# Patient Record
Sex: Male | Born: 1975 | Race: White | Hispanic: No | Marital: Single | Smoking: Current every day smoker
Health system: Southern US, Community
[De-identification: ages and names within clinical notes are randomized; demographics above are authoritative.]

## PROBLEM LIST (undated history)

## (undated) DIAGNOSIS — K8020 Calculus of gallbladder without cholecystitis without obstruction: Secondary | ICD-10-CM

## (undated) DIAGNOSIS — F319 Bipolar disorder, unspecified: Secondary | ICD-10-CM

## (undated) DIAGNOSIS — F250 Schizoaffective disorder, bipolar type: Secondary | ICD-10-CM

## (undated) DIAGNOSIS — K859 Acute pancreatitis without necrosis or infection, unspecified: Secondary | ICD-10-CM

## (undated) DIAGNOSIS — J449 Chronic obstructive pulmonary disease, unspecified: Secondary | ICD-10-CM

## (undated) DIAGNOSIS — F259 Schizoaffective disorder, unspecified: Secondary | ICD-10-CM

## (undated) HISTORY — PX: APPENDECTOMY: SHX54

## (undated) HISTORY — PX: MULTIPLE TOOTH EXTRACTIONS: SHX2053

## (undated) HISTORY — DX: Acute pancreatitis without necrosis or infection, unspecified: K85.90

---

## 2003-11-03 ENCOUNTER — Inpatient Hospital Stay (HOSPITAL_COMMUNITY): Admission: EM | Admit: 2003-11-03 | Discharge: 2003-11-11 | Payer: Self-pay | Admitting: Psychiatry

## 2004-02-02 ENCOUNTER — Ambulatory Visit: Payer: Self-pay | Admitting: Psychiatry

## 2004-02-02 ENCOUNTER — Inpatient Hospital Stay (HOSPITAL_COMMUNITY): Admission: AD | Admit: 2004-02-02 | Discharge: 2004-02-12 | Payer: Self-pay | Admitting: Psychiatry

## 2005-03-26 ENCOUNTER — Encounter: Admission: RE | Admit: 2005-03-26 | Discharge: 2005-04-22 | Payer: Self-pay | Admitting: Family Medicine

## 2005-04-23 ENCOUNTER — Encounter: Admission: RE | Admit: 2005-04-23 | Discharge: 2005-04-25 | Payer: Self-pay | Admitting: Family Medicine

## 2006-01-05 ENCOUNTER — Emergency Department (HOSPITAL_COMMUNITY): Admission: EM | Admit: 2006-01-05 | Discharge: 2006-01-05 | Payer: Self-pay | Admitting: Emergency Medicine

## 2006-06-28 ENCOUNTER — Emergency Department (HOSPITAL_COMMUNITY): Admission: EM | Admit: 2006-06-28 | Discharge: 2006-06-28 | Payer: Self-pay | Admitting: Emergency Medicine

## 2006-07-10 ENCOUNTER — Emergency Department (HOSPITAL_COMMUNITY): Admission: EM | Admit: 2006-07-10 | Discharge: 2006-07-11 | Payer: Self-pay | Admitting: Emergency Medicine

## 2006-07-11 ENCOUNTER — Ambulatory Visit: Payer: Self-pay | Admitting: Internal Medicine

## 2006-07-11 HISTORY — PX: ESOPHAGOGASTRODUODENOSCOPY: SHX1529

## 2006-08-08 ENCOUNTER — Emergency Department (HOSPITAL_COMMUNITY): Admission: EM | Admit: 2006-08-08 | Discharge: 2006-08-08 | Payer: Self-pay | Admitting: Emergency Medicine

## 2007-06-25 ENCOUNTER — Emergency Department (HOSPITAL_COMMUNITY): Admission: EM | Admit: 2007-06-25 | Discharge: 2007-06-25 | Payer: Self-pay | Admitting: Emergency Medicine

## 2007-06-27 ENCOUNTER — Emergency Department (HOSPITAL_COMMUNITY): Admission: EM | Admit: 2007-06-27 | Discharge: 2007-06-27 | Payer: Self-pay | Admitting: Emergency Medicine

## 2007-07-23 ENCOUNTER — Emergency Department (HOSPITAL_COMMUNITY): Admission: EM | Admit: 2007-07-23 | Discharge: 2007-07-23 | Payer: Self-pay | Admitting: Emergency Medicine

## 2007-09-20 ENCOUNTER — Emergency Department (HOSPITAL_COMMUNITY): Admission: EM | Admit: 2007-09-20 | Discharge: 2007-09-20 | Payer: Self-pay | Admitting: Emergency Medicine

## 2008-06-20 ENCOUNTER — Emergency Department (HOSPITAL_COMMUNITY): Admission: EM | Admit: 2008-06-20 | Discharge: 2008-06-21 | Payer: Self-pay | Admitting: Emergency Medicine

## 2008-07-05 ENCOUNTER — Ambulatory Visit (HOSPITAL_COMMUNITY): Admission: RE | Admit: 2008-07-05 | Discharge: 2008-07-05 | Payer: Self-pay | Admitting: Preventative Medicine

## 2009-02-21 ENCOUNTER — Emergency Department (HOSPITAL_COMMUNITY): Admission: EM | Admit: 2009-02-21 | Discharge: 2009-02-21 | Payer: Self-pay | Admitting: Emergency Medicine

## 2009-03-01 ENCOUNTER — Emergency Department (HOSPITAL_COMMUNITY): Admission: EM | Admit: 2009-03-01 | Discharge: 2009-03-01 | Payer: Self-pay | Admitting: Emergency Medicine

## 2009-07-04 ENCOUNTER — Emergency Department (HOSPITAL_COMMUNITY): Admission: EM | Admit: 2009-07-04 | Discharge: 2009-07-04 | Payer: Self-pay | Admitting: Emergency Medicine

## 2009-07-18 ENCOUNTER — Emergency Department (HOSPITAL_COMMUNITY): Admission: EM | Admit: 2009-07-18 | Discharge: 2009-07-19 | Payer: Self-pay | Admitting: Emergency Medicine

## 2009-07-26 ENCOUNTER — Emergency Department (HOSPITAL_COMMUNITY): Admission: EM | Admit: 2009-07-26 | Discharge: 2009-07-26 | Payer: Self-pay | Admitting: Emergency Medicine

## 2009-12-24 ENCOUNTER — Emergency Department (HOSPITAL_COMMUNITY): Admission: EM | Admit: 2009-12-24 | Discharge: 2009-12-24 | Payer: Self-pay | Admitting: Emergency Medicine

## 2010-01-17 ENCOUNTER — Emergency Department (HOSPITAL_COMMUNITY)
Admission: EM | Admit: 2010-01-17 | Discharge: 2010-01-17 | Payer: Self-pay | Source: Home / Self Care | Admitting: Emergency Medicine

## 2010-02-25 ENCOUNTER — Emergency Department (HOSPITAL_COMMUNITY): Admission: EM | Admit: 2010-02-25 | Discharge: 2010-02-25 | Payer: Self-pay | Admitting: Emergency Medicine

## 2010-05-09 ENCOUNTER — Emergency Department (HOSPITAL_COMMUNITY)
Admission: EM | Admit: 2010-05-09 | Discharge: 2010-05-09 | Disposition: A | Discharge status: Home / Self Care | Payer: Medicare Other | Attending: Emergency Medicine | Admitting: Emergency Medicine

## 2010-05-09 DIAGNOSIS — R51 Headache: Secondary | ICD-10-CM | POA: Insufficient documentation

## 2010-06-21 LAB — LIPASE, BLOOD: Lipase: 68 U/L — ABNORMAL HIGH (ref 11–59)

## 2010-06-21 LAB — COMPREHENSIVE METABOLIC PANEL
ALT: 11 U/L (ref 0–53)
BUN: 6 mg/dL (ref 6–23)
CO2: 28 mEq/L (ref 19–32)
Calcium: 8.7 mg/dL (ref 8.4–10.5)
Calcium: 8.8 mg/dL (ref 8.4–10.5)
Chloride: 107 mEq/L (ref 96–112)
Creatinine, Ser: 1.1 mg/dL (ref 0.4–1.5)
GFR calc non Af Amer: >60 mL/min (ref >60)
Glucose, Bld: 100 mg/dL — ABNORMAL HIGH (ref 70–99)
Glucose, Bld: 97 mg/dL (ref 70–99)
Sodium: 137 mEq/L (ref 135–145)
Sodium: 139 mEq/L (ref 135–145)
Total Protein: 6.3 g/dL (ref 6.0–8.3)
Total Protein: 6.7 g/dL (ref 6.0–8.3)

## 2010-06-21 LAB — CBC
HCT: 41.5 % (ref 39.0–52.0)
Hemoglobin: 14.7 g/dL (ref 13.0–17.0)
MCH: 32.4 pg (ref 26.0–34.0)
MCH: 32.6 pg (ref 26.0–34.0)
MCHC: 35.2 g/dL (ref 30.0–36.0)
MCHC: 35.3 g/dL (ref 30.0–36.0)
MCV: 92.3 fL (ref 78.0–100.0)
Platelets: 131 10*3/uL — ABNORMAL LOW (ref 150–400)
RBC: 4.5 MIL/uL (ref 4.22–5.81)

## 2010-06-21 LAB — DIFFERENTIAL
Eosinophils Absolute: 0.1 10*3/uL (ref 0.0–0.7)
Eosinophils Absolute: 0.1 10*3/uL (ref 0.0–0.7)
Eosinophils Relative: 2 % (ref 0–5)
Lymphocytes Relative: 21 % (ref 12–46)
Lymphocytes Relative: 27 % (ref 12–46)
Lymphs Abs: 1.4 10*3/uL (ref 0.7–4.0)
Lymphs Abs: 2 10*3/uL (ref 0.7–4.0)
Monocytes Relative: 10 % (ref 3–12)
Neutro Abs: 4.5 10*3/uL (ref 1.7–7.7)
Neutrophils Relative %: 62 % (ref 43–77)
Neutrophils Relative %: 66 % (ref 43–77)

## 2010-06-21 LAB — URINALYSIS, ROUTINE W REFLEX MICROSCOPIC
Bilirubin Urine: NEGATIVE
Hgb urine dipstick: NEGATIVE
Ketones, ur: NEGATIVE mg/dL
Protein, ur: NEGATIVE mg/dL
Urobilinogen, UA: 0.2 mg/dL (ref 0.0–1.0)
pH: 6.5 (ref 5.0–8.0)

## 2010-06-27 LAB — DIFFERENTIAL
Basophils Absolute: 0.1 10*3/uL (ref 0.0–0.1)
Basophils Relative: 0 % (ref 0–1)
Eosinophils Absolute: 0.1 10*3/uL (ref 0.0–0.7)
Eosinophils Relative: 1 % (ref 0–5)
Lymphocytes Relative: 12 % (ref 12–46)
Lymphs Abs: 1.8 10*3/uL (ref 0.7–4.0)
Monocytes Absolute: 0.9 10*3/uL (ref 0.1–1.0)
Monocytes Relative: 6 % (ref 3–12)
Neutro Abs: 12.2 10*3/uL — ABNORMAL HIGH (ref 1.7–7.7)
Neutrophils Relative %: 81 % — ABNORMAL HIGH (ref 43–77)

## 2010-06-27 LAB — BASIC METABOLIC PANEL
BUN: 7 mg/dL (ref 6–23)
CO2: 28 mEq/L (ref 19–32)
Calcium: 8.6 mg/dL (ref 8.4–10.5)
Chloride: 105 mEq/L (ref 96–112)
Creatinine, Ser: 1.06 mg/dL (ref 0.4–1.5)
GFR calc Af Amer: >60 mL/min (ref >60)
GFR calc non Af Amer: >60 mL/min (ref >60)
Glucose, Bld: 93 mg/dL (ref 70–99)
Potassium: 3.4 mEq/L — ABNORMAL LOW (ref 3.5–5.1)
Sodium: 139 mEq/L (ref 135–145)

## 2010-06-27 LAB — CBC
Hemoglobin: 15.4 g/dL (ref 13.0–17.0)
MCHC: 36.3 g/dL — ABNORMAL HIGH (ref 30.0–36.0)
MCV: 88.5 fL (ref 78.0–100.0)
RBC: 4.77 MIL/uL (ref 4.22–5.81)
WBC: 15.1 10*3/uL — ABNORMAL HIGH (ref 4.0–10.5)

## 2010-07-01 LAB — URINALYSIS, ROUTINE W REFLEX MICROSCOPIC
Bilirubin Urine: NEGATIVE
Glucose, UA: NEGATIVE mg/dL
Hgb urine dipstick: NEGATIVE
Protein, ur: NEGATIVE mg/dL
Specific Gravity, Urine: 1.025 (ref 1.005–1.030)

## 2010-07-11 LAB — URINALYSIS, ROUTINE W REFLEX MICROSCOPIC
Glucose, UA: NEGATIVE mg/dL
Hgb urine dipstick: NEGATIVE
Leukocytes, UA: NEGATIVE
pH: 6.5 (ref 5.0–8.0)

## 2010-07-11 LAB — URINE MICROSCOPIC-ADD ON

## 2010-07-19 LAB — DIFFERENTIAL
Basophils Absolute: 0.1 10*3/uL (ref 0.0–0.1)
Lymphocytes Relative: 15 % (ref 12–46)
Lymphs Abs: 1.7 10*3/uL (ref 0.7–4.0)
Monocytes Absolute: 0.7 10*3/uL (ref 0.1–1.0)
Monocytes Relative: 6 % (ref 3–12)
Neutro Abs: 9.4 10*3/uL — ABNORMAL HIGH (ref 1.7–7.7)

## 2010-07-19 LAB — CBC
Hemoglobin: 14.7 g/dL (ref 13.0–17.0)
RBC: 4.47 MIL/uL (ref 4.22–5.81)
RDW: 12 % (ref 11.5–15.5)
WBC: 12 10*3/uL — ABNORMAL HIGH (ref 4.0–10.5)

## 2010-07-19 LAB — BASIC METABOLIC PANEL
Calcium: 8.5 mg/dL (ref 8.4–10.5)
GFR calc Af Amer: >60 mL/min (ref >60)
GFR calc non Af Amer: >60 mL/min (ref >60)
Sodium: 137 mEq/L (ref 135–145)

## 2010-07-19 LAB — URINALYSIS, ROUTINE W REFLEX MICROSCOPIC
Bilirubin Urine: NEGATIVE
Nitrite: NEGATIVE
Specific Gravity, Urine: 1.01 (ref 1.005–1.030)
pH: 5.5 (ref 5.0–8.0)
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
## 2010-08-24 NOTE — Op Note (Signed)
Isaiah Peterson, Isaiah Peterson NO.:  [id number]   MEDICAL RECORD NO.:  [id number]          PATIENT TYPE:  EMS   LOCATION:  ED                            FACILITY:  [facility]   PHYSICIAN:  R. Roetta Sessions, M.D. DATE OF BIRTH:  August 03, 1975   DATE OF PROCEDURE:  07/11/2006  DATE OF DISCHARGE:                               OPERATIVE REPORT   PROCEDURE:  Emergency esophagogastroduodenoscopy with removal of foreign  body (chicken bone).   INDICATIONS FOR PROCEDURE:  Patient is a 35 year old Caucasian male who  went to [employer] this evening and started eating his chicken meal.  He  was eating a breast and early into the meal, he swallowed something that  felt like a bone.  He has had a sensation in his lower cervical area  ever since.  He presents to the ED.  He saw Dr. Margretta Ditty.  Plain films  and CT of the neck demonstrated a 3 cm apparent bone sitting diagonally  in the proximal esophagus.  Dr. Margretta Ditty called me.  I came to see the  patient.  He is now to undergo an emergency EGD.  This approach has been  discussed with the patient at length.  Potential risks, benefits and  alternatives have been reviewed and questions answered.  He is  agreeable.  Please see documentation on the medical record.   PROCEDURE NOTE:  O2 saturation, blood pressure, pulses, and respirations  were monitored throughout the entire procedure.  Conscious sedation with  Versed 4 mg IV and Demerol 75 mg IV in divided doses.   INSTRUMENT:  Olympus video chip system.   FINDINGS:  Examination of the tubular esophagus revealed a chicken bone  lying diagonally and somewhat impaled in the mucosa at both ends.  Please see photos.  The chicken bone was freed up by grabbing with a  Roth net.  It fell into the stomach.  There was a fair amount of food  debris in the stomach, and the food debris had to be suctioned out to  find the chicken bone.  Using a Lucina Mellow net, it was grasped, and it was  pulled out of the  patient intact.  A complete examination of the upper  GI tract was not done, given the presence of food in the stomach and the  indications for the procedure.  There were some excoriations on opposite  walls of the proximal esophagus, but I did not see anything that looked  like a through-and-through hole.  There is minimal bleeding with this  maneuver.  Patient tolerated the procedure and was reactive.   ENDOSCOPY IMPRESSION:  Esophageal foreign body (chicken bone), status  post removal, as described above.  Complete esophagogastroduodenoscopy  now carried out.   RECOMMENDATIONS:  Carafate 1 gm slurry q.i.d. x3 days.   I suspect [name] will do just fine.      [name], M.D.  Electronically Signed     RMR/MEDQ  D:  07/11/2006  T:  07/11/2006  Job:  [id number]   cc:   Alfredia Client, MD  Fax: [phone]
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
## 2010-09-24 ENCOUNTER — Emergency Department (HOSPITAL_COMMUNITY)
Admission: EM | Admit: 2010-09-24 | Discharge: 2010-09-24 | Disposition: A | Discharge status: Other Acute Inpatient Hospital | Payer: Medicare Other | Source: Home / Self Care | Attending: Emergency Medicine | Admitting: Emergency Medicine

## 2010-09-24 DIAGNOSIS — E039 Hypothyroidism, unspecified: Secondary | ICD-10-CM | POA: Insufficient documentation

## 2010-09-24 DIAGNOSIS — G40802 Other epilepsy, not intractable, without status epilepticus: Secondary | ICD-10-CM | POA: Insufficient documentation

## 2010-09-24 DIAGNOSIS — F3130 Bipolar disorder, current episode depressed, mild or moderate severity, unspecified: Secondary | ICD-10-CM | POA: Insufficient documentation

## 2010-09-24 DIAGNOSIS — IMO0001 Reserved for inherently not codable concepts without codable children: Secondary | ICD-10-CM | POA: Insufficient documentation

## 2010-09-24 DIAGNOSIS — R45851 Suicidal ideations: Secondary | ICD-10-CM | POA: Insufficient documentation

## 2010-09-24 DIAGNOSIS — R443 Hallucinations, unspecified: Secondary | ICD-10-CM | POA: Insufficient documentation

## 2010-09-24 DIAGNOSIS — R45850 Homicidal ideations: Secondary | ICD-10-CM | POA: Insufficient documentation

## 2010-09-24 DIAGNOSIS — Z8659 Personal history of other mental and behavioral disorders: Secondary | ICD-10-CM | POA: Insufficient documentation

## 2010-09-24 LAB — CBC
Hemoglobin: 14.5 g/dL (ref 13.0–17.0)
MCH: 31.5 pg (ref 26.0–34.0)
RBC: 4.61 MIL/uL (ref 4.22–5.81)

## 2010-09-24 LAB — COMPREHENSIVE METABOLIC PANEL
Albumin: 4.2 g/dL (ref 3.5–5.2)
BUN: 8 mg/dL (ref 6–23)
Calcium: 9.7 mg/dL (ref 8.4–10.5)
Creatinine, Ser: 1.07 mg/dL (ref 0.50–1.35)
Potassium: 3.7 mEq/L (ref 3.5–5.1)
Total Protein: 7.2 g/dL (ref 6.0–8.3)

## 2010-09-24 LAB — RAPID URINE DRUG SCREEN, HOSP PERFORMED
Amphetamines: NOT DETECTED
Cocaine: NOT DETECTED
Opiates: NOT DETECTED
Tetrahydrocannabinol: POSITIVE — AB

## 2010-09-24 LAB — ETHANOL: Alcohol, Ethyl (B): <11 mg/dL (ref 0–11)

## 2010-09-25 ENCOUNTER — Inpatient Hospital Stay (HOSPITAL_COMMUNITY)
Admission: RE | Admit: 2010-09-25 | Discharge: 2010-10-01 | DRG: 885 | Disposition: A | Discharge status: Home / Self Care | Payer: Medicare Other | Source: Ambulatory Visit | Attending: Psychiatry | Admitting: Psychiatry

## 2010-09-25 DIAGNOSIS — Z9119 Patient's noncompliance with other medical treatment and regimen: Secondary | ICD-10-CM

## 2010-09-25 DIAGNOSIS — Z91199 Patient's noncompliance with other medical treatment and regimen due to unspecified reason: Secondary | ICD-10-CM

## 2010-09-25 DIAGNOSIS — F200 Paranoid schizophrenia: Principal | ICD-10-CM

## 2010-09-25 DIAGNOSIS — F209 Schizophrenia, unspecified: Secondary | ICD-10-CM

## 2010-10-02 NOTE — H&P (Signed)
  NAMEIRELAND, CHAGNON NO.:  [id number]  MEDICAL RECORD NO.:  [id number]  LOCATION:  0407                          FACILITY:  BH  PHYSICIAN:  Eulogio Ditch, MD      DATE OF BIRTH:  DATE OF ADMISSION:  09/25/2010 DATE OF DISCHARGE:                      PSYCHIATRIC ADMISSION ASSESSMENT   HISTORY OF PRESENT ILLNESS:  This is a 35 year old male who that is involuntarily petitioned on 09/24/2010.  The patient is here on petition. The paper states the patient wanted to harm himself.  He has been thinking about making a bomb.  He has been hearing voices.  He was initially seen at [facility] and was sent for further evaluation.  The patient denies any specific  stressors.  He does report that he has been off his meds for some period time, endorsing voices non command type.  PAST PSYCHIATRIC HISTORY:  The patient has been here prior.  He is a client of  [facility] and has a history of  schizophrenia.  The patient has an admission to the [facility] in 2005 when he was psychotic, hearing voices telling him to kill people.  SOCIAL HISTORY:  The patient is single, he lives with a girlfriend who lives in [place],  he is on disability.  FAMILY HISTORY:  None.  ALCOHOL AND DRUG HISTORY:  The patient smokes marijuana, denies any other substance use.  PRIMARY CARE PHYSICIAN:  None.  MEDICAL PROBLEMS:  Denies any acute or chronic health issues.  MEDICATIONS:  None.  ALLERGIES:  No known drug allergies.  PHYSICAL EXAMINATION:  Physical examination is reviewed from the emergency department. This is a normally developed male who appears in no distress.  He offers no complaints.  His urine drug screen is positive for THC. CBC is within normal limits.  MENTAL STATUS EXAM:  The patient is in bed and resting but easily awakened and willing to answer questions.  He has fair eye contact.  He is somewhat disheveled.  Speech is clear, polite.  Mood is  depressed.  He denies any homicidal thoughts.  He is hearing voices he states on and off but non command type.  No thoughts of homicidal ideation.  No delusional thinking and he does not appear to be actively responding.  ASSESSMENT:  AXIS I:  Schizophrenia, undifferentiated, paranoid type. AXIS II:  Deferred. AXIS III:  No known medical conditions. AXIS IV:  Psychosocial problems related to chronic mental illness. AXIS V:  Current is 25.   Our plan is to initiate Risperdal.  Reinforce med compliance, continue to assess other comorbidities, contact his support group for safety concerns and returning to living situation.  Will also address his substance use.  His tentative length of stay at this time is 3-5 days.     Landry Corporal, N.P.   ______________________________ Eulogio Ditch, MD    JO/MEDQ  D:  09/25/2010  T:  09/25/2010  Job:  [id number]  Electronically Signed by Limmie Patricia.P. on 09/26/2010 09:33:15 AM Electronically Signed by Eulogio Ditch  on 10/02/2010 12:08:55 PM

## 2010-10-02 NOTE — Discharge Summary (Signed)
  NAMEJACKSEN, ISIP NO.:  [id number]  MEDICAL RECORD NO.:  [id number]  LOCATION:  0404                          FACILITY:  BH  PHYSICIAN:  Eulogio Ditch, MD DATE OF BIRTH:  Apr 16, 1975  DATE OF ADMISSION:  09/25/2010 DATE OF DISCHARGE:  10/01/2010                              DISCHARGE SUMMARY   HISTORY OF PRESENT ILLNESS:  35 year old male who was admitted on IVC as he was hearing voices and was not taking care of himself.  At the time of admission, the voices were noncommand type, and he denied any suicidal ideations.  HOSPITAL COURSE:  The patient has a history of schizophrenia, but he was noncompliant with his medication at the time of admission.  The patient was started on Risperdal which was slowly increased to 4 mg, but the patient started having side effects like EPS from it, and he was put on Cogentin, and Risperdal was reduced to 2 mg.  On Monday when I saw the patient, the patient was very logical and goal directed.  Denied hearing any voices.  Denied any suicidal or homicidal ideations.  He was alert, awake, oriented x3.  His insight and judgment improved, and he agreed to take his medications regularly.  During the hospital stay, no agitation was reported by staff.  He participated in all the groups.  We also contacted the patient's fiancee, and she did not have any safety concerns for the patient at the time of discharge, and she also reported that she has taken care of and secured all the weapons at home.  DIAGNOSIS AT THE TIME OF DISCHARGE:  Axis I:  Schizophrenia, paranoid type. Axis II:  Deferred. Axis III:  No active medical issue. Axis IV:  Chronic mental health issues. Axis V:  55.  DISCHARGE MEDICATIONS: 1. Risperdal 2 mg p.o. daily. 2. Cogentin 1 mg at bedtime as needed.  DISCHARGE FOLLOWUP:  The patient will follow up at [facility], phone number [phone], appointment is June 27 at a.m.     Eulogio Ditch,  MD     SA/MEDQ  D:  10/01/2010  T:  10/01/2010  Job:  [id number]  Electronically Signed by Eulogio Ditch  on 10/02/2010 12:08:59 PM

## 2010-10-20 IMAGING — CR DG KNEE COMPLETE 4+V*L*
4 series · 4 of 4 positions shown · non-contrast
Comparison: Left femur radiograph performed 06/20/2008

CLINICAL DATA: Left knee pain and swelling for 1 day.

LEFT KNEE - COMPLETE 4+ VIEW

[view not recorded (1 of 4)]
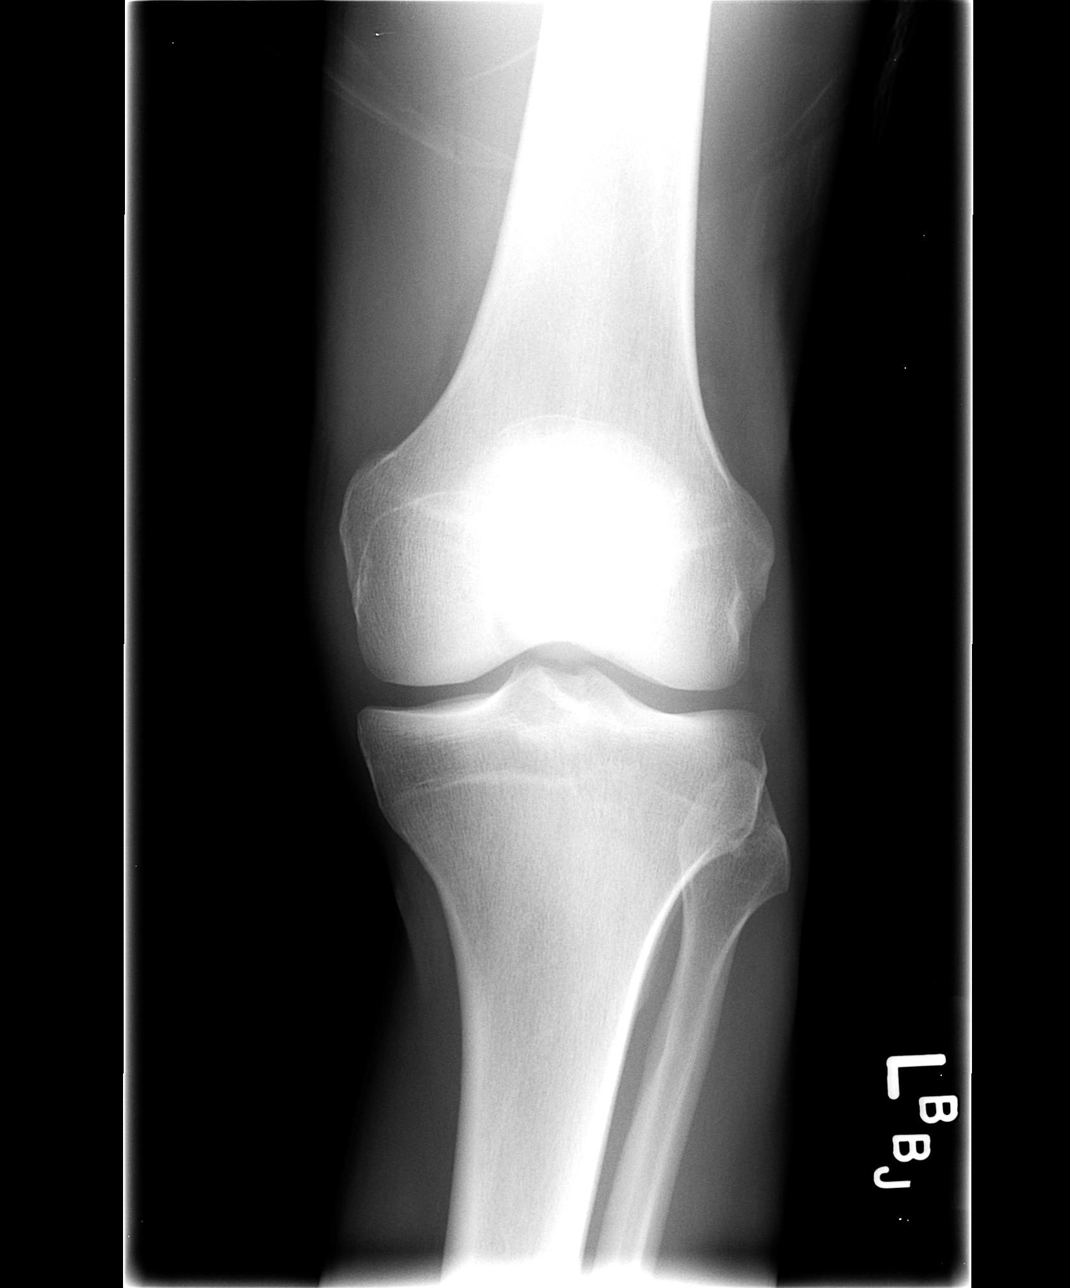

[view not recorded (2 of 4)]
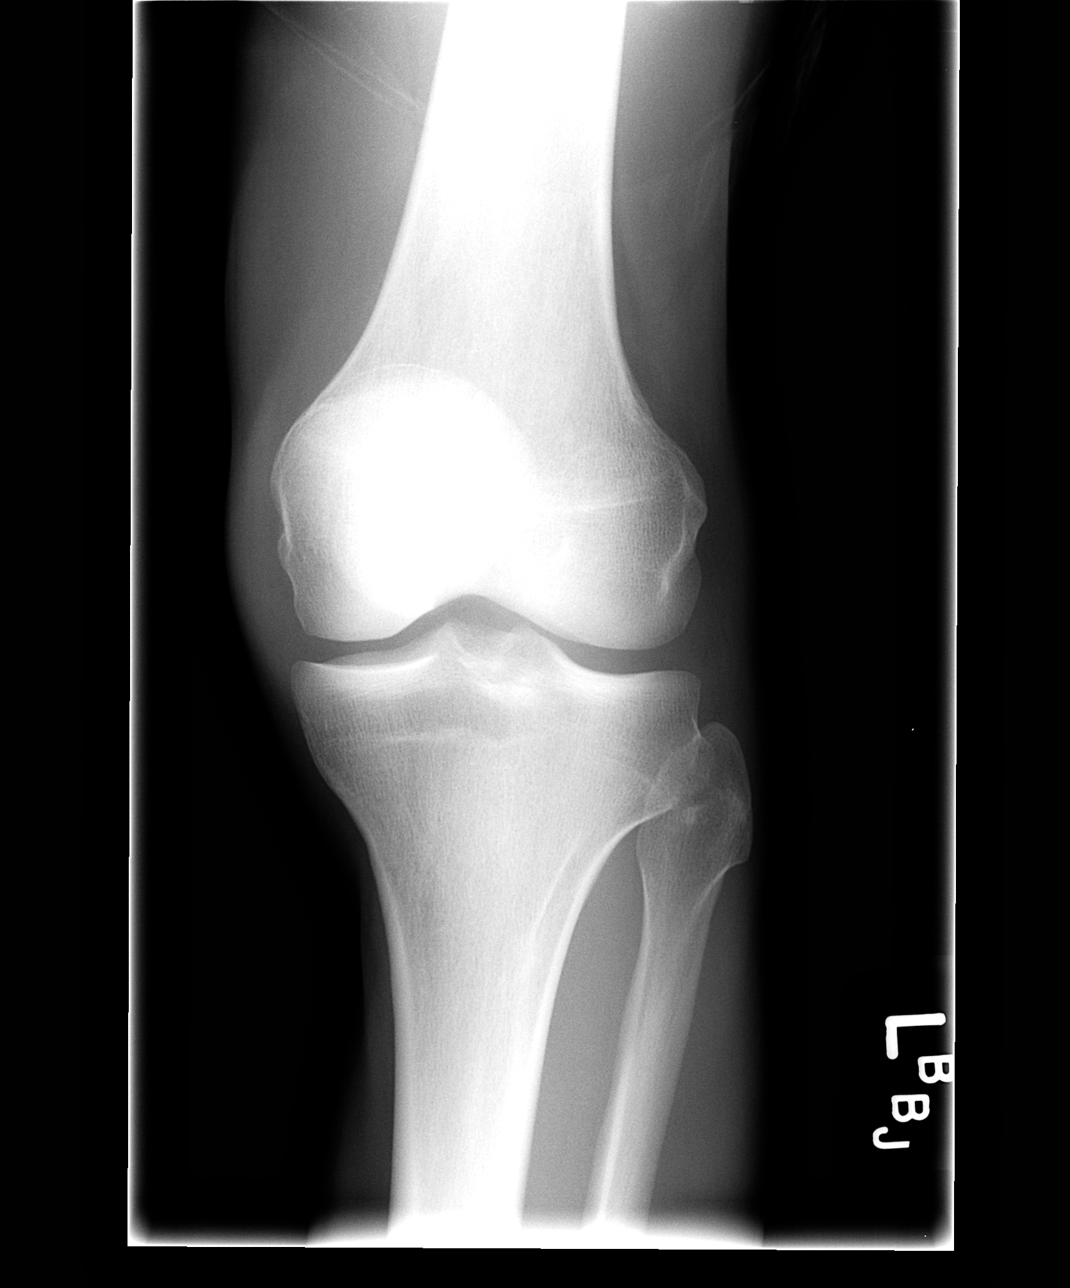

[view not recorded (3 of 4)]
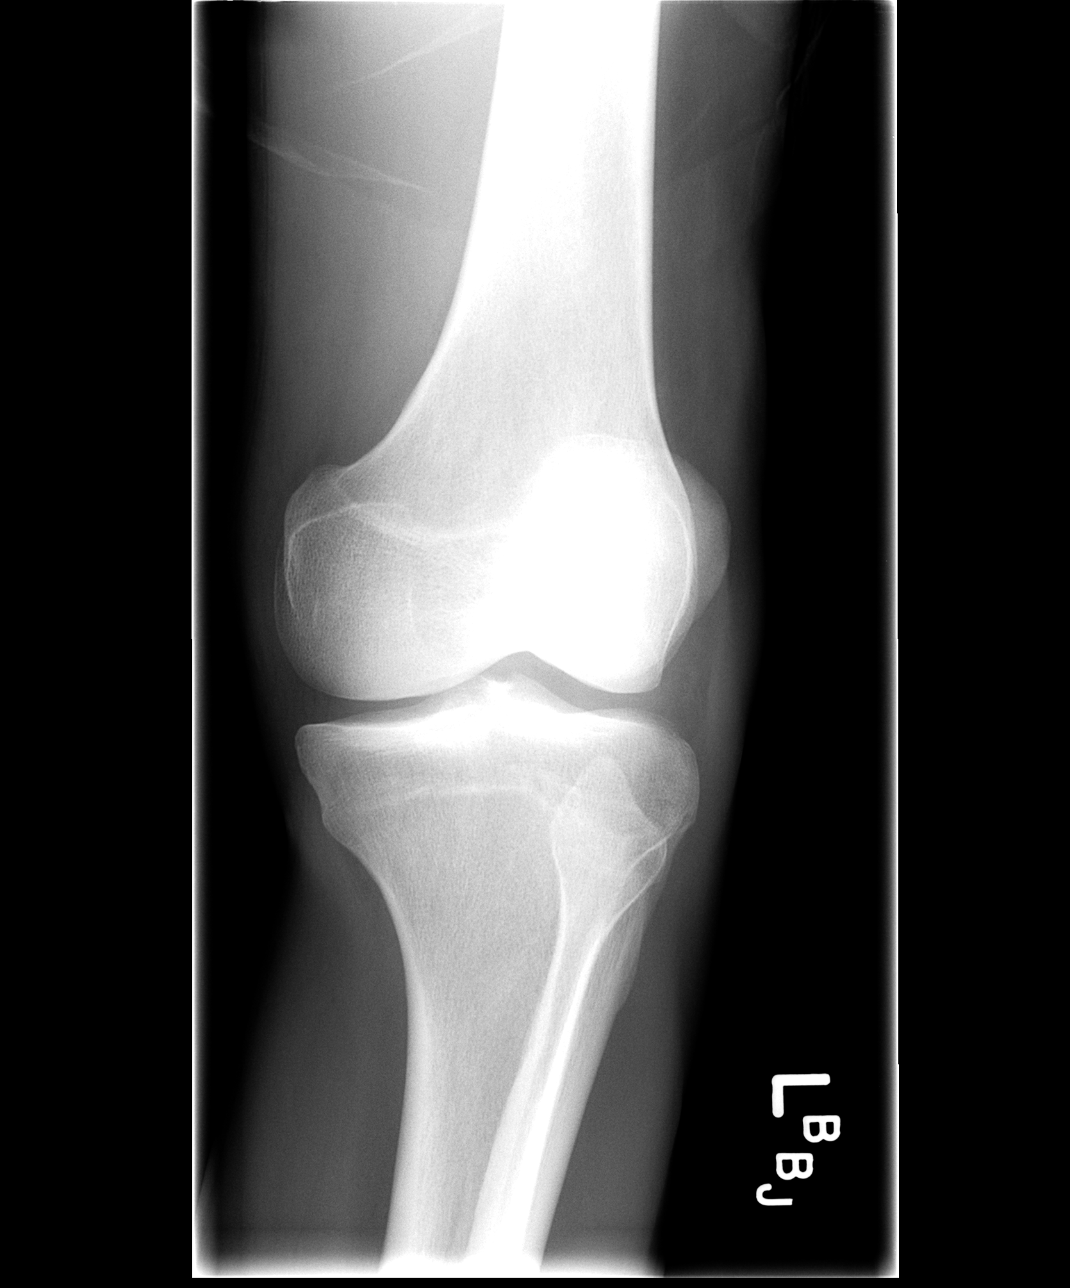

[view not recorded (4 of 4)]
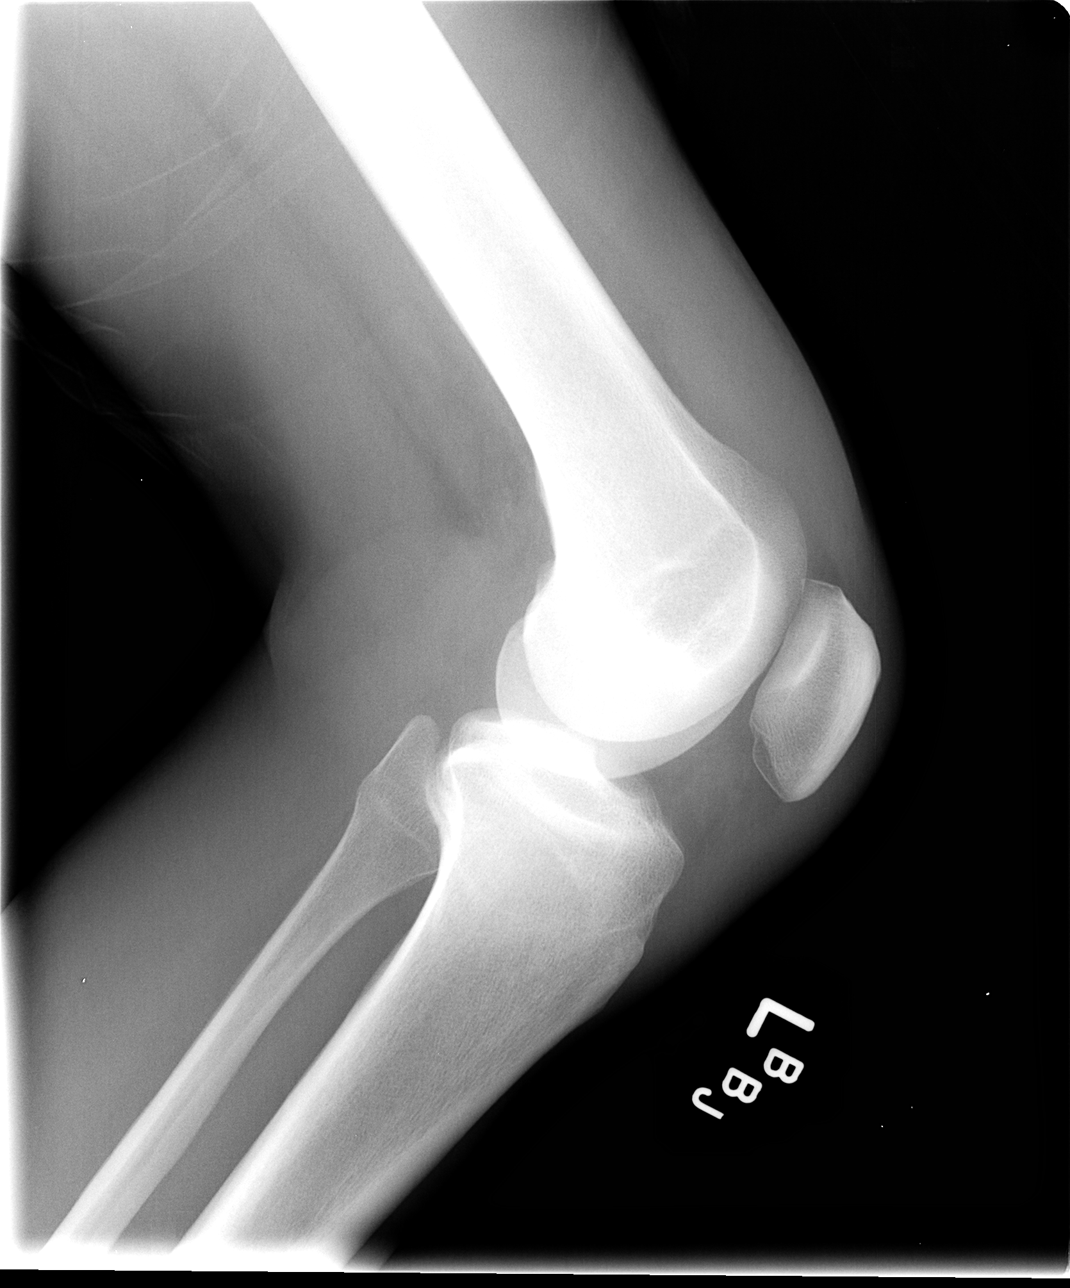

[4 of 4 positions shown; findings below may reference images not displayed]

FINDINGS: There is no evidence of fracture or dislocation.  The
joint spaces are preserved.  No significant degenerative change is
seen; the patellofemoral joint is grossly unremarkable in
appearance.

No significant joint effusion is seen.  The visualized soft tissues
are normal in appearance.
IMPRESSION: No evidence of fracture or dislocation.

## 2011-01-03 LAB — DIFFERENTIAL
Eosinophils Absolute: 0.1
Eosinophils Relative: 1
Lymphs Abs: 1.4
Monocytes Relative: 7

## 2011-01-03 LAB — CBC
HCT: 39.8
MCV: 89.3
RBC: 4.46
WBC: 10.3

## 2011-01-03 LAB — BASIC METABOLIC PANEL
Chloride: 109
GFR calc Af Amer: >60
Potassium: 3.9
Sodium: 141

## 2011-06-07 ENCOUNTER — Emergency Department (HOSPITAL_COMMUNITY)
Admission: EM | Admit: 2011-06-07 | Discharge: 2011-06-08 | Disposition: A | Discharge status: Nursing Home (Includes ICF) | Payer: 59 | Source: Home / Self Care | Attending: Emergency Medicine | Admitting: Emergency Medicine

## 2011-06-07 ENCOUNTER — Encounter (HOSPITAL_COMMUNITY): Payer: Self-pay | Admitting: Emergency Medicine

## 2011-06-07 DIAGNOSIS — R443 Hallucinations, unspecified: Secondary | ICD-10-CM | POA: Insufficient documentation

## 2011-06-07 DIAGNOSIS — F3289 Other specified depressive episodes: Secondary | ICD-10-CM | POA: Insufficient documentation

## 2011-06-07 DIAGNOSIS — R45851 Suicidal ideations: Secondary | ICD-10-CM | POA: Insufficient documentation

## 2011-06-07 DIAGNOSIS — F329 Major depressive disorder, single episode, unspecified: Secondary | ICD-10-CM | POA: Insufficient documentation

## 2011-06-07 DIAGNOSIS — Z8659 Personal history of other mental and behavioral disorders: Secondary | ICD-10-CM | POA: Insufficient documentation

## 2011-06-07 HISTORY — DX: Schizoaffective disorder, unspecified: F25.9

## 2011-06-07 HISTORY — DX: Schizoaffective disorder, bipolar type: F25.0

## 2011-06-07 HISTORY — DX: Bipolar disorder, unspecified: F31.9

## 2011-06-07 LAB — CBC
HCT: 42.9 % (ref 39.0–52.0)
MCH: 31.9 pg (ref 26.0–34.0)
MCV: 90.5 fL (ref 78.0–100.0)
Platelets: 137 10*3/uL — ABNORMAL LOW (ref 150–400)
RBC: 4.74 MIL/uL (ref 4.22–5.81)

## 2011-06-07 LAB — BASIC METABOLIC PANEL
Chloride: 103 mEq/L (ref 96–112)
GFR calc Af Amer: >90 mL/min (ref >90)
GFR calc non Af Amer: 86 mL/min — ABNORMAL LOW (ref >90)
Potassium: 4 mEq/L (ref 3.5–5.1)
Sodium: 139 mEq/L (ref 135–145)

## 2011-06-07 LAB — RAPID URINE DRUG SCREEN, HOSP PERFORMED
Barbiturates: NOT DETECTED
Benzodiazepines: NOT DETECTED
Tetrahydrocannabinol: POSITIVE — AB

## 2011-06-07 MED ORDER — IBUPROFEN 400 MG PO TABS: 600.0000 mg | ORAL_TABLET | Freq: Three times a day (TID) | ORAL | Status: DC | PRN

## 2011-06-07 MED ORDER — ALUM & MAG HYDROXIDE-SIMETH 200-200-20 MG/5ML PO SUSP: 30.0000 mL | ORAL | Status: DC | PRN

## 2011-06-07 MED ORDER — ACETAMINOPHEN 325 MG PO TABS: 650.0000 mg | ORAL_TABLET | ORAL | Status: DC | PRN

## 2011-06-07 MED ORDER — LORAZEPAM 1 MG PO TABS: 1.0000 mg | ORAL_TABLET | Freq: Three times a day (TID) | ORAL | Status: DC | PRN

## 2011-06-07 NOTE — BH Assessment (Addendum)
Assessment Note   Isaiah Peterson is an 36 y.o. male   PT  PRESENTED TO THE ER DUE TO DEPRESSION, AUDITORY HALLUCINATIONS, SUICIDAL IDEATIONS WITH PLAN AND HOMICIDAL IDEATIONS WITHOUT PLAN OR INTENT. PT REPORTS HE DID NOT GET HIS DISABILITY CHECK TODAY AS SCHEDULED ANS HE CALLED DISABILITY OFFICE TO CHECK ON HIS STATUS AND WAS TOLD HE NO LONGER QUALIFIES FOR DISABILITY. HE HAD NOT BEEN GIVEN PRIOR NOTIFICATION AND STANDS TO LOOSE HIS APARTMENT. HE GOT INTO AN ARGUMENT WITH HIS GIRLFRIEND'S  UNCLE ABOUT MONEY TO PAY BILLS, AS HIS UNCLE LIVES WITH HIM.  HIS  HAS NO MONEY NOW AND IS RENT OF 650.00 IS DUE.  HE IS STILL UPSET ABOUT HIS GIRLFRIEND'S FAMILY REPORTING THEM TO DSS LAST November AND DSS REMOVED HIS SON FROM THE HOME. HIS GIRLFRIEND IS NOW 6 MONTHS PREGNANT. HE REPORTS ONE BAD THING AFTER ANOTHER CONTINUES TO HAPPEN AND HE JUST MAY AS WELL FINISH OFF HIS LIFE. HE REPORTS HE ALWAYS HEARS VOICES IN HEAD BUT HAS LEARNED TO DEAL WITH IT AND IS ABLE TO DISTINGUISH BETWEEN WHAT IS REAL AND WHAT IS NOT. PT REPORTS HE HAS NOT EATEN IN 2 DAYS AND DOES NOT SLEEP AT NIGHT AT ALL BUT DOES SLEEP ABOUT 3 HOURS DURING THE DAY. HE REPORTS HIS SMOKING OF 2-3 JOINTS OF MARIJUANA HELPS WITH HIS RACING THOUGHTS AS HE STOPPED TAKING HIS MEDICATIONS DUE TO PHYSICAL PROBLEMS THEY CAUSED. PT IS CALM IN THE ER. HE IS A&O X 3 AND NO VISUAL HALLUCINATIONS.  Axis I: Major Depression, Recurrent severe, Schizophrenia Paranoid Type     Axis II: Deferred Axis III:  Past Medical History  Diagnosis Date  . Bipolar 1 disorder   . Schizophrenia, schizo-affective    Axis IV: economic problems, other psychosocial or environmental problems and problems with primary support group Axis V: 21-30 behavior considerably influenced by delusions or hallucinations OR serious impairment in judgment, communication OR inability to function in almost all areas    Past Medical History:  Past Medical History  Diagnosis Date  . Bipolar 1  disorder   . Schizophrenia, schizo-affective     Past Surgical History  Procedure Date  . Appendectomy     Family History: No family history on file.  Social History:  reports that he has been smoking.  He does not have any smokeless tobacco history on file. He reports that he uses illicit drugs (Marijuana). He reports that he does not drink alcohol.  Additional Social History:    Allergies: No Known Allergies  Home Medications:  Medications Prior to Admission  Medication Dose Route Frequency Provider Last Rate Last Dose  . acetaminophen (TYLENOL) tablet 650 mg  650 mg Oral Q4H PRN Celene Kras, MD      . alum & mag hydroxide-simeth (MAALOX/MYLANTA) 200-200-20 MG/5ML suspension 30 mL  30 mL Oral PRN Celene Kras, MD      . ibuprofen (ADVIL,MOTRIN) tablet 600 mg  600 mg Oral Q8H PRN Celene Kras, MD      . LORazepam (ATIVAN) tablet 1 mg  1 mg Oral Q8H PRN Celene Kras, MD       No current outpatient prescriptions on file as of 06/07/2011.    OB/GYN Status:  No LMP for male patient.  General Assessment Data Location of Assessment: [facility] ACT Assessment: Yes Living Arrangements: Spouse/significant other Can pt return to current living arrangement?: Yes Admission Status: Voluntary Is patient capable of signing voluntary admission?: Yes Transfer from: Acute  Hospital Referral Source: MD (DR Cletis [facility])  Education Status Contact person: AMY LEANN CARDEN-[phone] (GIRLFRIEND)  Risk to self Suicidal Ideation: Yes-Currently Present Suicidal Intent: Yes-Currently Present Is patient at risk for suicide?: Yes Suicidal Plan?: Yes-Currently Present Specify Current Suicidal Plan: BLOW HIS HEAD OFF WITH A FLARE GUN  Access to Means: Yes Specify Access to Suicidal Means: POLICE TOOK HIS FLARE GUN What has been your use of drugs/alcohol within the last 12 months?: MARIJUANA Previous Attempts/Gestures: Yes How many times?: 10  Other Self Harm Risks: NO Triggers for Past  Attempts: Other personal contacts;Hallucinations;Family contact Intentional Self Injurious Behavior: None Family Suicide History: No Recent stressful life event(s): Conflict (Comment);Financial Problems;Other (Comment) (LOSS OF DISABILITY) Persecutory voices/beliefs?: No Depression: Yes Depression Symptoms: Despondent;Loss of interest in usual pleasures;Feeling worthless/self pity;Feeling angry/irritable Substance abuse history and/or treatment for substance abuse?: Yes Suicide prevention information given to non-admitted patients: Not applicable  Risk to Others Homicidal Ideation: Yes-Currently Present Thoughts of Harm to Others: Yes-Currently Present Comment - Thoughts of Harm to Others: GIRLFRIEND'S FAMILY MEMBERS Current Homicidal Intent: No Current Homicidal Plan: No Access to Homicidal Means: No Identified Victim: GIRLFRIEND'S MOM AND OTHER FAMILY MEMBERS History of harm to others?: No Assessment of Violence: None Noted Violent Behavior Description: NA Does patient have access to weapons?: No Criminal Charges Pending?: No Does patient have a court date: No  Psychosis Hallucinations: Auditory Delusions: None noted  Mental Status Report Appear/Hygiene: Body odor Eye Contact: Good Motor Activity: Freedom of movement Speech: Logical/coherent Level of Consciousness: Alert Mood: Depressed;Helpless;Sad;Worthless, low self-esteem Affect: Appropriate to circumstance;Depressed;Sad;Irritable Anxiety Level: Minimal Thought Processes: Coherent;Relevant Judgement: Impaired Orientation: Person;Place;Time;Situation Obsessive Compulsive Thoughts/Behaviors: None  Cognitive Functioning Concentration: Normal Memory: Recent Intact;Remote Intact IQ: Average Insight: Poor Impulse Control: Poor Appetite: Poor Sleep: Decreased Total Hours of Sleep: 3  Vegetative Symptoms: None  Prior Inpatient Therapy Prior Inpatient Therapy: Yes Prior Therapy Dates: SINCE AGE 46 TO  CURRENT Prior Therapy Facilty/Provider(s): [facility], [facility] Reason for Treatment: SCHIZOPHRENIA, DEPRESSION   Prior Outpatient Therapy Prior Outpatient Therapy: Yes Prior Therapy Dates: AGE 46 TO 5 YEARS AGO Prior Therapy Facilty/Provider(s): [facility] Reason for Treatment: SCHIZOPHRENIA            Values / Beliefs Cultural Requests During Hospitalization: None Spiritual Requests During Hospitalization: None        Additional Information 1:1 In Past 12 Months?: No CIRT Risk: No Elopement Risk: No Does patient have medical clearance?: Yes     Disposition: REFERRED TO [facility].  PT ACCEPTED BY MAGGIE NWOKO,NP TO DR READLING ROOM 402-1. DR Bebe Shaggy AGREES WITH DISPOSITION. CALLED [facility], [phone],  FOR AUTHORIZATION. SPOKE WITH SANDRA-AUTHORIZATION # [id number].  Disposition Disposition of Patient: Inpatient treatment program Type of inpatient treatment program: Adult  On Site Evaluation by:  DR Linwood Dibbles Reviewed with Physician:     Hattie Perch Winford 06/07/2011 2:12 PM

## 2011-06-07 NOTE — ED Notes (Signed)
Pt here for si/hi. Pt brought in by [facility] for emergency commitment. Pt states hi is si/hi and was planning to "blow my head off with a 12 gauge". Pt states he is hi, but when asked if he has a plan states "no comment". Pt states "i just want to hurt certain people, not all people are bad people".

## 2011-06-07 NOTE — ED Notes (Signed)
Pt tolerated lunch meal well

## 2011-06-07 NOTE — ED Notes (Signed)
Pt eating meal tray, sitter at bedside.

## 2011-06-07 NOTE — ED Provider Notes (Addendum)
History   This chart was scribed for Celene Kras, MD by Sofie Rower. The patient was seen in room [facility] and the patient's care was started at 10:41AM.    CSN: [id number]  Arrival date & time 06/07/11  1010   First MD Initiated Contact with Patient 06/07/11 1023      Chief Complaint  Patient presents with  . Medical Clearance    (Consider location/radiation/quality/duration/timing/severity/associated sxs/prior treatment) HPI  Isaiah Peterson is a 36 y.o. male who presents to the Emergency Department complaining of severe, constant depression. Pt states "he feels like ending it". Pt had a recent break up with his girlfriend. Pt states he does not drink but smokes mariajuana. Pt states "he hears voices". The voices have become more active as of recently. Patient was considering shooting himself.  Pt denies any abd pain, swelling in the legs.  Past Medical History  Diagnosis Date  . Bipolar 1 disorder   . Schizophrenia, schizo-affective     Past Surgical History  Procedure Date  . Appendectomy     No family history on file.  History  Substance Use Topics  . Smoking status: Current Everyday Smoker  . Smokeless tobacco: Not on file  . Alcohol Use: No      Review of Systems  All other systems reviewed and are negative.    10 Systems reviewed and are negative for acute change except as noted in the HPI.   Allergies  Review of patient's allergies indicates no known allergies.  Home Medications  No current outpatient prescriptions on file.  BP 159/94  Pulse 85  Temp 97.9 F (36.6 C)  Resp 20  Ht 5\' 11"  (1.803 m)  Wt 130 lb (58.968 kg)  BMI 18.13 kg/m2  SpO2 100%  Physical Exam  Nursing note and vitals reviewed. Constitutional: He appears well-developed and well-nourished. No distress.       Malodorous, disheveled.  HENT:  Head: Normocephalic and atraumatic.  Right Ear: External ear normal.  Left Ear: External ear normal.       Poor dentition.     Eyes: Conjunctivae are normal. Right eye exhibits no discharge. Left eye exhibits no discharge. No scleral icterus.  Neck: Neck supple. No tracheal deviation present.  Cardiovascular: Normal rate, regular rhythm and intact distal pulses.   Pulmonary/Chest: Effort normal and breath sounds normal. No stridor. No respiratory distress. He has no wheezes. He has no rales.  Abdominal: Soft. Bowel sounds are normal. He exhibits no distension. There is no tenderness. There is no rebound and no guarding.  Musculoskeletal: He exhibits no edema and no tenderness.  Neurological: He is alert. He has normal strength. No sensory deficit. Cranial nerve deficit:  no gross defecits noted. He exhibits normal muscle tone. He displays no seizure activity. Coordination normal.  Skin: Skin is warm and dry. No rash noted.  Psychiatric: His mood appears not anxious. His affect is blunt. His affect is not angry. His speech is not rapid and/or pressured, not delayed and not slurred. He is withdrawn. He is not agitated. He exhibits a depressed mood. He expresses suicidal ideation. He expresses suicidal plans.    ED Course  Procedures (including critical care time)  Results for orders placed during the hospital encounter of 06/07/11  CBC      Component Value Range   WBC 5.8  4.0 - 10.5 (K/uL)   RBC 4.74  4.22 - 5.81 (MIL/uL)   Hemoglobin 15.1  13.0 - 17.0 (g/dL)   HCT  42.9  39.0 - 52.0 (%)   MCV 90.5  78.0 - 100.0 (fL)   MCH 31.9  26.0 - 34.0 (pg)   MCHC 35.2  30.0 - 36.0 (g/dL)   RDW 16.1  09.6 - 04.5 (%)   Platelets 137 (*) 150 - 400 (K/uL)  ETHANOL      Component Value Range   Alcohol, Ethyl (B) <11  0 - 11 (mg/dL)  URINE RAPID DRUG SCREEN (HOSP PERFORMED)      Component Value Range   Opiates NONE DETECTED  NONE DETECTED    Cocaine NONE DETECTED  NONE DETECTED    Benzodiazepines NONE DETECTED  NONE DETECTED    Amphetamines NONE DETECTED  NONE DETECTED    Tetrahydrocannabinol POSITIVE (*) NONE DETECTED     Barbiturates NONE DETECTED  NONE DETECTED   BASIC METABOLIC PANEL      Component Value Range   Sodium 139  135 - 145 (mEq/L)   Potassium 4.0  3.5 - 5.1 (mEq/L)   Chloride 103  96 - 112 (mEq/L)   CO2 28  19 - 32 (mEq/L)   Glucose, Bld 95  70 - 99 (mg/dL)   BUN 9  6 - 23 (mg/dL)   Creatinine, Ser 4.09  0.50 - 1.35 (mg/dL)   Calcium 9.4  8.4 - 81.1 (mg/dL)   GFR calc non Af Amer 86 (*) >90 (mL/min)   GFR calc Af Amer >90  >90 (mL/min)       10:43AM- EDP at bedside discusses treatment plan. MDM  Patient appears medically stable. Will have the act team assess the patient for psychiatric placement.    I personally performed the services described in this documentation, which was scribed in my presence.  The recorded information has been reviewed and considered.     Celene Kras, MD 06/07/11 1210  Celene Kras, MD 06/07/11 [phone]

## 2011-06-07 NOTE — ED Notes (Signed)
Pt brought to er by [facility], for eval of si/hi ideations,   Plan is to shoot himself in head.  Refused to answer who/how he will kill someone else. Calm at present. rpd officer signed off.  Hosp. Sitter at bedside

## 2011-06-07 NOTE — ED Notes (Signed)
Resting in bed, sitter at bedside. Voices no complaints

## 2011-06-07 NOTE — ED Notes (Signed)
Tolerated lunch meal well.  Sitter at bedside.

## 2011-06-08 ENCOUNTER — Encounter (HOSPITAL_COMMUNITY): Payer: Self-pay

## 2011-06-08 ENCOUNTER — Inpatient Hospital Stay (HOSPITAL_COMMUNITY)
Admission: AD | Admit: 2011-06-08 | Discharge: 2011-06-17 | DRG: 885 | Disposition: A | Discharge status: Home / Self Care | Payer: 59 | Source: Ambulatory Visit | Attending: Psychiatry | Admitting: Psychiatry

## 2011-06-08 DIAGNOSIS — IMO0002 Reserved for concepts with insufficient information to code with codable children: Secondary | ICD-10-CM

## 2011-06-08 DIAGNOSIS — R45850 Homicidal ideations: Secondary | ICD-10-CM

## 2011-06-08 DIAGNOSIS — F1220 Cannabis dependence, uncomplicated: Secondary | ICD-10-CM | POA: Diagnosis present

## 2011-06-08 DIAGNOSIS — F1210 Cannabis abuse, uncomplicated: Secondary | ICD-10-CM

## 2011-06-08 DIAGNOSIS — F200 Paranoid schizophrenia: Secondary | ICD-10-CM | POA: Diagnosis present

## 2011-06-08 DIAGNOSIS — F329 Major depressive disorder, single episode, unspecified: Secondary | ICD-10-CM

## 2011-06-08 DIAGNOSIS — F17200 Nicotine dependence, unspecified, uncomplicated: Secondary | ICD-10-CM

## 2011-06-08 DIAGNOSIS — R45851 Suicidal ideations: Secondary | ICD-10-CM

## 2011-06-08 LAB — URINALYSIS, ROUTINE W REFLEX MICROSCOPIC
Leukocytes, UA: NEGATIVE
Nitrite: NEGATIVE
Specific Gravity, Urine: 1.026 (ref 1.005–1.030)
Urobilinogen, UA: 2 mg/dL — ABNORMAL HIGH (ref 0.0–1.0)
pH: 7 (ref 5.0–8.0)

## 2011-06-08 MED ORDER — ACETAMINOPHEN 325 MG PO TABS: 650.0000 mg | ORAL_TABLET | Freq: Four times a day (QID) | ORAL | Status: DC | PRN

## 2011-06-08 MED ORDER — QUETIAPINE FUMARATE 300 MG PO TABS
300.0000 mg | ORAL_TABLET | Freq: Every day | ORAL | Status: DC
  Administered 2011-06-08 – 2011-06-09 (×2): 300 mg via ORAL
  Filled 2011-06-08 (×3): qty 1

## 2011-06-08 MED ORDER — DIVALPROEX SODIUM ER 500 MG PO TB24
500.0000 mg | ORAL_TABLET | Freq: Two times a day (BID) | ORAL | Status: DC
  Administered 2011-06-08 – 2011-06-10 (×5): 500 mg via ORAL
  Filled 2011-06-08 (×7): qty 1

## 2011-06-08 MED ORDER — ALUM & MAG HYDROXIDE-SIMETH 200-200-20 MG/5ML PO SUSP: 30.0000 mL | ORAL | Status: DC | PRN

## 2011-06-08 MED ORDER — MAGNESIUM HYDROXIDE 400 MG/5ML PO SUSP: 30.0000 mL | Freq: Every day | ORAL | Status: DC | PRN

## 2011-06-08 NOTE — Progress Notes (Signed)
[facility] Adult Inpatient Family/Significant Other Suicide Prevention Education  Suicide Prevention Education:  Education Completed; Amy Cardea-[phone](Pt.'s girlfriend) has been identified by the patient as the family member/significant other with whom the patient will be residing, and identified as the person(s) who will aid the patient in the event of a mental health crisis (suicidal ideations/suicide attempt).  With written consent from the patient, the family member/significant other has been provided the following suicide prevention education, prior to the and/or following the discharge of the patient.  The suicide prevention education provided includes the following:  Suicide risk factors  Suicide prevention and interventions  National Suicide Hotline telephone number  [facility] assessment telephone number  [facility] Emergency Assistance 911  [facility] and/or Residential Mobile Crisis Unit telephone number  Request made of family/significant other to:  Remove weapons (e.g., guns, rifles, knives), all items previously/currently identified as safety concern.  Pt.'s girlfriend states that there are no guns or weapons in the home.  Remove drugs/medications (over-the-counter, prescriptions, illicit drugs), all items previously/currently identified as a safety concern. Pt. reports  There are no medications or drugs in the home and will secure the home before the pt. comes home.   Pt.'s girlfriend states that the pt. Had not tried to harm himself or anyone else since they have been in a relationship for 6 years. Pt.'s girlfriend states the pt. will return home to live with the pt.  The family member/significant other verbalizes understanding of the suicide prevention education information provided.  The family member/significant other agrees to remove the items of safety concern listed above.  Neila Gear 06/08/2011, 2:59 PM

## 2011-06-08 NOTE — Progress Notes (Signed)
Pt isolates to his room  And is guarded and paranoid   He talked about medications and how he had a reaction to risperdal that made his jaw get tight and eyes roll up in his head and also to seroquel which he said gave him night sweats  He is pleasant on approach and cooperative   Verbal support given  Medications administered and effectiveness monitored  Q 15 min checks  Pt safe at present
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
## 2011-06-08 NOTE — Progress Notes (Signed)
Patient ID: Isaiah Peterson, male   DOB: 1976-01-21, 36 y.o.   MRN: [id number]  [facility] Group Notes:  (Counselor/Nursing/MHT/Case Management/Adjunct)  06/08/2011 11 AM  Type of Therapy:  Group Therapy, Dance/Movement Therapy   Participation Level:  Minimal  Participation Quality:  Attentive  Affect:  Depressed  Cognitive:  Oriented  Insight:  Limited  Engagement in Group:  Limited  Engagement in Therapy:  Limited  Modes of Intervention:  Clarification, Problem-solving, Role-play, Socialization and Support  Summary of Progress/Problems:  Therapist discussed the meaning of self sabotaging behaviors. Group discussed what self sabotage means to them and ways to prevent sabotaging behaviors in recovery.  Pt. stated that self sabotage means "feeling suicidal or homicidal ".  Pt. Stated that a preventative way of sabotaging recovery is" spending time with my three dogs".       Rhunette Croft

## 2011-06-08 NOTE — H&P (Signed)
  Pt was seen by me today and I agree with the key elements documented in H&P.  
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
## 2011-06-08 NOTE — Progress Notes (Signed)
Patient ID: Isaiah Peterson, male   DOB: 1975-06-21, 36 y.o.   MRN: [id number] Pt. attended and participated in aftercare planning group. Pt. accepted information on suicide prevention, warning signs to look for with suicide and crisis line numbers to use. The pt. agreed to call crisis line numbers if having warning signs or having thoughts of suicide. Pt. listed their current anxiety level as extremely high.  Pt. Indicated that he is having SI and HI thoughts sometimes.  He also indicated that he does know how to contract for safety.
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
## 2011-06-08 NOTE — ED Notes (Signed)
Patient  Remains asleep

## 2011-06-08 NOTE — [facility] Suicide Risk Assessment (Signed)
Suicide Risk Assessment  Admission Assessment     Demographic factors:   17 male Current Mental Status:     Objective: Appearance: Disheveled  Eye Contact:: Good  Speech: Clear and Coherent  Volume: Increased  Mood: Depressed, Irritable and Worthless  Affect: Flat  Thought Process: Coherent  Orientation: Full  Thought Content: Hallucinations: Auditory  Visual  Suicidal Thoughts: Yes. without intent/plan  Homicidal Thoughts: Yes. without intent/plan  Memory: Immediate; Good  Recent; Fair  Remote; Fair  Judgement: Impaired  Insight: Fair  Psychomotor Activity: Normal  Concentration: Fair  Recall: Fair  Akathisia: No   Loss Factors:   lost disability check, no social support Historical Factors:   multiple SI attempts, SI thought on admission Risk Reduction Factors:   unknown  CLINICAL FACTORS:   Schizophrenia:   Paranoid or undifferentiated type Currently Psychotic, cannabis abuse  COGNITIVE FEATURES THAT CONTRIBUTE TO RISK:  Loss of executive function    SUICIDE RISK:   Moderate: Frequent suicidal ideation with limited intensity, and duration, some specificity in terms of plans, no associated intent, good self-control, limited dysphoria/symptomatology, some risk factors present, and identifiable protective factors, including available and accessible social support.   PLAN OF CARE:   Admit for safety and stabilization.  Review and reinstate any pertinent home medications.  Obtain urinalysis.  Initiate Seroquel 300mg  mg q hs, and Depakote 500 mg bid.    Wonda Cerise 06/08/2011, 7:40 PM

## 2011-06-09 NOTE — Progress Notes (Signed)
Patient ID: MERCURY ROCK, male   DOB: 1976-01-07, 36 y.o.   MRN: [id number]  Subjective:  Seen today. Good sleep. Attending groups. Unable to tell what is going on in groups today in terms of topics. Says he is depressed. Homeless right now. Reports SI and AVH some times. Able to see spirits but it does not upset him.   MSE:  Objective: Appearance: Disheveled Eye Contact:: Good Speech: Clear and Coherent Volume: Increased Mood: Depressed, Irritable and Worthless Affect: Flat Thought Process: Coherent Orientation: Full Thought Content: Hallucinations: Auditory Visual Suicidal Thoughts: Yes. without intent/plan Homicidal Thoughts: Yes. without intent/plan Memory: poor Judgement: Impaired Insight:poor Psychomotor Activity: Normal Concentration: poor Recall: poor Akathisia: No   Dx:  AXIS I: Chronic Paranoid Schizophrenia, Cannabis Abuse AXIS II: Deferred   PLAN OF CARE:   urinalysis reviewed today Continue current meds Pending depakote level

## 2011-06-09 NOTE — Progress Notes (Signed)
Patient ID: Isaiah Peterson, male   DOB: 1975-09-01, 36 y.o.   MRN: [id number]  [facility] Group Notes:  (Counselor/Nursing/MHT/Case Management/Adjunct)  06/09/2011 11 AM  Type of Therapy:  Group Therapy, Dance/Movement Therapy   Participation Level:  Minimal  Participation Quality:  Drowsy  Affect:  Depressed  Cognitive:  Oriented  Insight:  Limited  Engagement in Group:  Limited  Engagement in Therapy:  Limited  Modes of Intervention:  Clarification, Problem-solving, Role-play, Socialization and Support  Summary of Progress/Problems:  Therapist discussed the definition of supports.  Therapist asked group to describe their idea of support.  Patient stated that support means, " taking care of my dogs".      Rhunette Croft

## 2011-06-09 NOTE — Progress Notes (Signed)
Patient ID: Isaiah Peterson, male   DOB: July 05, 1975, 36 y.o.   MRN: [id number] Pt. attended and participated in aftercare planning group. Pt. accepted information on suicide prevention, warning signs to look for with suicide and crisis line numbers to use. The pt. agreed to call crisis line numbers if having warning signs or having thoughts of suicide. Pt. listed their current anxiety level as high and depression as high.

## 2011-06-09 NOTE — Progress Notes (Signed)
Patient ID: Isaiah Peterson, male   DOB: [birth year], 36 y.o.   MRN: [id number] Has been walking and pacing in the hall this evening, disheveled, internally focused, sometimes whispering to himself.  Did come to med window with some encouragement and took his hs meds, poor eye contact, very soft spoken but compliant and polite.  Will continue to monitor.

## 2011-06-09 NOTE — Progress Notes (Signed)
Pt has been cooperative and pleasant  He has minimal interaction with others  He attends and participates in groups  Verbal support given  Medications administered and effectiveness monitored  Q 15 min checks  Pt safe at present

## 2011-06-10 DIAGNOSIS — F200 Paranoid schizophrenia: Secondary | ICD-10-CM

## 2011-06-10 DIAGNOSIS — F1220 Cannabis dependence, uncomplicated: Secondary | ICD-10-CM | POA: Diagnosis present

## 2011-06-10 MED ORDER — DIVALPROEX SODIUM ER 500 MG PO TB24
500.0000 mg | ORAL_TABLET | ORAL | Status: DC
  Administered 2011-06-10 – 2011-06-17 (×14): 500 mg via ORAL
  Filled 2011-06-10 (×3): qty 1
  Filled 2011-06-10: qty 28
  Filled 2011-06-10 (×2): qty 1
  Filled 2011-06-10: qty 28
  Filled 2011-06-10 (×10): qty 1

## 2011-06-10 MED ORDER — QUETIAPINE FUMARATE 400 MG PO TABS
400.0000 mg | ORAL_TABLET | Freq: Every day | ORAL | Status: DC
  Administered 2011-06-10 – 2011-06-12 (×3): 400 mg via ORAL
  Filled 2011-06-10 (×5): qty 1

## 2011-06-10 MED ORDER — SERTRALINE HCL 50 MG PO TABS
50.0000 mg | ORAL_TABLET | Freq: Every day | ORAL | Status: DC
  Administered 2011-06-10 – 2011-06-14 (×5): 50 mg via ORAL
  Filled 2011-06-10 (×7): qty 1

## 2011-06-10 NOTE — Progress Notes (Signed)
Patient ID: Isaiah Peterson, male   DOB: 17-Apr-1975, 36 y.o.   MRN: [id number] Pt reports fair sleep and improving appetite.  His energy level is low. He reports his ability to pay attention is poor.   He rates his depression and his hopelessness a 10. Pt says he has had thoughts of hurting himself off and on for the last 24 hours. He does say he plans to take his meds to take better care of himself.

## 2011-06-10 NOTE — Discharge Planning (Signed)
Requested 3 additional days for patient.  Met with him individually.  States he will return home at d/c with long-term girlfriend.  Plans to follow up at [facility] even though he has had bad experience there.  I'm concerned about his Seroquel.  He has no income and I'm not sure he will be able to afford it.  May want to consider trying him on Haldol or another anti-psychotic on $4.00 [facility] formulary.

## 2011-06-10 NOTE — Progress Notes (Signed)
06/10/2011  Time: 0930   Group Topic/Focus: The focus of this group is on discussing various styles of communication and communicating assertively using 'I' (feeling) statements.   Participation Level:  Active  Participation Quality:  Redirectable  Affect:  Irritable  Cognitive:  Oriented  Additional Comments: Patient snapped at RT several times, but later apologized. Patient says he finds himself getting irritable without provocation/cause and it makes him feel even angrier.  Isaiah Peterson  06/10/2011 1:16 PM

## 2011-06-10 NOTE — Progress Notes (Signed)
[facility] Group Notes:  (Counselor/Nursing/MHT/Case Management/Adjunct)  06/10/2011 2:20 PM  Type of Therapy:  Group Therapy  Participation Level:  Minimal  Participation Quality:  Attentive  Affect:  Anxious and Depressed  Cognitive:  Oriented  Insight:  Limited  Engagement in Group:  Limited  Engagement in Therapy:  Limited  Modes of Intervention:  Support  Summary of Progress/Problems: Patient was quiet but attentive. Appeared anxious as evidenced by body posture.   Berenize Gatlin, Aram Beecham 06/10/2011, 2:20 PM
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
## 2011-06-10 NOTE — Progress Notes (Signed)
Patient ID: Isaiah Peterson, male   DOB: 1975-08-22, 36 y.o.   MRN: [id number] Has been out on hall, walking frequently and seems to be internally focused, smiles frequently to himself.  Asked about hearing voices, stated," they come and go".  Has been polite and soft -spoken, taking meds without issue. Denies SI/HI at this time.  Quiet, poor eye contact, doesn't initiate conversation.  Interactions are minimal. Will continue to monitor.

## 2011-06-11 LAB — HEPATIC FUNCTION PANEL
AST: 16 U/L (ref 0–37)
Albumin: 4.5 g/dL (ref 3.5–5.2)
Total Bilirubin: 0.2 mg/dL — ABNORMAL LOW (ref 0.3–1.2)
Total Protein: 7.6 g/dL (ref 6.0–8.3)
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
## 2011-06-11 NOTE — Progress Notes (Signed)
[facility] Group Notes:  (Counselor/Nursing/MHT/Case Management/Adjunct)  06/11/2011 3:56 PM  Type of Therapy:  Group Therapy  Participation Level:  Minimal  Participation Quality:  Attentive  Affect:  Depressed  Cognitive:  Oriented  Insight:  Limited  Engagement in Group:  Limited  Engagement in Therapy:  Limited  Modes of Intervention:  Education and Problem-solving  Summary of Progress/Problems: Patient was quiet but attentive.   HartisAram Beecham 06/11/2011, 3:56 PM

## 2011-06-11 NOTE — Tx Team (Signed)
Initial Interdisciplinary Treatment Plan  PATIENT STRENGTHS: (choose at least two) Communication skills Physical Health Supportive family/friends  PATIENT STRESSORS: Educational concerns Marital or family conflict Substance abuse   PROBLEM LIST: Problem List/Patient Goals Date to be addressed Date deferred Reason deferred Estimated date of resolution  Homicidal ideations 06-11-11     THC abuse 06-11-11                                                DISCHARGE CRITERIA:  Adequate post-discharge living arrangements Improved stabilization in mood, thinking, and/or behavior Need for constant or close observation no longer present  PRELIMINARY DISCHARGE PLAN: Attend aftercare/continuing care group Participate in family therapy Placement in alternative living arrangements  PATIENT/FAMIILY INVOLVEMENT: This treatment plan has been presented to and reviewed with the patient, Isaiah Peterson, and/or family member.  The patient and family have been given the opportunity to ask questions and make suggestions.  Mickeal Needy 06/11/2011, 5:05 AM

## 2011-06-11 NOTE — Progress Notes (Signed)
Patient ID: AUTREY HUMAN, male   DOB: 01/05/1976, 35 y.o.   MRN: [id number] (D) Pt. Lying in bed, resting.  Awake, alert.  NAD.  Dressed appropriately.  Hair is disheveled.  Went to dinner and stayed up afterwards.  Noted to be pacing the halls.  (A) Discussed nursing care plan.  (R) Pt. Denies SI/HI.  Verbalized auditory hallucinations, but quieter than they were before. He states the voices are those of deceased loved ones but the chatter is indistinct.  Denies visual hallucinations.  Contracts for safety.
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
## 2011-06-12 LAB — HEMOGLOBIN A1C
Hgb A1c MFr Bld: 5.4 % (ref <5.7)
Mean Plasma Glucose: 108 mg/dL (ref <117)

## 2011-06-12 LAB — LIPID PANEL
Cholesterol: 171 mg/dL (ref 0–200)
HDL: 29 mg/dL — ABNORMAL LOW (ref >39)
Total CHOL/HDL Ratio: 5.9 RATIO

## 2011-06-12 LAB — TSH: TSH: 1.968 u[IU]/mL (ref 0.350–4.500)

## 2011-06-12 LAB — T4, FREE: Free T4: 1.08 ng/dL (ref 0.80–1.80)

## 2011-06-12 LAB — VALPROIC ACID LEVEL: Valproic Acid Lvl: 89.4 ug/mL (ref 50.0–100.0)
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
## 2011-06-12 NOTE — Progress Notes (Signed)
06/12/2011  Time: 0930   Group Topic/Focus: The focus of this group is on discussing various aspects of wellness, balancing those aspects and exploring ways to increase the ability to experience wellness.   Participation Level:  Active  Participation Quality:  Attentive  Affect:  Appropriate  Cognitive:  Alert   Additional Comments: Patient reports improved mood, states he feels less agitated today.   Isaiah Peterson  06/12/2011 1:07 PM

## 2011-06-12 NOTE — Progress Notes (Signed)
Patient reports passive suicidal thoughts, he reports HI toward fiance's family.  He has hallucinations of dead relatives.  Offered support, 15 minute check monitoring. Patient contracts with staff for safety.
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
## 2011-06-12 NOTE — Progress Notes (Signed)
Pt resting in bed with eyes closed.  No distress observed.  Safety maintained with q15 minute checks. 

## 2011-06-12 NOTE — Progress Notes (Signed)
Patient ID: Isaiah Peterson, male   DOB: May 25, 1975, 36 y.o.   MRN: [id number] Pt is awake in bed this PM. Pt declines to attend recreational therapy in the gymnasium. Pt states that he has anxiety around large groups of people, but that his medication is helping a little. He thinks people are talking about him and plotting against him. Writer asked pt if he believes that they are truly talking about him and he admits that it might only be in his mind. Pt endorses passive SI but is able to contract for safety. Pt states that he hears voices which tell him to hurt himself but he tries to ignore them in order to put them out of his mind. Pt also states that he sees dead relatives at times when he is sitting in the day room, but when he turns to look at them they disappear. He also sees them when in bed but cant tell if he is dreaming or not when he sees them in his room. Pt is pleasant with staff. Writer will continue to monitor.

## 2011-06-12 NOTE — Progress Notes (Signed)
[facility] Group Notes:  (Counselor/Nursing/MHT/Case Management/Adjunct)  06/12/2011 1:45 PM  Type of Therapy:  Group Therapy  Participation Level:  Active  Participation Quality:  Attentive, Sharing and Supportive  Affect:  Blunted  Cognitive:  Oriented  Insight:  Limited  Engagement in Group:  Good  Engagement in Therapy:  Good  Modes of Intervention:  Education, Problem-solving, Support and Exploration  Summary of Progress/Problems: Patient talked about going from family member to family member all his life. He talked about strained relationships and the effects of his childhood. He stated that he has anger problems and doesn't trust others. He did reestablish a relationship with father as an adult but then had to cut ties because it wasn't healthy. Receptive to changing patterns.    Wendelin Bradt, Aram Beecham 06/12/2011, 1:45 PM

## 2011-06-13 MED ORDER — QUETIAPINE FUMARATE 200 MG PO TABS
500.0000 mg | ORAL_TABLET | Freq: Every day | ORAL | Status: DC
  Administered 2011-06-13 – 2011-06-16 (×4): 500 mg via ORAL
  Filled 2011-06-13 (×4): qty 2.5
  Filled 2011-06-13: qty 35
  Filled 2011-06-13: qty 2.5

## 2011-06-13 NOTE — Progress Notes (Signed)
Pt reported to writer that he has had passive si today and no hi. He also reports hallucinations. On self inventory pt responds as having no si in the past 24 hrs. He rates depression as a 5 with low energy. Pt contracts with Clinical research associate for safety.

## 2011-06-13 NOTE — Progress Notes (Signed)
Pt resting in bed with eyes closed.  No distress observed.  Safety maintained with q15 minute checks. 

## 2011-06-13 NOTE — Progress Notes (Signed)
[facility] MD Progress Note  06/13/2011 2:30 PM  Diagnosis:  Axis I: Schizophrenia - Paranoid Type.  Cannabis Abuse.   The patient was seen today and reports the following:   ADL's: Intact.  Sleep: The patient reports to having ongoing difficulty maintaining sleep.  Appetite: The patient reports a decreased appetite today.   Mild>(1-10) >Severe  Hopelessness (1-10): 1-2  Depression (1-10): 0  Anxiety (1-10): 9   Suicidal Ideation: The patient denies any suicidal ideations today.  Plan: No.  Intent: No.  Means: No.   Homicidal Ideation: The patient adamantly denies any homicidal ideations.  Plan: No  Intent: No.  Means: No   General Appearance /Behavior: Remains casual and cooperative today.  Eye Contact: Good.  Speech: Appropriate in rate and volume with no pressuring noted.  Motor Behavior: Appropriate.  Level of Consciousness: Alert and Oriented x 3.  Mental Status: Alert and Oriented x 3.  Mood: Essentially euthymic today.  Affect: Mildly constricted.  Anxiety Level: Severe anxiety reported today.  Thought Process: Auditory hallucinations reported but improving.  Thought Content: The patient reports auditory hallucinations continuing today but with improvement in the severity.  The patient reports some paranoid thoughts but with no visual hallucinations. Perception:. Auditory hallucinations reported.  Judgment: Fair.  Insight: Fair.  Cognition: Oriented to time, place and person.  Sleep:  Number of Hours: 6    Vital Signs:Blood pressure 121/70, pulse 86, temperature 96.6 F (35.9 C), temperature source Oral, resp. rate 18, height 5\' 11"  (1.803 m), weight 59.421 kg (131 lb).  Current Medications: Current Facility-Administered Medications  Medication Dose Route Frequency Provider Last Rate Last Dose  . acetaminophen (TYLENOL) tablet 650 mg  650 mg Oral Q6H PRN Syed T. Arfeen, MD      . alum & mag hydroxide-simeth (MAALOX/MYLANTA) 200-200-20 MG/5ML suspension 30 mL  30 mL  Oral Q4H PRN Syed T. Arfeen, MD      . divalproex (DEPAKOTE ER) 24 hr tablet 500 mg  500 mg Oral BH-qamhs Eria Lozoya, MD   500 mg at 06/13/11 0824  . magnesium hydroxide (MILK OF MAGNESIA) suspension 30 mL  30 mL Oral Daily PRN Syed T. Arfeen, MD      . QUEtiapine (SEROQUEL) tablet 500 mg  500 mg Oral QHS Katarzyna Wolven, MD      . sertraline (ZOLOFT) tablet 50 mg  50 mg Oral Daily Franchot Gallo, MD   50 mg at 06/13/11 0824  . DISCONTD: QUEtiapine (SEROQUEL) tablet 400 mg  400 mg Oral QHS Franchot Gallo, MD   400 mg at 06/12/11 2156   Lab Results:  Results for orders placed during the hospital encounter of 06/08/11 (from the past 48 hour(s))  VALPROIC ACID LEVEL     Status: Normal   Collection Time   06/11/11  7:47 PM      Component Value Range Comment   Valproic Acid Lvl 89.4  50.0 - 100.0 (ug/mL)   HEMOGLOBIN A1C     Status: Normal   Collection Time   06/11/11  7:47 PM      Component Value Range Comment   Hemoglobin A1C 5.4  <5.7 (%)    Mean Plasma Glucose 108  <117 (mg/dL)   LIPID PANEL     Status: Abnormal   Collection Time   06/11/11  7:47 PM      Component Value Range Comment   Cholesterol 171  0 - 200 (mg/dL)    Triglycerides 161 (*) <150 (mg/dL)    HDL 29 (*) >  39 (mg/dL)    Total CHOL/HDL Ratio 5.9      VLDL 79 (*) 0 - 40 (mg/dL)    LDL Cholesterol 63  0 - 99 (mg/dL)   TSH     Status: Normal   Collection Time   06/11/11  7:47 PM      Component Value Range Comment   TSH 1.968  0.350 - 4.500 (uIU/mL)   T4, FREE     Status: Normal   Collection Time   06/11/11  7:47 PM      Component Value Range Comment   Free T4 1.08  0.80 - 1.80 (ng/dL)   T3, FREE     Status: Normal   Collection Time   06/11/11  7:47 PM      Component Value Range Comment   T3, Free 2.5  2.3 - 4.2 (pg/mL)   HEPATIC FUNCTION PANEL     Status: Abnormal   Collection Time   06/11/11  7:47 PM      Component Value Range Comment   Total Protein 7.6  6.0 - 8.3 (g/dL)    Albumin 4.5  3.5 - 5.2 (g/dL)    AST 16  0  - 37 (U/L)    ALT 9  0 - 53 (U/L)    Alkaline Phosphatase 56  39 - 117 (U/L)    Total Bilirubin 0.2 (*) 0.3 - 1.2 (mg/dL)    Bilirubin, Direct <1.6  0.0 - 0.3 (mg/dL)    Indirect Bilirubin NOT CALCULATED  0.3 - 0.9 (mg/dL)    Time was spent today discussing with the patient his current symptoms. The patient reports that the severity of his auditory hallucinations have diminished but with mild auditory hallucinations continuing.  He reports some paranoid delusions when in crowds of people.  He reports to seeing deceased relatives only when he first awakens in the middle of the night.  He states that when he sees the relative, they disappear after a couple of seconds.  Treatment Plan Summary:  1. Daily contact with patient to assess and evaluate symptoms and progress in treatment  2. Medication management  3. The patient will deny suicidal ideations or homicidal ideations for 48 hours prior to discharge and have a depression and anxiety rating of 3 or less. The patient will also deny any auditory or visual hallucinations or delusional thinking.  4. The patient will deny any symptoms of substance withdrawal at time of discharge.   Plan:  1. Will continue current medications. 2. Will increase the medication Seroquel to 500 mgs po qhs to further address his sleep difficulty and psychosis.  3. Will continue to monitor.  Isabella Ida 06/13/2011, 2:30 PM

## 2011-06-13 NOTE — Progress Notes (Signed)
Pt. Denies SI/HI. Reports hearing voices but the voices are slowly going away.  Pt. Focused on his girl friend and her pregnancy.  No other issues or concerns voiced.  Encouragement and support given.

## 2011-06-13 NOTE — Progress Notes (Signed)
[facility] Group Notes:  (Counselor/Nursing/MHT/Case Management/Adjunct)  06/13/2011 1:37 PM  Type of Therapy:  Group Therapy  Participation Level:  Active  Participation Quality:  Attentive and Sharing  Affect:  Blunted  Cognitive:  Oriented  Insight:  Limited  Engagement in Group:  Good  Engagement in Therapy:  Good  Modes of Intervention:  Education, Problem-solving, Support and Exploration  Summary of Progress/Problems: Patient continued to talk about trust issues because of his past. He also stated that father was an alcoholic and couldn't be a resource. Even his girlfriend who he is closest to is a Charity fundraiser and he says that she doesn't always understand him. Will return to mental health but was receptive to maybe having some individual counseling. Understands that things at home with his girlfriend's uncle probably won't change but he will just have to do some acttivites to help him get away including taking walks, nature-taking time to enjoy, and listening to music.   Garlon Tuggle, Aram Beecham 06/13/2011, 1:37 PM

## 2011-06-13 NOTE — Discharge Planning (Signed)
Met with patient in Aftercare Planning Group. Group had a lengthy discussion about asking for and receiving help, the difficulty of doing that sometimes, and the differences chemically in various bodies that leads to different medication solutions for similar issues. Patient participated fully in this discussion.  He stated he was feeling better today, even though the news was bad from [employer] which he received yesterday, because at least he knows where he stands.  Patient does appear to have some limited intellectual functioning and does not completely understand that he has to start the disability application over again unless the initial interviewer is willing to reopen the case which was just closed.  Also discussed with patient that if his first doctor's appointment is more than 30 days after his intake, he is to call CM to see if somehow that could be moved up.  No case management needs today.  Isaiah Mantle, LCSW 06/13/2011, 12:44 PM

## 2011-06-14 MED ORDER — SERTRALINE HCL 100 MG PO TABS
100.0000 mg | ORAL_TABLET | Freq: Every day | ORAL | Status: DC
  Administered 2011-06-15 – 2011-06-17 (×3): 100 mg via ORAL
  Filled 2011-06-14 (×2): qty 1
  Filled 2011-06-14: qty 14
  Filled 2011-06-14 (×3): qty 1

## 2011-06-14 NOTE — Progress Notes (Signed)
[facility] MD Progress Note  06/14/2011 4:02 PM  Diagnosis:  Axis I: Schizophrenia - Paranoid Type.  Cannabis Abuse.   The patient was seen today and reports the following:   ADL's: Intact.  Sleep: The patient reports to sleeping well last night.  Appetite: The patient reports a good appetite today.   Mild>(1-10) >Severe  Hopelessness (1-10): 5  Depression (1-10): 4  Anxiety (1-10): 4   Suicidal Ideation: The patient adamantly denies any suicidal ideations.  Plan: No.  Intent: No.  Means: No.   Homicidal Ideation: The patient adamantly denies any homicidal ideations.  Plan: No  Intent: No.  Means: No   General Appearance /Behavior: Remains casual and cooperative today.  Eye Contact: Good.  Speech: Appropriate in rate and volume with no pressuring noted.  Motor Behavior: Appropriate.  Level of Consciousness: Alert and Oriented x 3.  Mental Status: Alert and Oriented x 3.  Mood: Mild to Moderately Depressed.  Affect: Mild to Moderately constricted.  Anxiety Level: Moderate anxiety reported today.  Thought Process: wnl.  Thought Content: The patient denies any auditory or visual hallucinations in the last 12 hours since increasing the medication Seroquel last night.  He also denies any delusional thinking today.  Perception:. wnl.  Judgment: Fair to Good.  Insight: Fair to Good.  Cognition: Oriented to time, place and person.  Sleep:  Number of Hours: 6.5    Vital Signs:Blood pressure 126/85, pulse 96, temperature 96.3 F (35.7 C), temperature source Oral, resp. rate 18, height 5\' 11"  (1.803 m), weight 59.421 kg (131 lb).  Current Medications: Current Facility-Administered Medications  Medication Dose Route Frequency Provider Last Rate Last Dose  . acetaminophen (TYLENOL) tablet 650 mg  650 mg Oral Q6H PRN Cleotis Nipper, MD      . alum & mag hydroxide-simeth (MAALOX/MYLANTA) 200-200-20 MG/5ML suspension 30 mL  30 mL Oral Q4H PRN Cleotis Nipper, MD      . divalproex (DEPAKOTE  ER) 24 hr tablet 500 mg  500 mg Oral BH-qamhs Curlene Labrum Faithlynn Deeley, MD   500 mg at 06/14/11 0831  . magnesium hydroxide (MILK OF MAGNESIA) suspension 30 mL  30 mL Oral Daily PRN Cleotis Nipper, MD      . QUEtiapine (SEROQUEL) tablet 500 mg  500 mg Oral QHS Curlene Labrum Emmaleigh Longo, MD   500 mg at 06/13/11 2159  . sertraline (ZOLOFT) tablet 100 mg  100 mg Oral Daily Ronny Bacon, MD      . DISCONTD: sertraline (ZOLOFT) tablet 50 mg  50 mg Oral Daily Curlene Labrum Dean Wonder, MD   50 mg at 06/14/11 0831   Lab Results: No results found for this or any previous visit (from the past 48 hour(s)).  Time was spent today discussing with the patient his current symptoms. The patient reports that he has experienced no auditory or visual hallucinations since increasing the medication Seroquel.  He does report mild to moderate depression and anxiety which is slowly improving.  He continues to report concerns about losing his disability and hopes that it will be reinstated without having to restart the process.  Treatment Plan Summary:  1. Daily contact with patient to assess and evaluate symptoms and progress in treatment  2. Medication management  3. The patient will deny suicidal ideations or homicidal ideations for 48 hours prior to discharge and have a depression and anxiety rating of 3 or less. The patient will also deny any auditory or visual hallucinations or delusional thinking.  4. The  patient will deny any symptoms of substance withdrawal at time of discharge.   Plan:  1. Will continue current medications.  2. Will increase the medication Zoloft to 100 mgs po q am to further address his depressive symptoms. 3. Will continue to monitor.  Mckynlie Vanderslice 06/14/2011, 4:02 PM

## 2011-06-14 NOTE — Progress Notes (Signed)
Pt is in bed resting with his eyes closed  No distress noted   Q 15 min checks  Pt safe at present

## 2011-06-14 NOTE — Tx Team (Signed)
Interdisciplinary Treatment Plan Update (Adult)  Date:  06/14/2011  Time Reviewed:  10:15AM-11:00AM  Progress in Treatment: Attending groups:  Yes Participating in groups:    Listens Taking medication as prescribed:    Yes Tolerating medication:   Yes Family/Significant other contact made:  Yes Patient understands diagnosis:   Yes, limited insight Discussing patient identified problems/goals with staff:   Yes Medical problems stabilized or resolved:   Yes Denies suicidal/homicidal ideation:  Yes Issues/concerns per patient self-inventory:   None Other:    New problem(s) identified: No, Describe:    Reason for Continuation of Hospitalization: Anxiety Depression Hallucinations Medication stabilization  Interventions implemented related to continuation of hospitalization:  Medication monitoring and adjustment, safety checks Q15 min., suicide risk assessment, group therapy, psychoeducation, collateral contact, aftercare planning, ongoing physician assessments, medication education  Additional comments:  Not applicable  Estimated length of stay:  3 days  Discharge Plan:  Will live at home with girlfriend, cousin to pick up, will follow up with [facility].  New goal(s):  Not applicable  Review of initial/current patient goals per problem list:   1.  Goal(s):  Deny SI and HI for 48 hours prior to D/C.  Met:  Yes  Target date:  By Discharge   As evidenced by:  Has been right at 48 hours  2.  Goal(s):  Decide how to address substance abuse issues.  Met:  Yes  Target date:  By Discharge   As evidenced by:  Does not wish specific treatment for this.  3.  Goal(s):  Reduce psychotic symptoms to baseline.  Met:  No  Target date:  By Discharge   As evidenced by:  Last auditory hallucinations were last night, need longer  4.  Goal(s):  Reduce depression and anxiety from 10 at admission to no more than 3 at discharge.  Met:  No  Target date:  By Discharge   As evidenced  by:  All at "4-5" still, anti-depressant dosage being increased    Attendees: Patient:  Isaiah Peterson  06/14/2011 10:15AM-11:00AM  Family:     Physician:  Dr. Harvie Heck Readling 06/14/2011 10:15AM-11:00AM  Nursing:   Edwyna Shell, RN 06/14/2011 10:15AM -11:00AM   Case Manager:  Ambrose Mantle, LCSW 06/14/2011 10:15AM-11:00AM  Counselor:  Veto Kemps, MT-BC 06/14/2011 10:15AM-11:00AM  Other:   Lynann Bologna, NP 06/14/2011 10:15AM-11:00AM  Other:      Other:      Other:       Scribe for Treatment Team:   Sarina Ser, 06/14/2011, 10:15AM-11:15AM

## 2011-06-14 NOTE — Discharge Planning (Addendum)
Met with patient in Aftercare Planning Group.   Explained once again about purpose of first phone call to [facility] on 3/21, that it does not necessarily mean they will reopen his current disability, but they may actually decide to start a new application.  Patient's understanding is limited.  Did referral to [facility] for follow-up appointment, recommending medication management and Targeted Case Management.  Also did utilization review for additional days, and confirmed that patient's insurance is current so he will be able with #4 co-pays to get his medications.  Ambrose Mantle, LCSW 06/14/2011, 4:25 PM

## 2011-06-14 NOTE — Progress Notes (Signed)
Pt reports no hallucinations in past 24 hrs and no si or hi. Pt reports depression as a 4 and hopelessness of 5. Monitored q15 minutes and gave encouragement. Pt remains safe on unit.

## 2011-06-14 NOTE — Progress Notes (Signed)
[facility] Group Notes:  (Counselor/Nursing/MHT/Case Management/Adjunct)  06/14/2011 1:59 PM  Type of Therapy:  Group Therapy  Participation Level:  Minimal  Participation Quality:  Attentive and Sharing  Affect:  Depressed  Cognitive:  Oriented  Insight:  Limited  Engagement in Group:  Limited  Engagement in Therapy:  Limited  Modes of Intervention:  Education, Problem-solving and Support  Summary of Progress/Problems: Patient was generally quiet but when called on he answered questions. He stated that he plans to try to remove himself from situations when they are negative, specifically with his girlfriend's uncle.   HartisAram Beecham 06/14/2011, 1:59 PM

## 2011-06-14 NOTE — Progress Notes (Signed)
Pt has been up and visible in milieu today, has been active in various activities, pt has denied any suicidal thoughts, has stated that he feels ok today, did endorse some anxiety, pt has received medications without incident, will continue to monitor

## 2011-06-15 NOTE — Progress Notes (Signed)
Patient ID: Isaiah Peterson, male   DOB: 20-Jul-1975, 36 y.o.   MRN: [id number]  [facility] Group Notes:  (Counselor/Nursing/MHT/Case Management/Adjunct)  06/15/2011 11 AM  Type of Therapy:  Group Therapy, Dance/Movement Therapy   Participation Level:  Minimal  Participation Quality:  Drowsy  Affect:  Appropriate  Cognitive:  Confused  Insight:  Limited  Engagement in Group:  Limited  Engagement in Therapy:  Limited  Modes of Intervention:  Clarification, Problem-solving, Role-play, Socialization and Support  Summary of Progress/Problems:  Therapist discussed with group the meaning of feeling supported and what healthy support means to them. Pt. described healthy support as " Eating a healthy meal".    Rhunette Croft

## 2011-06-15 NOTE — Progress Notes (Signed)
Patient ID: Isaiah Peterson, male   DOB: 09-Apr-1975, 36 y.o.   MRN: [id number] Pt. attended and participated in aftercare planning group. Pt. accepted information on suicide prevention, warning signs to look for with suicide and crisis line numbers to use. The pt. agreed to call crisis line numbers if having warning signs or having thoughts of suicide. Pt. listed their current anxiety level as good.  Pt. accepted mental health awareness information.

## 2011-06-15 NOTE — Progress Notes (Signed)
Pt is pleasant and appropriate  He attends and participates in most groups  He reports feeling ready for discharge and is looking forward to possible discharge on Monday  Pt still looks sad and depressed  He reports the voices are managable and less noticeable   Verbal support given  Medication administered and effectiveness monitored   Q 15 min checks  Pt safe at present

## 2011-06-15 NOTE — Progress Notes (Signed)
Pt. Is in bed with his eyes closed appearing to rest quietly.

## 2011-06-15 NOTE — Progress Notes (Signed)
Patient ID: Isaiah Peterson, male   DOB: [birth year], 36 y.o.   MRN: [id number] Isaiah Peterson is fully alert, calm, polite, and cooperative today. He says that he has spoken to his wife and extended his apologies to her own goal, for threatening him with a flare gun, which he doesn't remember. He the sheriff has, and taken a flare gun away, and at he says he's not going to get it back. His wife has taken all of his knives and placed in a locked box.   He rates his depression is improved today only 2-3/10 on a 1-10 scale, and rates worry and anxiety as 6-7/10. He is primarily worried about going to court on March 21 regarding social service custody of the six-year-old son.  He reports he has not heard any voices in 3 days now. Sleep and appetite are fair. Reports he ate 2 helpings for lunch today. He is hoping to be discharged on Monday.  Plan: We'll discharge Monday if stable. He is very pleased with how he feels.

## 2011-06-16 NOTE — Progress Notes (Signed)
Patient ID: Isaiah Peterson, male   DOB: 1975-09-24, 36 y.o.   MRN: [id number]  Isaiah Peterson reports that he is ready for discharge. His wife says that she can get him a ride to come back home. His group participation has been good, he reports he's been sleeping very well. He has had no auditory hallucinations in more than 3 days. Rate his anxiety is 0/10. Rates his depression a 0/10.  He is fully alert today and in full contact with reality. Speech is non-pressured, affect is bright, thinking is goal oriented and appropriate, with no dangerous ideas. Insight good.  Plan:  Will review with team for discharge in the am.

## 2011-06-16 NOTE — Progress Notes (Signed)
Patient ID: Isaiah Peterson, male   DOB: 02-23-1976, 36 y.o.   MRN: [id number] Has been out on the hall, some pacing, but has been pleasant and cooperative.  Still seems internally focused, but tonight denies voices, denies SI/HI.  Taking meds without issue, attended group.  Will continue to monitor.

## 2011-06-16 NOTE — Progress Notes (Signed)
Pt is cooperative and pleasant on approach   He reports the voices are not bothering him anymore  He said he slept real well last night  He appears sad and he said his mood was still depressed   Pt attends and participates in groups and interacts appropriately with others  Verbal support given  Medications administered and effectiveness monitored  Q 15 min checks  Pt safe at present

## 2011-06-16 NOTE — Progress Notes (Signed)
Patient ID: Isaiah Peterson, male   DOB: 1975-06-04, 36 y.o.   MRN: [id number]  Pt. attended and participated in aftercare planning group. Pt shared that he was feeling awake. Pt. listed their current anxiety level as 1.5 and depression as a 0. Pt. accepted information on suicide prevention, warning signs to look for with suicide and crisis line numbers to use. The pt. agreed to call crisis line numbers if having warning signs or having thoughts of suicide. Pt denied S/I and H/I. Pt stated that he has an appointment for his social security on the March 21, and that he will follow-up with [facility].

## 2011-06-16 NOTE — Progress Notes (Signed)
Patient ID: Isaiah Peterson, male   DOB: November 06, 1975, 36 y.o.   MRN: [id number]   [facility] Group Notes:  (Counselor/Nursing/MHT/Case Management/Adjunct)  06/16/2011 11 AM  Type of Therapy:  Group Therapy, Dance/Movement Therapy   Participation Level:  Active  Participation Quality:  Appropriate  Affect:  Appropriate  Cognitive:  Oriented  Insight:  Limited  Engagement in Group:  Good  Engagement in Therapy:  Good  Modes of Intervention:  Clarification, Problem-solving, Role-play, Socialization and Support  Summary of Progress/Problems:  Group practiced healthy interpersonal and intrapersonal communication through movement. Pt shared that he felt relaxed. Pt displayed a greater sense of calm when closing his eyes, but then when making appropriate eye contact with peers, he kept his arms and hands close into torso or in his pockets, which indicates a lesser sense of confidence and trust with others. His movements primarily occurred with his feet, which suggests that the pt is ready to shift into the action phase of his recovery.   Thomasena Edis, [employer]

## 2011-06-17 MED ORDER — DIVALPROEX SODIUM ER 500 MG PO TB24: 500.0000 mg | ORAL_TABLET | ORAL | Status: DC

## 2011-06-17 MED ORDER — QUETIAPINE FUMARATE 100 MG PO TABS: 100.0000 mg | ORAL_TABLET | Freq: Every day | ORAL | Status: DC

## 2011-06-17 MED ORDER — QUETIAPINE FUMARATE 400 MG PO TABS: ORAL_TABLET | ORAL | Status: DC

## 2011-06-17 MED ORDER — SERTRALINE HCL 100 MG PO TABS: 100.0000 mg | ORAL_TABLET | Freq: Every day | ORAL | Status: DC

## 2011-06-17 NOTE — Tx Team (Signed)
Interdisciplinary Treatment Plan Update (Adult)  Date:  06/17/2011  Time Reviewed:  10:15AM-11:00AM  Progress in Treatment: Attending groups:  Yes Participating in groups:    Yes, very softspoken Taking medication as prescribed:    Yes, no refusals Tolerating medication:   Yes, no side effects have been noted by staff or reported by patient Family/Significant other contact made:  Yes, with girlfriend Patient understands diagnosis:   Yes, limited insight, limited judgment Discussing patient identified problems/goals with staff:  Yes, fully engaged  Medical problems stabilized or resolved:   Yes Denies suicidal/homicidal ideation:  Yes Issues/concerns per patient self-inventory:   None Other:    New problem(s) identified: No, Describe:    Reason for Continuation of Hospitalization: None  Interventions implemented related to continuation of hospitalization:  Medication monitoring and adjustment, safety checks Q15 min., suicide risk assessment, group therapy, psychoeducation, collateral contact, aftercare planning, ongoing physician assessments, medication education - until discharge  Additional comments:  Not applicable  Estimated length of stay:  Discharge today  Discharge Plan:  Return to his home with girlfriend, follow up with [facility] for med mgmt and Targeted Case Management  New goal(s):  Not applicable  Review of initial/current patient goals per problem list:   1.  Goal(s):  Previously met  2.  Goal(s):  Previously met  3.  Goal(s):  Reduce psychotic symptoms to baseline.  Met:  Yes  Target date:  By Discharge   As evidenced by:  Has not heard voices in 2-3 days  4.  Goal(s):  Reduce depression and anxiety from 10 at admission to no more than 3 at discharge.  Met:  Yes  Target date:  By Discharge   As evidenced by:  Depression "0" today and anxiety "2-3" today  Attendees: Patient:  Isaiah Peterson  06/17/2011 10:15AM-11:00AM  Family:     Physician:  Dr.  Harvie Heck Readling 06/17/2011 10:15AM-11:00AM  Nursing:   Tacy Learn, RN 06/17/2011 10:15AM -11:00AM   Case Manager:  Ambrose Mantle, LCSW 06/17/2011 10:15AM-11:00AM  Counselor:  Veto Kemps, MT-BC 06/17/2011 10:15AM-11:00AM  Other:   Lynann Bologna, NP 06/17/2011 10:15AM-11:00AM  Other:   Roswell Miners, RN 06/17/2011 10:15AM-11:00AM  Other:      Other:       Scribe for Treatment Team:   Sarina Ser, 06/17/2011, 10:15AM-11:15AM
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
## 2011-06-17 NOTE — Progress Notes (Signed)
[facility] Case Management Discharge Plan:  Will you be returning to the same living situation after discharge: Yes,  with girlfriend and her uncle At discharge, do you have transportation home?:Yes,  girlfriend is working to find appointment Do you have the ability to pay for your medications:Yes,  has Chiropractor Information:     Release of information consent forms completed and in the chart;  Patient's signature needed at discharge.  Patient to Follow up at:  Follow-up Information    Follow up with [employer] on 06/27/2011. (1:20PM PHONE APPOINTMENT - You can call and change this to an appointment at their office if desired.)    Contact information:   [facility] - PHONE APPOINTMENT [address] [place]  [address] Telephone:  [phone]      Follow up with [facility] on 06/19/2011. (8AM appointment - Reference [id number])    Contact information:   [address] [place]  Telephone:  [phone]      Call Case Manager Glendale Chard.. (As needed if cannot get doctor appointment at [facility]  within 30 days)    Contact information:   [phone]         Patient denies SI/HI:   Yes,      Safety Planning and Suicide Prevention discussed:  Yes,  During Aftercare Planning Group on multiple occasions throughout patient's stay, Case Manager provided psychoeducation on "Suicide Prevention Information."  This included descriptions of risk factors for suicide, warning signs that an individual is in crisis and thinking of suicide, and what to do if this occurs.  Pt indicated understanding of information provided, and will read brochure given upon discharge.     Barrier to discharge identified:No.  Summary and Recommendations:  [facility] for medication management and Targeted Case Management; follow up with reapplying for disability.   Sarina Ser 06/17/2011, 3:48 PM

## 2011-06-17 NOTE — Progress Notes (Signed)
06/17/2011  Time: 0930   Group Topic/Focus: The focus of this group is on enhancing patients' problem solving skills, which involves identifying the problem, brainstorming solutions and choosing and trying a solution.   Participation Level:  Active  Participation Quality:  Supportive  Affect:  Blunted  Cognitive:  Oriented   Additional Comments: Patient reports looking forward to discharge today, when asked if he can be safe, patient states "I don't know, I should be." Patient took an active role in the problem solving activity and was able to give good suggestions to his peers.  Aissa Lisowski  06/17/2011 2:00 PM

## 2011-06-17 NOTE — [facility] Suicide Risk Assessment (Signed)
Suicide Risk Assessment  Discharge Assessment     Demographic factors:    Current Mental Status:  AO x 3. Risk Reduction Factors:     CLINICAL FACTORS:   Alcohol/Substance Abuse/Dependencies Schizophrenia:   Paranoid or undifferentiated type More than one psychiatric diagnosis Previous Psychiatric Diagnoses and Treatments  COGNITIVE FEATURES THAT CONTRIBUTE TO RISK:  None Noted.  Diagnosis:  Axis I: Schizophrenia - Paranoid Type.  Cannabis Abuse.   The patient was seen today and reports the following:   ADL's: Intact.  Sleep: The patient reports to continuing to sleep well at night.  Appetite: The patient reports a good appetite today.   Mild>(1-10) >Severe  Hopelessness (1-10): 2-3 Depression (1-10): 0 Anxiety (1-10): 2-3   Suicidal Ideation: The patient adamantly denies any suicidal ideations.  Plan: No.  Intent: No.  Means: No.   Homicidal Ideation: The patient adamantly denies any homicidal ideations.  Plan: No  Intent: No.  Means: No   General Appearance /Behavior: Remains casual and cooperative today.  Eye Contact: Good.  Speech: Appropriate in rate and volume with no pressuring noted.  Motor Behavior: Appropriate.  Level of Consciousness: Alert and Oriented x 3.  Mental Status: Alert and Oriented x 3.  Mood: Essentially Euthymic Today.  Affect: Bright and Full.  Anxiety Level: Mild anxiety reported today.  Thought Process: wnl.  Thought Content: The patient denies any auditory or visual hallucinations in the last three days. He also denies any delusional thinking today.  Perception:. wnl.  Judgment: Fair to Good.  Insight: Fair to Good.  Cognition: Oriented to time, place and person.   Lab Results: No results found for this or any previous visit (from the past 48 hour(s)).   Time was spent today discussing with the patient his current symptoms. The patient denies any depressive symptoms today as well as any auditory or visual hallucinations or  delusional thinking.  The patient reports some mild hopelessness and anxiety related to his current financial situation but states that overall he is happy with this progress and would like discharge today.  Treatment Plan Summary:  1. Daily contact with patient to assess and evaluate symptoms and progress in treatment  2. Medication management  3. The patient will deny suicidal ideations or homicidal ideations for 48 hours prior to discharge and have a depression and anxiety rating of 3 or less. The patient will also deny any auditory or visual hallucinations or delusional thinking.  4. The patient will deny any symptoms of substance withdrawal at time of discharge.   Plan:  1. Will continue current medications.  2. Laboratory studies reviewed today. 3. Will continue to monitor. 4. Will discharge today to outpatient follow up.  SUICIDE RISK:   Minimal: No identifiable suicidal ideation.  Patients presenting with no risk factors but with morbid ruminations; may be classified as minimal risk based on the severity of the depressive symptoms  Isaiah Peterson 06/17/2011, 10:00 AM

## 2011-06-17 NOTE — Progress Notes (Signed)
Pt discharged home. Pt states understanding of d/c instructions, f/u appts, and prescriptions. PT denies SI/HI/AVH. All belongings returned to pt from [place].

## 2011-06-17 NOTE — Progress Notes (Addendum)
Patient ID: Isaiah Peterson, male   DOB: [birth year], 36 y.o.   MRN: [id number] Was walking on hall this evening, and spent some time in the dayroom, seems to be internally focused but denies voices. States has a lot on his mind, stressful issues, custody battle with DSS over 23 yo,  bills they haven't received because of a recent address change, he has to re-apply for disability d/t the move and his checks were stopped d/t not having a forwarding address, according to pt.  States is trying to think about what all he has to do when he leaves.  Has been cooperative and polite, voices no c/o's, denies SI/HI. Will continue to monitor.

## 2011-06-19 NOTE — Progress Notes (Signed)
Patient Discharge Instructions:  Psychiatric Admission Assessment Note Provided,  06/19/2011 Discharge Summary Note Provided,   06/19/2011 After Visit Summary (AVS) Provided,  06/19/2011 Face Sheet Provided, 06/19/2011 Faxed/Sent to the Next Level Care provider:  06/19/2011  Faxed to [facility] @ [phone]  Wandra Scot, 06/19/2011, 5:15 PM

## 2011-08-01 ENCOUNTER — Emergency Department (HOSPITAL_COMMUNITY)
Admission: EM | Admit: 2011-08-01 | Discharge: 2011-08-01 | Disposition: A | Discharge status: Home / Self Care | Payer: Medicare Other | Attending: Emergency Medicine | Admitting: Emergency Medicine

## 2011-08-01 ENCOUNTER — Emergency Department (HOSPITAL_COMMUNITY): Payer: Medicare Other

## 2011-08-01 ENCOUNTER — Encounter (HOSPITAL_COMMUNITY): Payer: Self-pay

## 2011-08-01 DIAGNOSIS — F319 Bipolar disorder, unspecified: Secondary | ICD-10-CM | POA: Insufficient documentation

## 2011-08-01 DIAGNOSIS — K8020 Calculus of gallbladder without cholecystitis without obstruction: Secondary | ICD-10-CM | POA: Insufficient documentation

## 2011-08-01 DIAGNOSIS — F17200 Nicotine dependence, unspecified, uncomplicated: Secondary | ICD-10-CM | POA: Insufficient documentation

## 2011-08-01 DIAGNOSIS — K859 Acute pancreatitis without necrosis or infection, unspecified: Secondary | ICD-10-CM

## 2011-08-01 DIAGNOSIS — R079 Chest pain, unspecified: Secondary | ICD-10-CM | POA: Insufficient documentation

## 2011-08-01 DIAGNOSIS — F259 Schizoaffective disorder, unspecified: Secondary | ICD-10-CM | POA: Insufficient documentation

## 2011-08-01 LAB — URINALYSIS, ROUTINE W REFLEX MICROSCOPIC
Bilirubin Urine: NEGATIVE
Glucose, UA: NEGATIVE mg/dL
Hgb urine dipstick: NEGATIVE
Ketones, ur: NEGATIVE mg/dL
Protein, ur: NEGATIVE mg/dL

## 2011-08-01 LAB — CBC
HCT: 40.4 % (ref 39.0–52.0)
MCH: 31.2 pg (ref 26.0–34.0)
MCV: 91.4 fL (ref 78.0–100.0)
RBC: 4.42 MIL/uL (ref 4.22–5.81)
WBC: 6.1 10*3/uL (ref 4.0–10.5)

## 2011-08-01 LAB — BASIC METABOLIC PANEL
BUN: 13 mg/dL (ref 6–23)
CO2: 26 mEq/L (ref 19–32)
Calcium: 9.3 mg/dL (ref 8.4–10.5)
Chloride: 104 mEq/L (ref 96–112)
Creatinine, Ser: 1.16 mg/dL (ref 0.50–1.35)
Glucose, Bld: 101 mg/dL — ABNORMAL HIGH (ref 70–99)

## 2011-08-01 LAB — HEPATIC FUNCTION PANEL
ALT: 11 U/L (ref 0–53)
AST: 16 U/L (ref 0–37)
Alkaline Phosphatase: 46 U/L (ref 39–117)
Bilirubin, Direct: <0.1 mg/dL (ref 0.0–0.3)
Total Bilirubin: 0.3 mg/dL (ref 0.3–1.2)

## 2011-08-01 MED ORDER — HYDROCODONE-ACETAMINOPHEN 5-325 MG PO TABS: 1.0000 | ORAL_TABLET | Freq: Four times a day (QID) | ORAL | Status: AC | PRN

## 2011-08-01 MED ORDER — ONDANSETRON HCL 4 MG/2ML IJ SOLN
4.0000 mg | Freq: Once | INTRAMUSCULAR | Status: AC
  Administered 2011-08-01: 4 mg via INTRAVENOUS
  Filled 2011-08-01: qty 2

## 2011-08-01 MED ORDER — PROMETHAZINE HCL 25 MG PO TABS: 25.0000 mg | ORAL_TABLET | Freq: Four times a day (QID) | ORAL | Status: DC | PRN

## 2011-08-01 MED ORDER — SODIUM CHLORIDE 0.9 % IV SOLN: INTRAVENOUS | Status: DC

## 2011-08-01 MED ORDER — SODIUM CHLORIDE 0.9 % IV BOLUS (SEPSIS)
250.0000 mL | Freq: Once | INTRAVENOUS | Status: AC
  Administered 2011-08-01: 250 mL via INTRAVENOUS

## 2011-08-01 MED ORDER — IOHEXOL 300 MG/ML  SOLN
100.0000 mL | Freq: Once | INTRAMUSCULAR | Status: AC | PRN
  Administered 2011-08-01: 100 mL via INTRAVENOUS

## 2011-08-01 MED ORDER — HYDROMORPHONE HCL PF 1 MG/ML IJ SOLN
1.0000 mg | Freq: Once | INTRAMUSCULAR | Status: AC
  Administered 2011-08-01: 1 mg via INTRAVENOUS
  Filled 2011-08-01: qty 1

## 2011-08-01 NOTE — ED Notes (Signed)
Pt says pain is worse with movement.

## 2011-08-01 NOTE — Discharge Instructions (Signed)
Workup in the emergency department shows an elevated lipase show some gallstones in the gallbladder without evidence of gallbladder infection. Today symptoms not consistent with biliary colic. Followup with GI medicine referral made call for appointment. Take pain medicine as directed take antinausea medicine as needed. Return for any new or worse symptoms. Use resource guide to help find a primary care doctor.   RESOURCE GUIDE  Dental Problems  Patients with Medicaid: [facility] [phone] [address]                                           [phone] [address] Phone:  [phone]                                                  Phone:  [phone]  If unable to pay or uninsured, contact:  [facility] or [facility]. to become qualified for the adult dental clinic.  Chronic Pain Problems Contact [facility]  [phone] Patients need to be referred by their primary care doctor.  Insufficient Money for Medicine Contact [employer]:  call "211" or [facility] 913-453-1094.  No Primary Care Doctor Call [facility]  904-227-4362 Other agencies that provide inexpensive medical care    [facility]  [phone]    [facility]  [phone]    [facility]  858-039-9370    [facility]  [phone]    [facility]  [phone]    [facility]  [phone]  Psychological Services [facility] Behavioral Health  [phone] [facility]  [phone] [facility]   [phone] (emergency services 512 076 2088)  Substance Abuse Resources [facility]  3128652172 [facility] [phone] [facility] 571-086-1506 Floydene Flock (613) 809-2591 Residential & Outpatient Substance Abuse Program  [phone]  Abuse/Neglect [facility] Child Abuse Hotline 508-381-8122 [facility] Child Abuse Hotline (337)362-5000 (After  Hours)  Emergency Shelter [facility] 318 285 1478  Maternity Homes Room at the [place] [facility] 619-503-1278 [facility] 681-737-4214  MRSA Hotline #:   603 216 1794    [facility] Resources  [facility]     [employer]                          [facility] Dept. [address] [place]                       [address]      [address] [place] [address]  [place]                                                Cristobal Goldmann Phone:  [phone]                                   Phone:  [phone]                 Phone:  [phone]  [facility] Mental Health Phone:  ([phone]  [facility] Child Abuse Hotline 209-706-2937 3612163445 (After Hours)

## 2011-08-01 NOTE — ED Notes (Signed)
Pt reports has been on depakote for schizophrenia but has been off of it for 2 weeks.

## 2011-08-01 NOTE — ED Provider Notes (Signed)
History     CSN: [id number]  Arrival date & time 08/01/11  1422   First MD Initiated Contact with Patient 08/01/11 1446      Chief Complaint  Patient presents with  . Back Pain  . Chest Pain    (Consider location/radiation/quality/duration/timing/severity/associated sxs/prior treatment) The history is provided by the patient.   patient presents to the emergency department with complaint of generalized bodyaches that started today specifically with complaint of pain under the left arm in the axillary area low back pain intermittent chest pain that's worse with movement of the arm. In T. also some shortness of breath some nausea but no vomiting denies abdominal pain fever or chills or history of similar problems. Past medical history significant for bipolar disorder and schizophrenia schizoaffective disorder. Surgical history significant for an appendectomy in the past. Patient describes the pain as a 5/10 at worse currently very minimal. Pain is described as sharp and dull.  Patient is an everyday smoker and does admit to marijuana use. Denies any alcohol use.  Past Medical History  Diagnosis Date  . Bipolar 1 disorder   . Schizophrenia, schizo-affective     Past Surgical History  Procedure Date  . Appendectomy     No family history on file.  History  Substance Use Topics  . Smoking status: Current Everyday Smoker -- 1.5 packs/day for 15 years    Types: Cigarettes  . Smokeless tobacco: Not on file  . Alcohol Use: No      Review of Systems  Constitutional: Negative for fever, chills and diaphoresis.  HENT: Negative for congestion, sore throat and neck pain.   Eyes: Negative for redness.  Respiratory: Positive for shortness of breath.   Cardiovascular: Positive for chest pain. Negative for leg swelling.  Gastrointestinal: Positive for nausea. Negative for vomiting, abdominal pain and diarrhea.  Genitourinary: Negative for dysuria.  Musculoskeletal: Positive for  myalgias. Negative for back pain.  Skin: Negative for rash.  Neurological: Negative for headaches.  Hematological: Does not bruise/bleed easily.  Psychiatric/Behavioral: Negative for confusion.    Allergies  Review of patient's allergies indicates no known allergies.  Home Medications   Current Outpatient Rx  Name Route Sig Dispense Refill  . DIVALPROEX SODIUM ER 500 MG PO TB24 Oral Take 1,000 mg by mouth 2 (two) times daily. For mood stability.    . QUETIAPINE FUMARATE 200 MG PO TABS Oral Take 600 mg by mouth at bedtime.    . SERTRALINE HCL 100 MG PO TABS Oral Take 1 tablet (100 mg total) by mouth daily. For depression 30 tablet 0  . HYDROCODONE-ACETAMINOPHEN 5-325 MG PO TABS Oral Take 1-2 tablets by mouth every 6 (six) hours as needed for pain. 10 tablet 0  . PROMETHAZINE HCL 25 MG PO TABS Oral Take 1 tablet (25 mg total) by mouth every 6 (six) hours as needed for nausea. 12 tablet 0  . QUETIAPINE FUMARATE 100 MG PO TABS Oral Take 1 tablet (100 mg total) by mouth at bedtime. Take with a 400mg  tablet daily at bedtime for a total of 500mg  daily at bedtime.  For voices. 30 tablet 0    BP 132/86  Pulse 70  Temp(Src) 97.8 F (36.6 C) (Oral)  Resp 16  Ht 5\' 11"  (1.803 m)  Wt 145 lb (65.772 kg)  BMI 20.22 kg/m2  SpO2 100%  Physical Exam  Nursing note and vitals reviewed. Constitutional: He is oriented to person, place, and time. He appears well-developed and well-nourished. No distress.  HENT:  Head: Normocephalic and atraumatic.  Mouth/Throat: Oropharynx is clear and moist.  Eyes: Conjunctivae and EOM are normal. Pupils are equal, round, and reactive to light.  Neck: Normal range of motion. Neck supple.  Cardiovascular: Normal rate, regular rhythm, normal heart sounds and intact distal pulses.   No murmur heard. Pulmonary/Chest: Effort normal and breath sounds normal. No respiratory distress. He has no wheezes. He exhibits no tenderness.  Abdominal: There is no tenderness.    Musculoskeletal: Normal range of motion. He exhibits no edema.  Lymphadenopathy:    He has no cervical adenopathy.  Neurological: He is alert and oriented to person, place, and time. He has normal reflexes. No cranial nerve deficit. Coordination normal.  Skin: Skin is warm. No rash noted.    ED Course  Procedures (including critical care time)  Labs Reviewed  BASIC METABOLIC PANEL - Abnormal; Notable for the following:    Glucose, Bld 101 (*)    GFR calc non Af Amer 81 (*)    All other components within normal limits  LIPASE, BLOOD - Abnormal; Notable for the following:    Lipase 188 (*)    All other components within normal limits  CBC  TROPONIN I  HEPATIC FUNCTION PANEL  URINALYSIS, ROUTINE W REFLEX MICROSCOPIC  LAB REPORT - SCANNED   No results found.  Date: 08/01/2011  Rate: 66  Rhythm: normal sinus rhythm  QRS Axis: normal  Intervals: normal  ST/T Wave abnormalities: normal  Conduction Disutrbances:none  Narrative Interpretation:   Old EKG Reviewed: none available  Results for orders placed during the hospital encounter of 08/01/11  CBC      Component Value Range   WBC 6.1  4.0 - 10.5 (K/uL)   RBC 4.42  4.22 - 5.81 (MIL/uL)   Hemoglobin 13.8  13.0 - 17.0 (g/dL)   HCT 16.1  09.6 - 04.5 (%)   MCV 91.4  78.0 - 100.0 (fL)   MCH 31.2  26.0 - 34.0 (pg)   MCHC 34.2  30.0 - 36.0 (g/dL)   RDW 40.9  81.1 - 91.4 (%)   Platelets 183  150 - 400 (K/uL)  BASIC METABOLIC PANEL      Component Value Range   Sodium 138  135 - 145 (mEq/L)   Potassium 4.1  3.5 - 5.1 (mEq/L)   Chloride 104  96 - 112 (mEq/L)   CO2 26  19 - 32 (mEq/L)   Glucose, Bld 101 (*) 70 - 99 (mg/dL)   BUN 13  6 - 23 (mg/dL)   Creatinine, Ser 7.82  0.50 - 1.35 (mg/dL)   Calcium 9.3  8.4 - 95.6 (mg/dL)   GFR calc non Af Amer 81 (*) >90 (mL/min)   GFR calc Af Amer >90  >90 (mL/min)  TROPONIN I      Component Value Range   Troponin I <0.30  <0.30 (ng/mL)  HEPATIC FUNCTION PANEL      Component Value  Range   Total Protein 6.8  6.0 - 8.3 (g/dL)   Albumin 3.9  3.5 - 5.2 (g/dL)   AST 16  0 - 37 (U/L)   ALT 11  0 - 53 (U/L)   Alkaline Phosphatase 46  39 - 117 (U/L)   Total Bilirubin 0.3  0.3 - 1.2 (mg/dL)   Bilirubin, Direct <2.1  0.0 - 0.3 (mg/dL)   Indirect Bilirubin NOT CALCULATED  0.3 - 0.9 (mg/dL)  LIPASE, BLOOD      Component Value Range   Lipase 188 (*) 11 -  59 (U/L)  URINALYSIS, ROUTINE W REFLEX MICROSCOPIC      Component Value Range   Color, Urine YELLOW  YELLOW    APPearance CLEAR  CLEAR    Specific Gravity, Urine 1.020  1.005 - 1.030    pH 7.5  5.0 - 8.0    Glucose, UA NEGATIVE  NEGATIVE (mg/dL)   Hgb urine dipstick NEGATIVE  NEGATIVE    Bilirubin Urine NEGATIVE  NEGATIVE    Ketones, ur NEGATIVE  NEGATIVE (mg/dL)   Protein, ur NEGATIVE  NEGATIVE (mg/dL)   Urobilinogen, UA 0.2  0.0 - 1.0 (mg/dL)   Nitrite NEGATIVE  NEGATIVE    Leukocytes, UA NEGATIVE  NEGATIVE    Results for orders placed during the hospital encounter of 08/01/11  CBC      Component Value Range   WBC 6.1  4.0 - 10.5 (K/uL)   RBC 4.42  4.22 - 5.81 (MIL/uL)   Hemoglobin 13.8  13.0 - 17.0 (g/dL)   HCT 33.8  25.0 - 53.9 (%)   MCV 91.4  78.0 - 100.0 (fL)   MCH 31.2  26.0 - 34.0 (pg)   MCHC 34.2  30.0 - 36.0 (g/dL)   RDW 76.7  34.1 - 93.7 (%)   Platelets 183  150 - 400 (K/uL)  BASIC METABOLIC PANEL      Component Value Range   Sodium 138  135 - 145 (mEq/L)   Potassium 4.1  3.5 - 5.1 (mEq/L)   Chloride 104  96 - 112 (mEq/L)   CO2 26  19 - 32 (mEq/L)   Glucose, Bld 101 (*) 70 - 99 (mg/dL)   BUN 13  6 - 23 (mg/dL)   Creatinine, Ser 9.02  0.50 - 1.35 (mg/dL)   Calcium 9.3  8.4 - 40.9 (mg/dL)   GFR calc non Af Amer 81 (*) >90 (mL/min)   GFR calc Af Amer >90  >90 (mL/min)  TROPONIN I      Component Value Range   Troponin I <0.30  <0.30 (ng/mL)  HEPATIC FUNCTION PANEL      Component Value Range   Total Protein 6.8  6.0 - 8.3 (g/dL)   Albumin 3.9  3.5 - 5.2 (g/dL)   AST 16  0 - 37 (U/L)   ALT 11   0 - 53 (U/L)   Alkaline Phosphatase 46  39 - 117 (U/L)   Total Bilirubin 0.3  0.3 - 1.2 (mg/dL)   Bilirubin, Direct <7.3  0.0 - 0.3 (mg/dL)   Indirect Bilirubin NOT CALCULATED  0.3 - 0.9 (mg/dL)  LIPASE, BLOOD      Component Value Range   Lipase 188 (*) 11 - 59 (U/L)  URINALYSIS, ROUTINE W REFLEX MICROSCOPIC      Component Value Range   Color, Urine YELLOW  YELLOW    APPearance CLEAR  CLEAR    Specific Gravity, Urine 1.020  1.005 - 1.030    pH 7.5  5.0 - 8.0    Glucose, UA NEGATIVE  NEGATIVE (mg/dL)   Hgb urine dipstick NEGATIVE  NEGATIVE    Bilirubin Urine NEGATIVE  NEGATIVE    Ketones, ur NEGATIVE  NEGATIVE (mg/dL)   Protein, ur NEGATIVE  NEGATIVE (mg/dL)   Urobilinogen, UA 0.2  0.0 - 1.0 (mg/dL)   Nitrite NEGATIVE  NEGATIVE    Leukocytes, UA NEGATIVE  NEGATIVE    Dg Chest 2 View  08/01/2011  *RADIOLOGY REPORT*  Clinical Data: Chest pain  CHEST - 2 VIEW  Comparison: 12/24/2009  Findings: The heart  size and mediastinal contours are within normal limits.  Both lungs are clear.  The visualized skeletal structures are unremarkable.  IMPRESSION: Negative exam.  Original Report Authenticated By: Rosealee Albee, M.D.   Ct Abdomen Pelvis W Contrast  08/01/2011  *RADIOLOGY REPORT*  Clinical Data: Pain and nausea.  Prior appendectomy.  CT ABDOMEN AND PELVIS WITH CONTRAST  Technique:  Multidetector CT imaging of the abdomen and pelvis was performed following the standard protocol during bolus administration of intravenous contrast.  Contrast: OMNIPAQUE IOHEXOL 300 MG/ML  SOLN  Comparison: 07/18/2009  Findings: Lung bases are clear.  Gallstones noted without other CT evidence for acute cholecystitis. Liver, adrenal glands,, right kidney, spleen, and pancreas are normal.  Nonobstructing 2 mm left upper renal pole calculus image 21.  Alternatively, this could represent early excretion of contrast.  No hydronephrosis or radiopaque ureteral or bladder calculus.  Bladder is normal.  Appendix not  visualized, compatible with reported surgical absence.  No bowel wall thickening or focal segmental dilatation.  No free fluid or lymphadenopathy.  No free air.  No acute osseous finding.  IMPRESSION: No acute intra-abdominal or pelvic pathology.  Original Report Authenticated By: Harrel Lemon, M.D.      1. Pancreatitis   2. Gallstones       MDM   Patient's findings different than main complaint which was mostly pain under left arm for arm and lower back pain no real abdominal pain. However her lab workup shows elevation in lipase without any significant LFT abnormalities CT scan was done to evaluate further the pancreas and to rule out any gallbladder finding which we did find gallstones without evidence of acute cholecystitis. On reexamination patient's abdomen completely nontender again is noted no leukocytosis patient may have passed a gallstone causing the elevation in the lipase will need followup with GI medicine which was provided to the patient. Patient tolerating by mouth fine however did recommend clear liquid diet for one to 2 days. Evaluation of other concerns including the atypical chest pain was negative for any acute findings on the troponin or EKG. Chest x-ray also negative for pneumonia or pneumothorax CT findings as described above.       Shelda Jakes, MD 08/04/11 [phone]

## 2011-08-01 NOTE — ED Notes (Signed)
Pt alert & oriented x4, stable gait. Pt given discharge instructions, paperwork & prescription(s). Patient instructed to stop at the registration desk to finish any additional paperwork. pt verbalized understanding. Pt left department w/ no further questions.  

## 2011-08-01 NOTE — ED Notes (Signed)
Pt reports woke up with generalized body aches but specifically c/o pain in left underarm, lower back pain, intermittent chest pain, and fatigue.  C/o SOB and Nausea.  Denies vomiting.

## 2011-08-05 ENCOUNTER — Encounter: Payer: Self-pay | Admitting: Internal Medicine

## 2011-08-06 ENCOUNTER — Encounter: Payer: Self-pay | Admitting: Gastroenterology

## 2011-08-06 ENCOUNTER — Ambulatory Visit (INDEPENDENT_AMBULATORY_CARE_PROVIDER_SITE_OTHER): Payer: Self-pay | Admitting: Gastroenterology

## 2011-08-06 VITALS — BP 123/78 | HR 73 | Temp 97.8°F | Ht 71.0 in | Wt 147.6 lb

## 2011-08-06 DIAGNOSIS — K859 Acute pancreatitis without necrosis or infection, unspecified: Secondary | ICD-10-CM

## 2011-08-06 DIAGNOSIS — K8020 Calculus of gallbladder without cholecystitis without obstruction: Secondary | ICD-10-CM

## 2011-08-06 NOTE — Progress Notes (Signed)
No PCP on file 

## 2011-08-06 NOTE — Progress Notes (Signed)
Primary Care Physician:  No primary provider on file.  Primary Gastroenterologist:  Roetta Sessions, MD   Chief Complaint  Patient presents with  . Pancreatitis    HPI:  Isaiah Peterson is a 36 y.o. male here for further evaluation of recent pancreatitis. Seen in ED, 08/01/11, and advised to see [place]. Patient reports episode of acute onset anorexia, body aches, started epigastrium and radiated into back. Lasted for about three days. Finally started eating yesterday. Similar episode, with elevated lipase at [facility] ED about one year ago, lipase only 68 at that time. No etoh at all in 8-9 years. Smokes marijuana monthly. Trying to quit. Some pp abdominal pain to certain foods (fatty, dairy products). Symptoms come and go. Known gallstones for years. Weight about the same. No heartburn. BM normal. No melena, brbpr. No clay colored stool.     Current Outpatient Prescriptions  Medication Sig Dispense Refill  . divalproex (DEPAKOTE ER) 500 MG 24 hr tablet Take 1,000 mg by mouth 2 (two) times daily. For mood stability.      Marland Kitchen HYDROcodone-acetaminophen (NORCO) 5-325 MG per tablet Take 1-2 tablets by mouth every 6 (six) hours as needed for pain.  10 tablet  0  . promethazine (PHENERGAN) 25 MG tablet Take 1 tablet (25 mg total) by mouth every 6 (six) hours as needed for nausea.  12 tablet  0  . QUEtiapine (SEROQUEL) 200 MG tablet Take 600 mg by mouth at bedtime.      . sertraline (ZOLOFT) 100 MG tablet Take 1 tablet (100 mg total) by mouth daily. For depression  30 tablet  0  . QUEtiapine (SEROQUEL) 100 MG tablet Take 1 tablet (100 mg total) by mouth at bedtime. Take with a 400mg  tablet daily at bedtime for a total of 500mg  daily at bedtime.  For voices.  30 tablet  0    Allergies as of 08/06/2011  . (No Known Allergies)    Past Medical History  Diagnosis Date  . Bipolar 1 disorder   . Schizophrenia, schizo-affective     Past Surgical History  Procedure Date  . Appendectomy   .  Esophagogastroduodenoscopy 07/11/2006    Esophageal foreign body (chicken bone), status post removal, as described above.  Complete esophagogastroduodenoscopy not carried out    Family History  Problem Relation Age of Onset  . Aneurysm Mother     brain  . Colon cancer Maternal Uncle     great uncle  . Liver disease Mother   . Pancreatitis Maternal Uncle     died age 10  . Stomach cancer Maternal Aunt     great aunt    History   Social History  . Marital Status: Single    Spouse Name: N/A    Number of Children: N/A  . Years of Education: N/A   Occupational History  . Not on file.   Social History Main Topics  . Smoking status: Current Everyday Smoker -- 1.0 packs/day for 15 years    Types: Cigarettes  . Smokeless tobacco: Not on file  . Alcohol Use: No     former quit about 8-9 years ago  . Drug Use: Yes    Special: Marijuana  . Sexually Active: Yes   Other Topics Concern  . Not on file   Social History Narrative  . No narrative on file      ROS:  General: Negative for anorexia, weight loss, fever, chills, fatigue, weakness. Eyes: Negative for vision changes.  ENT: Negative for hoarseness,  difficulty swallowing , nasal congestion. No teeth. Dentures are uncomfortable. CV: Negative for chest pain, angina, palpitations, dyspnea on exertion, peripheral edema.  Respiratory: Negative for dyspnea at rest, dyspnea on exertion, cough, sputum, wheezing.  GI: See history of present illness. GU:  Negative for dysuria, hematuria, urinary incontinence, urinary frequency, nocturnal urination.  MS: Negative for joint pain, low back pain.  Derm: Negative for rash or itching.  Neuro: Negative for weakness, abnormal sensation, seizure, frequent headaches, memory loss, confusion.  Psych: Negative for anxiety, depression, suicidal ideation, hallucinations.  Endo: Negative for unusual weight change.  Heme: Negative for bruising or bleeding. Allergy: Negative for rash or  hives.    Physical Examination:  BP 123/78  Pulse 73  Temp(Src) 97.8 F (36.6 C) (Temporal)  Ht 5\' 11"  (1.803 m)  Wt 147 lb 9.6 oz (66.951 kg)  BMI 20.59 kg/m2   General: Well-nourished, well-developed in no acute distress.  Head: Normocephalic, atraumatic.   Eyes: Conjunctiva pink, no icterus. Mouth: Oropharyngeal mucosa moist and pink , no lesions erythema or exudate. Edentulous. Neck: Supple without thyromegaly, masses, or lymphadenopathy.  Lungs: Clear to auscultation bilaterally.  Heart: Regular rate and rhythm, no murmurs rubs or gallops.  Abdomen: Bowel sounds are normal, thin, mild epig tenderness, nondistended, no hepatosplenomegaly or masses, no abdominal bruits or    hernia , no rebound or guarding.   Rectal: not performed Extremities: No lower extremity edema. No clubbing or deformities.  Neuro: Alert and oriented x 4 , grossly normal neurologically.  Skin: Warm and dry, no rash or jaundice.   Psych: Alert and cooperative, normal mood and affect.  Labs: Lab Results  Component Value Date   CREATININE 1.16 08/01/2011   BUN 13 08/01/2011   NA 138 08/01/2011   K 4.1 08/01/2011   CL 104 08/01/2011   CO2 26 08/01/2011   Lab Results  Component Value Date   ALT 11 08/01/2011   AST 16 08/01/2011   ALKPHOS 46 08/01/2011   BILITOT 0.3 08/01/2011   Lab Results  Component Value Date   WBC 6.1 08/01/2011   HGB 13.8 08/01/2011   HCT 40.4 08/01/2011   MCV 91.4 08/01/2011   PLT 183 08/01/2011   Lab Results  Component Value Date   LIPASE 188* 08/01/2011     Imaging Studies: Dg Chest 2 View  08/01/2011  *RADIOLOGY REPORT*  Clinical Data: Chest pain  CHEST - 2 VIEW  Comparison: 12/24/2009  Findings: The heart size and mediastinal contours are within normal limits.  Both lungs are clear.  The visualized skeletal structures are unremarkable.  IMPRESSION: Negative exam.  Original Report Authenticated By: Rosealee Albee, M.D.   Ct Abdomen Pelvis W Contrast  08/01/2011  *RADIOLOGY  REPORT*  Clinical Data: Pain and nausea.  Prior appendectomy.  CT ABDOMEN AND PELVIS WITH CONTRAST  Technique:  Multidetector CT imaging of the abdomen and pelvis was performed following the standard protocol during bolus administration of intravenous contrast.  Contrast: OMNIPAQUE IOHEXOL 300 MG/ML  SOLN  Comparison: 07/18/2009  Findings: Lung bases are clear.  Gallstones noted without other CT evidence for acute cholecystitis. Liver, adrenal glands,, right kidney, spleen, and pancreas are normal.  Nonobstructing 2 mm left upper renal pole calculus image 21.  Alternatively, this could represent early excretion of contrast.  No hydronephrosis or radiopaque ureteral or bladder calculus.  Bladder is normal.  Appendix not visualized, compatible with reported surgical absence.  No bowel wall thickening or focal segmental dilatation.  No free fluid  or lymphadenopathy.  No free air.  No acute osseous finding.  IMPRESSION: No acute intra-abdominal or pelvic pathology.  Original Report Authenticated By: Harrel Lemon, M.D.

## 2011-08-06 NOTE — Patient Instructions (Signed)
Please consume low-fat/low-greasy diet. If you develop recurrent abdominal pain, cut back to liquid diet for 24 hours, ie jello, soft drink, juice, broth. For persistent pain, call us or go to ER.  I will discuss your case with Dr. Jena Gauss and then call you with further instructions.

## 2011-08-06 NOTE — Assessment & Plan Note (Signed)
Recent mild pancreatitis, likely gallstone related. Patient denies etoh use in more than 8-9 years. Some chronic intermittent pp abd pain. FH of significant pancreatitis, uncle deceased in 41s. Details unavailable, but patient reports that he consumed etoh.   Will discuss further with Dr. Jena Gauss. Consider cholecystectomy as next step??? Patient currently uninsured but hopes to have insurance by this summer.

## 2011-09-11 NOTE — Progress Notes (Signed)
Please let patient know. Discussed case with Dr. Jena Gauss. Next step would be elective cholecystectomy. At OV he stated he was going to be insured soon. Whenever he is ready, we can make referral. He should keep [place] posted if any further abd pain.

## 2011-09-12 NOTE — Progress Notes (Signed)
Pt is aware and stated he will call back when he is ready.

## 2012-01-18 ENCOUNTER — Emergency Department (HOSPITAL_COMMUNITY): Payer: Medicare Other

## 2012-01-18 ENCOUNTER — Emergency Department (HOSPITAL_COMMUNITY)
Admission: EM | Admit: 2012-01-18 | Discharge: 2012-01-18 | Disposition: A | Discharge status: Home / Self Care | Payer: Medicare Other | Attending: Emergency Medicine | Admitting: Emergency Medicine

## 2012-01-18 ENCOUNTER — Encounter (HOSPITAL_COMMUNITY): Payer: Self-pay | Admitting: *Deleted

## 2012-01-18 DIAGNOSIS — J069 Acute upper respiratory infection, unspecified: Secondary | ICD-10-CM | POA: Insufficient documentation

## 2012-01-18 DIAGNOSIS — Z79899 Other long term (current) drug therapy: Secondary | ICD-10-CM | POA: Insufficient documentation

## 2012-01-18 DIAGNOSIS — J40 Bronchitis, not specified as acute or chronic: Secondary | ICD-10-CM | POA: Insufficient documentation

## 2012-01-18 DIAGNOSIS — J029 Acute pharyngitis, unspecified: Secondary | ICD-10-CM

## 2012-01-18 DIAGNOSIS — M549 Dorsalgia, unspecified: Secondary | ICD-10-CM | POA: Insufficient documentation

## 2012-01-18 HISTORY — DX: Calculus of gallbladder without cholecystitis without obstruction: K80.20

## 2012-01-18 LAB — RAPID STREP SCREEN (MED CTR MEBANE ONLY): Streptococcus, Group A Screen (Direct): NEGATIVE

## 2012-01-18 MED ORDER — ALBUTEROL SULFATE HFA 108 (90 BASE) MCG/ACT IN AERS
2.0000 | INHALATION_SPRAY | Freq: Four times a day (QID) | RESPIRATORY_TRACT | Status: DC
  Administered 2012-01-18: 2 via RESPIRATORY_TRACT
  Filled 2012-01-18: qty 6.7

## 2012-01-18 MED ORDER — MUCINEX DM 30-600 MG PO TB12: 1.0000 | ORAL_TABLET | Freq: Two times a day (BID) | ORAL | Status: DC

## 2012-01-18 NOTE — ED Provider Notes (Addendum)
History     CSN: [id number]  Arrival date & time 01/18/12  4098   First MD Initiated Contact with Patient 01/18/12 1952      Chief Complaint  Patient presents with  . Cough  . Fever  . Diarrhea    yesterday    (Consider location/radiation/quality/duration/timing/severity/associated sxs/prior treatment) The history is provided by the patient.  patient is a 36 year old male presents with a three-day history of cough and fever back pain to the chest area cough is nonproductive. Had diarrhea yesterday but this resolved. Currently temperature is 98.2. Also associated with a mild sore throat.     Past Medical History  Diagnosis Date  . Bipolar 1 disorder   . Schizophrenia, schizo-affective   . Gallstones     Past Surgical History  Procedure Date  . Appendectomy   . Esophagogastroduodenoscopy 07/11/2006    Esophageal foreign body (chicken bone), status post removal, as described above.  Complete esophagogastroduodenoscopy not carried out    Family History  Problem Relation Age of Onset  . Aneurysm Mother     brain  . Colon cancer Maternal Uncle     great uncle  . Liver disease Mother   . Pancreatitis Maternal Uncle     died age 40  . Stomach cancer Maternal Aunt     great aunt    History  Substance Use Topics  . Smoking status: Current Every Day Smoker -- 1.0 packs/day for 15 years    Types: Cigarettes  . Smokeless tobacco: Not on file  . Alcohol Use: No     former quit about 8-9 years ago      Review of Systems  Constitutional: Positive for fever.  HENT: Positive for sore throat. Negative for congestion and neck pain.   Respiratory: Negative for shortness of breath.   Cardiovascular: Negative for chest pain.  Gastrointestinal: Positive for diarrhea. Negative for nausea, vomiting and abdominal pain.  Genitourinary: Negative for dysuria.  Musculoskeletal: Positive for back pain.  Skin: Negative for rash.  Neurological: Negative for headaches.    Hematological: Does not bruise/bleed easily.    Allergies  Review of patient's allergies indicates no known allergies.  Home Medications   Current Outpatient Rx  Name Route Sig Dispense Refill  . DIVALPROEX SODIUM ER 500 MG PO TB24 Oral Take 1,000 mg by mouth 2 (two) times daily. For mood stability.    Marland Kitchen PROMETHAZINE HCL 25 MG PO TABS Oral Take 1 tablet (25 mg total) by mouth every 6 (six) hours as needed for nausea. 12 tablet 0  . QUETIAPINE FUMARATE 100 MG PO TABS Oral Take 1 tablet (100 mg total) by mouth at bedtime. Take with a 400mg  tablet daily at bedtime for a total of 500mg  daily at bedtime.  For voices. 30 tablet 0  . QUETIAPINE FUMARATE 200 MG PO TABS Oral Take 600 mg by mouth at bedtime.    . SERTRALINE HCL 100 MG PO TABS Oral Take 1 tablet (100 mg total) by mouth daily. For depression 30 tablet 0    BP 132/85  Pulse 86  Temp 98.2 F (36.8 C) (Oral)  Resp 20  Ht 5\' 11"  (1.803 m)  Wt 128 lb (58.06 kg)  BMI 17.85 kg/m2  SpO2 99%  Physical Exam  Nursing note and vitals reviewed. Constitutional: He is oriented to person, place, and time. He appears well-developed and well-nourished. No distress.  HENT:  Head: Normocephalic and atraumatic.  Mouth/Throat: Oropharynx is clear and moist.  Pharynx erythematous with a low bit of tonsillar exudate on the left side.  Eyes: Conjunctivae normal are normal. Pupils are equal, round, and reactive to light.  Neck: Normal range of motion. Neck supple.  Cardiovascular: Normal rate, regular rhythm and normal heart sounds.   No murmur heard. Pulmonary/Chest: Effort normal and breath sounds normal. No respiratory distress. He has no wheezes. He has no rales.  Abdominal: Soft. Bowel sounds are normal. There is no tenderness.  Musculoskeletal: Normal range of motion.  Neurological: He is alert and oriented to person, place, and time. No cranial nerve deficit. He exhibits normal muscle tone. Coordination normal.  Skin: Skin is warm.  No rash noted.    ED Course  Procedures (including critical care time)   Labs Reviewed  RAPID STREP SCREEN   Dg Chest 2 View  01/18/2012  *RADIOLOGY REPORT*  Clinical Data: Cough.  Fever.  Diarrhea.  Tobacco use.  CHEST - 2 VIEW  Comparison: 08/01/2011  Findings: Linear retrocardiac density is similar to prior and likely vascular.  The lungs appear clear. Cardiac and mediastinal contours appear unremarkable.  No pleural effusion observed.  IMPRESSION:  1.  No significant abnormality identified.   Original Report Authenticated By: Dellia Cloud, M.D.      1. Upper respiratory infection   2. Bronchitis   3. Pharyngitis       MDM  Patient with 3 day history of cough fever nonproductive cough sore throat mild chest x-rays negative for pneumonia strep test is pending. If negative will treat as if a bronchitis.  Rapid strep test is negative. Patient does not have strep pharyngitis we'll treat as a viral bronchitis.        Shelda Jakes, MD 01/18/12 2113  Shelda Jakes, MD 01/19/12 1213

## 2012-01-18 NOTE — ED Notes (Signed)
For past 3 days pt with cough and fever, back pain with coughing, diarrhea yesterday but none today, dry cough per pt.

## 2012-01-18 NOTE — ED Notes (Addendum)
States he has had a cold with cough and congestion for 4 or 5 days.  States his cough is non productive, smokes a pack a day.  States he occasionally feels short of breath while at rest.  Lung clear to ausculation bilaterally.  No acute respiratory distress noted, no tachypnea.

## 2012-02-18 ENCOUNTER — Encounter: Payer: Self-pay | Admitting: Internal Medicine

## 2012-02-19 ENCOUNTER — Encounter: Payer: Self-pay | Admitting: Gastroenterology

## 2012-02-19 ENCOUNTER — Ambulatory Visit (INDEPENDENT_AMBULATORY_CARE_PROVIDER_SITE_OTHER): Payer: Medicaid Other | Admitting: Gastroenterology

## 2012-02-19 ENCOUNTER — Telehealth: Payer: Self-pay | Admitting: Gastroenterology

## 2012-02-19 VITALS — BP 133/80 | HR 70 | Temp 97.8°F | Ht 71.0 in | Wt 134.4 lb

## 2012-02-19 DIAGNOSIS — K859 Acute pancreatitis without necrosis or infection, unspecified: Secondary | ICD-10-CM

## 2012-02-19 DIAGNOSIS — K8020 Calculus of gallbladder without cholecystitis without obstruction: Secondary | ICD-10-CM

## 2012-02-19 NOTE — Progress Notes (Signed)
Primary Care Physician:  No primary provider on file.  Primary Gastroenterologist:  Roetta Sessions, MD   Chief Complaint  Patient presents with  . Abdominal Pain    HPI:  Isaiah Peterson is a 36 y.o. male here for followup of abdominal pain. Last seen in 07/2011 for ? Biliary pancreatitis. H/O mildly elevated lipase of 188 in 07/2011 and 68 in 12/2009. CT showed gallstones but pancreas appeared normal. Patient denies etoh use in over 9 years. Uncle deceased with pancreatitis in his 30s. Discussed with Dr. Jena Gauss at that time and we recommended patient undergo elective cholecystectomy but patient wanted to postpone until he had insurance.   He now has Medicaid. He may appointment today to pursue referral for cholecystectomy. He continues to have recurrent episodes of epigastric pain usually postprandially. Usually every 1-2 weeks. Changes diet when occurs until settles down. Typically goes to a clear liquid diet for a few days. No heartburn, dysphagia. BM okay. No melena, brbpr. Takes ibuprofen as needed. Weight in 07/2011 was 147lb. Has been as low as 128lb. Currently 134lb. No ASA/BCs/Goody's.   Has been off his medications for bipolar disorder and schizophrenia for a couple of months. Not going back to [facility]. Not taking Lexapro, seroquel, zoloft, depakote. Currently looking for PCP to get referral for psychiatric disease.   Current Outpatient Prescriptions  Medication Sig Dispense Refill  . Dextromethorphan-Guaifenesin (MUCINEX DM) 30-600 MG TB12 Take 1 tablet by mouth every 12 (twelve) hours.  14 each  0    Allergies as of 02/19/2012  . (No Known Allergies)    Past Medical History  Diagnosis Date  . Bipolar 1 disorder   . Schizophrenia, schizo-affective   . Gallstones     Past Surgical History  Procedure Date  . Appendectomy   . Esophagogastroduodenoscopy 07/11/2006    RMR: Esophageal foreign body (chicken bone), status post removal, as described above.  Complete  esophagogastroduodenoscopy not carried out    Family History  Problem Relation Age of Onset  . Aneurysm Mother     brain  . Colon cancer Maternal Uncle     great uncle  . Liver disease Mother   . Pancreatitis Maternal Uncle     died age 83  . Stomach cancer Maternal Aunt     great aunt    History   Social History  . Marital Status: Single    Spouse Name: N/A    Number of Children: N/A  . Years of Education: N/A   Occupational History  . Not on file.   Social History Main Topics  . Smoking status: Current Every Day Smoker -- 1.0 packs/day for 15 years    Types: Cigarettes  . Smokeless tobacco: Not on file  . Alcohol Use: No     Comment: former quit about 8-9 years ago  . Drug Use: No     Comment: quit marijuana 01/2012  . Sexually Active: Yes   Other Topics Concern  . Not on file   Social History Narrative  . No narrative on file      ROS:  General: Negative for anorexia, weight loss, fever, chills, fatigue, weakness.see history of present illness Eyes: Negative for vision changes.  ENT: Negative for hoarseness, difficulty swallowing , nasal congestion. CV: Negative for chest pain, angina, palpitations, dyspnea on exertion, peripheral edema.  Respiratory: Negative for dyspnea at rest, dyspnea on exertion, cough, sputum, wheezing.  GI: See history of present illness. GU:  Negative for dysuria, hematuria, urinary incontinence, urinary frequency,  nocturnal urination.  MS: Negative for joint pain, low back pain.  Derm: Negative for rash or itching.  Neuro: Negative for weakness, abnormal sensation, seizure, frequent headaches, memory loss, confusion.  Psych: Negative for anxiety, depression, suicidal ideation, hallucinations. States he's doing okay at this point from bipolar and schizophrenia disorder. Endo: Negative for unusual weight change. See history of present illness Heme: Negative for bruising or bleeding. Allergy: Negative for rash or hives.     Physical Examination:  BP 133/80  Pulse 70  Temp 97.8 F (36.6 C) (Temporal)  Ht 5\' 11"  (1.803 m)  Wt 134 lb 6.4 oz (60.963 kg)  BMI 18.74 kg/m2   General: Well-nourished, well-developed, but then white male in no acute distress. Accompanied by his fianc. Head: Normocephalic, atraumatic.   Eyes: Conjunctiva pink, no icterus. Mouth: Oropharyngeal mucosa moist and pink , no lesions erythema or exudate. Teeth in poor repair Neck: Supple without thyromegaly, masses, or lymphadenopathy.  Lungs: Clear to auscultation bilaterally.  Heart: Regular rate and rhythm, no murmurs rubs or gallops.  Abdomen: Bowel sounds are normal, mild epigastric tenderness, nondistended, no hepatosplenomegaly or masses, no abdominal bruits or    hernia , no rebound or guarding.   Rectal: not performed Extremities: No lower extremity edema. No clubbing or deformities.  Neuro: Alert and oriented x 4 , grossly normal neurologically.  Skin: Warm and dry, no rash or jaundice.   Psych: Alert and cooperative, normal mood and affect.

## 2012-02-19 NOTE — Assessment & Plan Note (Addendum)
Recurrent epigastric pain, postprandial likely secondary to symptomatic gallstones. Suspect he's had a couple episodes of biliary pancreatitis as outlined above. He really denies heartburn, early satiety, or other symptoms suggestive of gastritis or PUD. As before, would recommend elective cholecystectomy. If he were to have recurrent symptoms postoperatively, could consider upper endoscopy at that time.  Low-fat diet while waiting on referral. Call with questions or concerns.

## 2012-02-19 NOTE — Progress Notes (Signed)
No PCP on file 

## 2012-02-19 NOTE — Telephone Encounter (Signed)
Patient is scheduled with Dr. Cherylann Ratel on 11/19 @ 2:15 and he is aware

## 2012-02-19 NOTE — Patient Instructions (Addendum)
We are referring you to a surgeon to get your gallbladder removed. Please consume low-fat diet to prevent further episodes of abdominal pain. Call us if any further problems.

## 2012-02-25 ENCOUNTER — Encounter (HOSPITAL_COMMUNITY): Payer: Self-pay | Admitting: Pharmacy Technician

## 2012-02-25 NOTE — H&P (Signed)
  NTS SOAP Note  Vital Signs:  Vitals as of: 02/25/2012: Systolic 125: Diastolic 82: Heart Rate 66: Temp 97.62F: Height 38ft 11in: Weight 133Lbs 0 Ounces: Pain Level 8: BMI 19  BMI : 18.55 kg/m2  Subjective: This 36 Years 42 Months old Male presents for of gallstones.  States he has had epigastric pain intermittently for some time now.  Usually occurs every several weeks.  Was noted to have elevated lipase in 4/13.  CT scan shows gallstones.  Denies any current abdominal pain, jaundice, nausea, vomiting.  Does have some fatty food intolerance.  Review of Symptoms:    thirst Head:unremarkable    Eyes:unremarkable   Nose/Mouth/Throat:unremarkable Cardiovascular:  unremarkable   Respiratory:unremarkable   Gastrointestinal:  unremarkable   Genitourinary:unremarkable       neck, back pain Skin:unremarkable Hematolgic/Lymphatic:unremarkable     Allergic/Immunologic:unremarkable     Past Medical History:    Reviewed   Past Medical History  Surgical History: appy Psychiatric History: bipolar/schizophrenia Allergies: haldol Medications: unknown   Social History:Reviewed  Social History  Preferred Language: English Ethnicity: Not Hispanic / Latino Age: 36 Years 8 Months Marital Status:  S Alcohol:  No Recreational drug(s): marijuana, though states he quit 10/13   Smoking Status: Current every day smoker reviewed on 02/25/2012 Started Date: 04/08/1998 Packs per day: 1.00 Functional Status reviewed on mm/dd/yyyy ------------------------------------------------ Bathing: Normal Cooking: Normal Dressing: Normal Driving: Normal Eating: Normal Managing Meds: Normal Oral Care: Normal Shopping: Normal Toileting: Normal Transferring: Normal Walking: Normal Cognitive Status reviewed on mm/dd/yyyy ------------------------------------------------ Attention: Normal Decision Making: Normal Language: Normal Memory: Normal Motor:  Normal Perception: Normal Problem Solving: Normal Visual and Spatial: Normal   Family History:  Reviewed   Family History  Is there a family history [id number]    Objective Information: General:  Well appearing, well nourished in no distress.   no scleral icterus Neck:  Supple without lymphadenopathy.  Heart:  RRR, no murmur Lungs:    CTA bilaterally, no wheezes, rhonchi, rales.  Breathing unlabored. Abdomen:Soft, NT/ND, no HSM, no masses.  Assessment:Cholelithiasis, biliary colic  Diagnosis &amp; Procedure: DiagnosisCode: 574.20, ProcedureCode: 47829,    Plan:Scheduled for laparoscopic cholecystectomy, possible cholangiograms on 02/28/12.   Patient Education:Alternative treatments to surgery were discussed with patient (and family).  Risks and benefits  of procedure including bleeding, infection, hepatobiliary injury, and the possibility of an open proceduyre were fully explained to the patient (and family) who gave informed consent. Patient/family questions were addressed.  Follow-up:Pending Surgery

## 2012-02-27 ENCOUNTER — Encounter (HOSPITAL_COMMUNITY)
Admission: RE | Admit: 2012-02-27 | Discharge: 2012-02-27 | Disposition: A | Discharge status: Home / Self Care | Payer: Medicare Other | Source: Ambulatory Visit | Attending: General Surgery | Admitting: General Surgery

## 2012-02-27 ENCOUNTER — Encounter (HOSPITAL_COMMUNITY): Payer: Self-pay

## 2012-02-27 LAB — CBC WITH DIFFERENTIAL/PLATELET
Eosinophils Relative: 4 % (ref 0–5)
HCT: 41.8 % (ref 39.0–52.0)
Hemoglobin: 14.7 g/dL (ref 13.0–17.0)
Lymphocytes Relative: 31 % (ref 12–46)
Lymphs Abs: 2.4 10*3/uL (ref 0.7–4.0)
MCH: 31.9 pg (ref 26.0–34.0)
MCV: 90.7 fL (ref 78.0–100.0)
Monocytes Absolute: 0.6 10*3/uL (ref 0.1–1.0)
Monocytes Relative: 8 % (ref 3–12)
RBC: 4.61 MIL/uL (ref 4.22–5.81)
WBC: 7.7 10*3/uL (ref 4.0–10.5)

## 2012-02-27 LAB — HEPATIC FUNCTION PANEL
Albumin: 3.9 g/dL (ref 3.5–5.2)
Total Protein: 6.4 g/dL (ref 6.0–8.3)

## 2012-02-27 LAB — BASIC METABOLIC PANEL
CO2: 29 mEq/L (ref 19–32)
Chloride: 103 mEq/L (ref 96–112)
Glucose, Bld: 107 mg/dL — ABNORMAL HIGH (ref 70–99)
Sodium: 139 mEq/L (ref 135–145)

## 2012-02-27 LAB — SURGICAL PCR SCREEN: Staphylococcus aureus: NEGATIVE

## 2012-02-27 LAB — LIPASE, BLOOD: Lipase: 134 U/L — ABNORMAL HIGH (ref 11–59)

## 2012-02-27 MED ORDER — CHLORHEXIDINE GLUCONATE 4 % EX LIQD: 1.0000 "application " | Freq: Once | CUTANEOUS | Status: DC

## 2012-02-27 NOTE — Patient Instructions (Addendum)
Your procedure is scheduled on: 02/28/2012  Report to [facility] at 7:00    AM.  Call this number if you have problems the morning of surgery: [phone]   Remember:   Do not drink or eat food:After Midnight.  :  Take these medicines the morning of surgery with A SIP OF WATER: None   Do not wear jewelry, make-up or nail polish.  Do not wear lotions, powders, or perfumes. You may wear deodorant.  Do not shave 48 hours prior to surgery. Men may shave face and neck.  Do not bring valuables to the hospital.  Contacts, dentures or bridgework may not be worn into surgery.  Leave suitcase in the car. After surgery it may be brought to your room.  For patients admitted to the hospital, checkout time is 11:00 AM the day of discharge.   Patients discharged the day of surgery will not be allowed to drive home.    Special Instructions: Shower using CHG 2 nights before surgery and the night before surgery.  If you shower the day of surgery use CHG.  Use special wash - you have one bottle of CHG for all showers.  You should use approximately 1/3 of the bottle for each shower.   Please read over the following fact sheets that you were given: Pain Booklet, MRSA Information, Surgical Site Infection Prevention and Care and Recovery After Surgery   Laparoscopic Cholecystectomy Care After Refer to this sheet in the next few weeks. These instructions provide you with information on caring for yourself after your procedure. Your caregiver may also give you more specific instructions. Your treatment has been planned according to current medical practices, but problems sometimes occur. Call your caregiver if you have any problems or questions after your procedure. HOME CARE INSTRUCTIONS   Change bandages (dressings) as directed by your caregiver.  Keep the wound dry and clean. The wound may be washed gently with soap and water. Gently blot or dab the area dry.  Do not take baths or use swimming pools or hot  tubs for 10 days, or as instructed by your caregiver.  Only take over-the-counter or prescription medicines for pain, discomfort, or fever as directed by your caregiver.  Continue your normal diet as directed by your caregiver.  Do not lift anything heavier than 25 pounds (11.5 kg), or as directed by your caregiver.  Do not play contact sports for 1 week, or as directed by your caregiver. SEEK MEDICAL CARE IF:   There is redness, swelling, or increasing pain in the wound.  You notice yellowish-white fluid (pus) coming from the wound.  There is drainage from the wound that lasts longer than 1 day.  There is a bad smell coming from the wound or dressing.  The surgical cut (incision) breaks open. SEEK IMMEDIATE MEDICAL CARE IF:   You develop a rash.  You have difficulty breathing.  You develop chest pain.  You develop any reaction or side effects to medicines given.  You have a fever.  You have increasing pain in the shoulders (shoulder strap areas).  You have dizzy episodes or faint while standing.  You develop severe abdominal pain.  You feel sick to your stomach (nauseous) or throw up (vomit) and this lasts for more than 1 day. MAKE SURE YOU:   Understand these instructions.  Will watch your condition.  Will get help right away if you are not doing well or get worse. Document Released: 03/25/2005 Document Revised: 06/17/2011 Document Reviewed:  09/07/2010 ExitCare Patient Information 2013 [place], [place]. PATIENT INSTRUCTIONS POST-ANESTHESIA  IMMEDIATELY FOLLOWING SURGERY:  Do not drive or operate machinery for the first twenty four hours after surgery.  Do not make any important decisions for twenty four hours after surgery or while taking narcotic pain medications or sedatives.  If you develop intractable nausea and vomiting or a severe headache please notify your doctor immediately.  FOLLOW-UP:  Please make an appointment with your surgeon as instructed. You do  not need to follow up with anesthesia unless specifically instructed to do so.  WOUND CARE INSTRUCTIONS (if applicable):  Keep a dry clean dressing on the anesthesia/puncture wound site if there is drainage.  Once the wound has quit draining you may leave it open to air.  Generally you should leave the bandage intact for twenty four hours unless there is drainage.  If the epidural site drains for more than 36-48 hours please call the anesthesia department.  QUESTIONS?:  Please feel free to call your physician or the hospital operator if you have any questions, and they will be happy to assist you.

## 2012-02-28 ENCOUNTER — Encounter (HOSPITAL_COMMUNITY)
Admission: RE | Disposition: A | Discharge status: Home / Self Care | Payer: Self-pay | Source: Ambulatory Visit | Attending: General Surgery

## 2012-02-28 ENCOUNTER — Encounter (HOSPITAL_COMMUNITY): Payer: Self-pay | Admitting: Anesthesiology

## 2012-02-28 ENCOUNTER — Ambulatory Visit (HOSPITAL_COMMUNITY)
Admission: RE | Admit: 2012-02-28 | Discharge: 2012-02-28 | Disposition: A | Discharge status: Home / Self Care | Payer: Medicare Other | Source: Ambulatory Visit | Attending: General Surgery | Admitting: General Surgery

## 2012-02-28 ENCOUNTER — Ambulatory Visit (HOSPITAL_COMMUNITY): Payer: Medicare Other | Admitting: Anesthesiology

## 2012-02-28 ENCOUNTER — Encounter (HOSPITAL_COMMUNITY): Payer: Self-pay | Admitting: *Deleted

## 2012-02-28 DIAGNOSIS — K8010 Calculus of gallbladder with chronic cholecystitis without obstruction: Secondary | ICD-10-CM | POA: Insufficient documentation

## 2012-02-28 DIAGNOSIS — F17200 Nicotine dependence, unspecified, uncomplicated: Secondary | ICD-10-CM | POA: Insufficient documentation

## 2012-02-28 HISTORY — PX: CHOLECYSTECTOMY: SHX55

## 2012-02-28 SURGERY — LAPAROSCOPIC CHOLECYSTECTOMY
Anesthesia: General | Site: Abdomen | Wound class: Clean Contaminated

## 2012-02-28 MED ORDER — PROPOFOL 10 MG/ML IV EMUL
INTRAVENOUS | Status: AC
  Filled 2012-02-28: qty 20

## 2012-02-28 MED ORDER — ONDANSETRON HCL 4 MG/2ML IJ SOLN
4.0000 mg | Freq: Once | INTRAMUSCULAR | Status: AC
  Administered 2012-02-28: 4 mg via INTRAVENOUS

## 2012-02-28 MED ORDER — LIDOCAINE HCL (CARDIAC) 20 MG/ML IV SOLN
INTRAVENOUS | Status: DC | PRN
  Administered 2012-02-28: 50 mg via INTRAVENOUS

## 2012-02-28 MED ORDER — MIDAZOLAM HCL 2 MG/2ML IJ SOLN
1.0000 mg | INTRAMUSCULAR | Status: DC | PRN
  Administered 2012-02-28: 2 mg via INTRAVENOUS

## 2012-02-28 MED ORDER — GLYCOPYRROLATE 0.2 MG/ML IJ SOLN
INTRAMUSCULAR | Status: AC
  Filled 2012-02-28: qty 2

## 2012-02-28 MED ORDER — MIDAZOLAM HCL 2 MG/2ML IJ SOLN
INTRAMUSCULAR | Status: AC
  Filled 2012-02-28: qty 2

## 2012-02-28 MED ORDER — LACTATED RINGERS IV SOLN
INTRAVENOUS | Status: DC
  Administered 2012-02-28 (×2): via INTRAVENOUS

## 2012-02-28 MED ORDER — FENTANYL CITRATE 0.05 MG/ML IJ SOLN
25.0000 ug | INTRAMUSCULAR | Status: DC | PRN
  Administered 2012-02-28 (×2): 50 ug via INTRAVENOUS

## 2012-02-28 MED ORDER — EPHEDRINE SULFATE 50 MG/ML IJ SOLN
INTRAMUSCULAR | Status: AC
  Filled 2012-02-28: qty 1

## 2012-02-28 MED ORDER — ROCURONIUM BROMIDE 100 MG/10ML IV SOLN
INTRAVENOUS | Status: DC | PRN
  Administered 2012-02-28: 40 mg via INTRAVENOUS

## 2012-02-28 MED ORDER — ENOXAPARIN SODIUM 40 MG/0.4ML ~~LOC~~ SOLN
SUBCUTANEOUS | Status: AC
  Filled 2012-02-28: qty 0.4

## 2012-02-28 MED ORDER — GLYCOPYRROLATE 0.2 MG/ML IJ SOLN
INTRAMUSCULAR | Status: DC | PRN
  Administered 2012-02-28: 0.4 mg via INTRAVENOUS

## 2012-02-28 MED ORDER — PROPOFOL 10 MG/ML IV BOLUS
INTRAVENOUS | Status: DC | PRN
  Administered 2012-02-28: 200 mg via INTRAVENOUS

## 2012-02-28 MED ORDER — LIDOCAINE HCL (PF) 1 % IJ SOLN
INTRAMUSCULAR | Status: AC
  Filled 2012-02-28: qty 5

## 2012-02-28 MED ORDER — FENTANYL CITRATE 0.05 MG/ML IJ SOLN
INTRAMUSCULAR | Status: AC
  Filled 2012-02-28: qty 2

## 2012-02-28 MED ORDER — CEFAZOLIN SODIUM-DEXTROSE 2-3 GM-% IV SOLR
2.0000 g | INTRAVENOUS | Status: AC
  Administered 2012-02-28: 2 g via INTRAVENOUS

## 2012-02-28 MED ORDER — ONDANSETRON HCL 4 MG/2ML IJ SOLN
4.0000 mg | Freq: Once | INTRAMUSCULAR | Status: DC | PRN
  Filled 2012-02-28: qty 2

## 2012-02-28 MED ORDER — ENOXAPARIN SODIUM 40 MG/0.4ML ~~LOC~~ SOLN
40.0000 mg | Freq: Once | SUBCUTANEOUS | Status: AC
  Administered 2012-02-28: 40 mg via SUBCUTANEOUS

## 2012-02-28 MED ORDER — KETOROLAC TROMETHAMINE 30 MG/ML IJ SOLN: 30.0000 mg | Freq: Once | INTRAMUSCULAR | Status: DC

## 2012-02-28 MED ORDER — FENTANYL CITRATE 0.05 MG/ML IJ SOLN
INTRAMUSCULAR | Status: DC | PRN
  Administered 2012-02-28: 100 ug via INTRAVENOUS

## 2012-02-28 MED ORDER — OXYCODONE-ACETAMINOPHEN 7.5-325 MG PO TABS: 1.0000 | ORAL_TABLET | ORAL | Status: DC | PRN

## 2012-02-28 MED ORDER — SODIUM CHLORIDE 0.9 % IR SOLN
Status: DC | PRN
  Administered 2012-02-28: 1000 mL

## 2012-02-28 MED ORDER — BUPIVACAINE HCL (PF) 0.5 % IJ SOLN
INTRAMUSCULAR | Status: AC
  Filled 2012-02-28: qty 30

## 2012-02-28 MED ORDER — HEMOSTATIC AGENTS (NO CHARGE) OPTIME
TOPICAL | Status: DC | PRN
  Administered 2012-02-28: 1 via TOPICAL

## 2012-02-28 MED ORDER — BUPIVACAINE HCL (PF) 0.5 % IJ SOLN
INTRAMUSCULAR | Status: DC | PRN
  Administered 2012-02-28: 10 mL

## 2012-02-28 MED ORDER — FENTANYL CITRATE 0.05 MG/ML IJ SOLN
INTRAMUSCULAR | Status: AC
  Filled 2012-02-28: qty 5

## 2012-02-28 MED ORDER — ONDANSETRON HCL 4 MG/2ML IJ SOLN
INTRAMUSCULAR | Status: AC
  Filled 2012-02-28: qty 2

## 2012-02-28 MED ORDER — NEOSTIGMINE METHYLSULFATE 1 MG/ML IJ SOLN
INTRAMUSCULAR | Status: DC | PRN
  Administered 2012-02-28: 4 mg via INTRAVENOUS

## 2012-02-28 MED ORDER — NEOSTIGMINE METHYLSULFATE 1 MG/ML IJ SOLN
INTRAMUSCULAR | Status: AC
  Filled 2012-02-28: qty 10

## 2012-02-28 MED ORDER — CEFAZOLIN SODIUM-DEXTROSE 2-3 GM-% IV SOLR
INTRAVENOUS | Status: AC
  Filled 2012-02-28: qty 50

## 2012-02-28 SURGICAL SUPPLY — 32 items
APPLIER CLIP LAPSCP 10X32 DD (CLIP) ×2 IMPLANT
BAG HAMPER (MISCELLANEOUS) ×2 IMPLANT
BAG SPEC RTRVL LRG 6X4 10 (ENDOMECHANICALS) ×1
CLOTH BEACON ORANGE TIMEOUT ST (SAFETY) ×2 IMPLANT
COVER LIGHT HANDLE STERIS (MISCELLANEOUS) ×4 IMPLANT
DECANTER SPIKE VIAL GLASS SM (MISCELLANEOUS) ×2 IMPLANT
DURAPREP 26ML APPLICATOR (WOUND CARE) ×2 IMPLANT
ELECT REM PT RETURN 9FT ADLT (ELECTROSURGICAL) ×2
ELECTRODE REM PT RTRN 9FT ADLT (ELECTROSURGICAL) ×1 IMPLANT
FILTER SMOKE EVAC LAPAROSHD (FILTER) ×2 IMPLANT
FORMALIN 10 PREFIL 120ML (MISCELLANEOUS) ×2 IMPLANT
GLOVE BIO SURGEON STRL SZ7.5 (GLOVE) ×2 IMPLANT
GLOVE BIOGEL PI IND STRL 7.0 (GLOVE) IMPLANT
GLOVE BIOGEL PI INDICATOR 7.0 (GLOVE) ×3
GLOVE SS N UNI LF 6.5 STRL (GLOVE) ×2 IMPLANT
GOWN STRL REIN XL XLG (GOWN DISPOSABLE) ×7 IMPLANT
HEMOSTAT SNOW SURGICEL 2X4 (HEMOSTASIS) ×2 IMPLANT
INST SET LAPROSCOPIC AP (KITS) ×2 IMPLANT
KIT ROOM TURNOVER APOR (KITS) ×2 IMPLANT
KIT TROCAR LAP CHOLE (TROCAR) ×2 IMPLANT
MANIFOLD NEPTUNE II (INSTRUMENTS) ×2 IMPLANT
NS IRRIG 1000ML POUR BTL (IV SOLUTION) ×2 IMPLANT
PACK LAP CHOLE LZT030E (CUSTOM PROCEDURE TRAY) ×2 IMPLANT
PAD ARMBOARD 7.5X6 YLW CONV (MISCELLANEOUS) ×2 IMPLANT
POUCH SPECIMEN RETRIEVAL 10MM (ENDOMECHANICALS) ×2 IMPLANT
SET BASIN LINEN APH (SET/KITS/TRAYS/PACK) ×2 IMPLANT
SPONGE GAUZE 2X2 8PLY STRL LF (GAUZE/BANDAGES/DRESSINGS) ×8 IMPLANT
STAPLER VISISTAT (STAPLE) ×2 IMPLANT
SUT VICRYL 0 UR6 27IN ABS (SUTURE) ×2 IMPLANT
TAPE CLOTH SURG 4X10 WHT LF (GAUZE/BANDAGES/DRESSINGS) ×1 IMPLANT
WARMER LAPAROSCOPE (MISCELLANEOUS) ×2 IMPLANT
YANKAUER SUCT 12FT TUBE ARGYLE (SUCTIONS) ×2 IMPLANT

## 2012-02-28 NOTE — Interval H&P Note (Signed)
History and Physical Interval Note:  02/28/2012 8:17 AM  Isaiah Peterson  has presented today for surgery, with the diagnosis of Cholelithiasis  The various methods of treatment have been discussed with the patient and family. After consideration of risks, benefits and other options for treatment, the patient has consented to  Procedure(s) (LRB) with comments: LAPAROSCOPIC CHOLECYSTECTOMY (N/A) as a surgical intervention .  The patient's history has been reviewed, patient examined, no change in status, stable for surgery.  I have reviewed the patient's chart and labs.  Questions were answered to the patient's satisfaction.     Franky Macho A

## 2012-02-28 NOTE — Transfer of Care (Signed)
Immediate Anesthesia Transfer of Care Note  Patient: Isaiah Peterson  Procedure(s) Performed: Procedure(s) (LRB) with comments: LAPAROSCOPIC CHOLECYSTECTOMY (N/A)  Patient Location: PACU  Anesthesia Type:General  Level of Consciousness: awake and alert   Airway & Oxygen Therapy: Patient Spontanous Breathing and Patient connected to face mask oxygen  Post-op Assessment: Report given to PACU RN and Post -op Vital signs reviewed and stable  Post vital signs: Reviewed and stable  Complications: No apparent anesthesia complications

## 2012-02-28 NOTE — Op Note (Signed)
Patient:  Isaiah Peterson  DOB:  November 04, 1975  MRN:  [id number]   Preop Diagnosis:  Biliary colic, cholelithiasis  Postop Diagnosis:  Same  Procedure:  Laparoscopic cholecystectomy  Surgeon:  Franky Macho, M.D.  Anes:  General endotracheal  Indications:  Patient is a 36 year old white male with a history of both biliary colic and intermittent pancreatitis in the past who now presents for laparoscopic cholecystectomy. The risks and benefits of the procedure including bleeding, infection, hepatobiliary injury, and the possibility of an open procedure were fully explained to the patient, gave informed consent.  Procedure note:  The patient was placed in the supine position. After induction of general endotracheal anesthesia, the abdomen was prepped and draped using usual sterile technique with DuraPrep. Surgical site confirmation was performed.  An infraumbilical incision was made down to the fascia. A Veress needle was introduced into the abdominal cavity and confirmation of placement was done using the saline drop test. The abdomen was then insufflated to 16 mm mercury pressure. An 11 mm trocar was introduced into the abdominal cavity under direct visualization without difficulty. The patient was placed in reverse Trendelenburg position and additional 11 mm trocar was placed the epigastric region and 5 mm trochars were placed the right upper quadrant and right flank regions. The liver was inspected and noted to be within normal limits. The gallbladder was retracted in a dynamic fashion in order to expose the triangle of Calot. The cystic duct was first identified. Its junction to the infundibulum was fully identified. Endoclips were placed proximally and distally on the cystic duct and the cystic duct was divided. This is likewise done to the cystic artery. The gallbladder was then freed away from the gallbladder fossa using Bovie electrocautery. The gallbladder was delivered through the epigastric  trocar site using an Endo Catch bag. The gallbladder fossa was inspected no abnormal bleeding or bile leakage was noted. Surgicel is placed the gallbladder fossa. All fluid and air were then evacuated from the abdominal cavity prior to removal of the trochars.  All wounds were irrigated with normal saline. All wounds were injected with 0.5% Sensorcaine. The infraumbilical fascia as well as the epigastric fascia were reapproximated using 0 Vicryl interrupted sutures. All skin incisions were closed using staples. Betadine ointment and dry sterile dressings were applied.  All tape and needle counts were correct at the end of the procedure. The patient was extubated in the operating room and transferred to PACU in stable condition.  Complications:  None  EBL:  Minimal  Specimen:  Gallbladder

## 2012-02-28 NOTE — Anesthesia Procedure Notes (Signed)
Procedure Name: Intubation Date/Time: 02/28/2012 9:03 AM Performed by: Caren Macadam Pre-anesthesia Checklist: Patient identified, Emergency Drugs available, Suction available and Patient being monitored Patient Re-evaluated:Patient Re-evaluated prior to inductionOxygen Delivery Method: Circle System Utilized Preoxygenation: Pre-oxygenation with 100% oxygen Intubation Type: IV induction Ventilation: Mask ventilation without difficulty Laryngoscope Size: Miller and 2 Grade View: Grade I Tube type: Oral Number of attempts: 1 Airway Equipment and Method: stylet and oral airway Placement Confirmation: ETT inserted through vocal cords under direct vision,  positive ETCO2 and breath sounds checked- equal and bilateral Secured at: 22 cm Tube secured with: Tape Dental Injury: Teeth and Oropharynx as per pre-operative assessment

## 2012-02-28 NOTE — Anesthesia Postprocedure Evaluation (Signed)
  Anesthesia Post-op Note  Patient: Isaiah Peterson  Procedure(s) Performed: Procedure(s) (LRB) with comments: LAPAROSCOPIC CHOLECYSTECTOMY (N/A)  Patient Location: PACU  Anesthesia Type:General  Level of Consciousness: awake and alert   Airway and Oxygen Therapy: Patient Spontanous Breathing and Patient connected to face mask oxygen  Post-op Pain: none  Post-op Assessment: Post-op Vital signs reviewed, Patient's Cardiovascular Status Stable and Respiratory Function Stable  Post-op Vital Signs: Reviewed and stable  Complications: No apparent anesthesia complications

## 2012-02-28 NOTE — Anesthesia Preprocedure Evaluation (Signed)
Anesthesia Evaluation  Patient identified by MRN, date of birth, ID band Patient awake    Reviewed: Allergy & Precautions, H&P , NPO status , Patient's Chart, lab work & pertinent test results  History of Anesthesia Complications Negative for: history of anesthetic complications  Airway Mallampati: I TM Distance: >3 FB     Dental  (+) Edentulous Upper and Edentulous Lower   Pulmonary Current Smoker,  breath sounds clear to auscultation        Cardiovascular negative cardio ROS  Rhythm:Regular Rate:Normal     Neuro/Psych PSYCHIATRIC DISORDERS Schizophrenia    GI/Hepatic Gallstone pancreatitis    Endo/Other    Renal/GU      Musculoskeletal   Abdominal   Peds  Hematology   Anesthesia Other Findings   Reproductive/Obstetrics                           Anesthesia Physical Anesthesia Plan  ASA: II  Anesthesia Plan: General   Post-op Pain Management:    Induction: Intravenous  Airway Management Planned: Oral ETT  Additional Equipment:   Intra-op Plan:   Post-operative Plan: Extubation in OR  Informed Consent: I have reviewed the patients History and Physical, chart, labs and discussed the procedure including the risks, benefits and alternatives for the proposed anesthesia with the patient or authorized representative who has indicated his/her understanding and acceptance.     Plan Discussed with:   Anesthesia Plan Comments:         Anesthesia Quick Evaluation

## 2012-03-02 ENCOUNTER — Encounter (HOSPITAL_COMMUNITY): Payer: Self-pay | Admitting: General Surgery

## 2012-05-28 ENCOUNTER — Ambulatory Visit (INDEPENDENT_AMBULATORY_CARE_PROVIDER_SITE_OTHER): Payer: Medicare Other | Admitting: Gastroenterology

## 2012-05-28 ENCOUNTER — Other Ambulatory Visit: Payer: Self-pay | Admitting: Internal Medicine

## 2012-05-28 ENCOUNTER — Encounter: Payer: Self-pay | Admitting: Gastroenterology

## 2012-05-28 VITALS — BP 126/81 | HR 74 | Temp 97.4°F | Ht 71.0 in | Wt 138.6 lb

## 2012-05-28 DIAGNOSIS — R1013 Epigastric pain: Secondary | ICD-10-CM | POA: Insufficient documentation

## 2012-05-28 MED ORDER — OMEPRAZOLE 20 MG PO CPDR: 20.0000 mg | DELAYED_RELEASE_CAPSULE | Freq: Every day | ORAL | Status: DC

## 2012-05-28 NOTE — Patient Instructions (Addendum)
We have scheduled you for an upper endoscopy with Dr. Jena Gauss in the near future.  Please have blood work done. We will call with the results.  Finally, start taking Prilosec each morning, 30 minutes before breakfast.   STOP IBUPROFEN. This can hurt your stomach.

## 2012-05-28 NOTE — Progress Notes (Signed)
Primary Care Physician:  Default, Provider, MD Primary Gastroenterologist: Dr. Jena Gauss  Chief Complaint  Patient presents with  . Abdominal Pain    HPI:   37 year old male returns today with history of questionable biliary pancreatitis, s/p cholecystectomy late 2013 by Dr. Lovell Sheehan. His last EGD was in 2008 due to removal of chicken bone; this was incomplete. He returns today with continued abdominal pain. Symptom onset one month ago, at least daily, located in epigastric region then radiating to back. No correlation with eating/drinking. Mild nausea, rare. No vomiting. Appetite is so-so, which is normal for him. Pain lasts about 45 minutes to an hour. Has to ball up with knees to his chest to help. No fever/chills. No melena. No rectal bleeding. Up 4 lbs from last visit in Nov 2013. Denies any reflux, heartburn. Intermittent dysphagia for about a month, unable to report specific foods that cause problems. Takes Ibuprofen at least twice a day. For approximately one month.  Right after cholecystectomy, went to jail for approximately 2 months. Notes onset of symptoms while in jail. Released January 27th. Incarceration secondary to driving without a license.   Past Medical History  Diagnosis Date  . Bipolar 1 disorder   . Schizophrenia, schizo-affective   . Gallstones   . Pancreatitis     questionable mild biliary pancreatitis    Past Surgical History  Procedure Laterality Date  . Appendectomy    . Esophagogastroduodenoscopy  07/11/2006    RMR: Esophageal foreign body (chicken bone), status post removal, as described above.  Complete esophagogastroduodenoscopy not carried out  . Cholecystectomy  02/28/2012    Procedure: LAPAROSCOPIC CHOLECYSTECTOMY;  Surgeon: Dalia Heading, MD;  Location: [facility];  Service: General;  Laterality: N/A;  . Multiple tooth extractions      due to decay, nerve damage, edentulous    Current Outpatient Prescriptions  Medication Sig Dispense Refill  . ibuprofen  (ADVIL,MOTRIN) 200 MG tablet Take 200 mg by mouth every 6 (six) hours as needed for pain.      Marland Kitchen oxyCODONE-acetaminophen (PERCOCET) 7.5-325 MG per tablet Take 1-2 tablets by mouth every 4 (four) hours as needed for pain.  50 tablet  0   No current facility-administered medications for this visit.    Allergies as of 05/28/2012 - Review Complete 05/28/2012  Allergen Reaction Noted  . Haldol (haloperidol lactate) Other (See Comments) 02/19/2012    Family History  Problem Relation Age of Onset  . Aneurysm Mother     brain  . Colon cancer Maternal Uncle     great uncle  . Liver disease Mother   . Pancreatitis Maternal Uncle     died age 14  . Stomach cancer Maternal Aunt     great aunt    History   Social History  . Marital Status: Single    Spouse Name: N/A    Number of Children: N/A  . Years of Education: N/A   Social History Main Topics  . Smoking status: Current Every Day Smoker -- 0.50 packs/day for 20 years    Types: Cigarettes  . Smokeless tobacco: None  . Alcohol Use: No     Comment: former quit about 10 years ago  . Drug Use: Yes    Special: Marijuana     Comment: marijuana end of Jan 2014   . Sexually Active: Yes   Other Topics Concern  . None   Social History Narrative  . None    Review of Systems: Gen: SEE HPI CV: Denies chest pain,  palpitations, syncope, peripheral edema, and claudication. Resp: Denies dyspnea at rest, cough, wheezing, coughing up blood, and pleurisy. GI: SEE HPI Derm: Denies rash, itching, dry skin Psych: intermittent depression/anxiety "comes and goes"  Heme: Denies bruising, bleeding, and enlarged lymph nodes.  Physical Exam: BP 126/81  Pulse 74  Temp(Src) 97.4 F (36.3 C) (Oral)  Ht 5\' 11"  (1.803 m)  Wt 138 lb 9.6 oz (62.869 kg)  BMI 19.34 kg/m2 General:   Alert and oriented. No distress noted. Thin.  Head:  Normocephalic and atraumatic. Eyes:  Conjuctiva clear without scleral icterus. Mouth:  Edentulous.  Neck:   Supple, without mass or thyromegaly. Heart:  S1, S2 present without murmurs, rubs, or gallops. Regular rate and rhythm. Abdomen:  +BS, soft, tender to palpation epigastric region and non-distended. No rebound or guarding. No HSM or masses noted. Msk:  Symmetrical without gross deformities. Normal posture. Extremities:  Without edema. Neurologic:  Alert and  oriented x4;  grossly normal neurologically. Skin:  Intact without significant lesions or rashes. Cervical Nodes:  No significant cervical adenopathy. Psych:  Alert and cooperative. Normal mood and affect.

## 2012-05-28 NOTE — Assessment & Plan Note (Signed)
Dilation at time of EGD if necessary.

## 2012-05-28 NOTE — Progress Notes (Signed)
No PCP on file 

## 2012-05-28 NOTE — Assessment & Plan Note (Addendum)
37 year old male with history of possible biliary pancreatitis in the past; due to concerns for this, he underwent cholecystectomy in late 2013 by Dr. Lovell Sheehan. He notes recurrent epigastric pain, radiating to his back, for at least one month. Interestingly, he has been taking Ibuprofen daily. He is not on a PPI. In the past, we had discussed proceeding with an EGD if necessary. Doubt dealing with recurrent pancreatitis. To be thorough, will order lipase and LFTs today. Add PPI daily. STOP NSAIDs. Also notable, reports of intermittent solid food dysphagia. 2008 he underwent a therapeutic EGD by Dr. Jena Gauss to remove a chicken bone. I question an occult esophageal web, ring, or possible stricture.   Proceed with upper endoscopy and dilation if appropriate in the near future with Dr. Jena Gauss. The risks, benefits, and alternatives have been discussed in detail with patient. They have stated understanding and desire to proceed.  Phenergan 25 mg IV on call due to history of marijuana.

## 2012-06-01 ENCOUNTER — Encounter (HOSPITAL_COMMUNITY): Admission: RE | Payer: Self-pay | Source: Ambulatory Visit

## 2012-06-01 ENCOUNTER — Ambulatory Visit (HOSPITAL_COMMUNITY): Admission: RE | Admit: 2012-06-01 | Payer: Medicare Other | Source: Ambulatory Visit | Admitting: Internal Medicine

## 2012-06-01 SURGERY — ESOPHAGOGASTRODUODENOSCOPY (EGD) WITH ESOPHAGEAL DILATION
Anesthesia: Moderate Sedation

## 2012-06-01 NOTE — Progress Notes (Signed)
Called pt to see if he was coming for EGD today because he was already 10 minutes late, pt stated, "I can't come, I can't get a ride." Pt advised to call Dr. Luvenia Starch office to reschedule. Pt verbalized understanding. Biochemist, clinical notified.

## 2012-06-03 ENCOUNTER — Telehealth: Payer: Self-pay | Admitting: Gastroenterology

## 2012-06-03 NOTE — Telephone Encounter (Signed)
Patient No Showed for EGD/ED with RMR on Monday 02/24 and when Endo called him to see if he was coming his comment was he didn't have a ride. I have since called to try to get the patient R/S and I had to leave a message with family to have him to return our call

## 2012-12-17 ENCOUNTER — Emergency Department (HOSPITAL_COMMUNITY)
Admission: EM | Admit: 2012-12-17 | Discharge: 2012-12-17 | Disposition: A | Discharge status: Home / Self Care | Payer: Medicare Other | Attending: Emergency Medicine | Admitting: Emergency Medicine

## 2012-12-17 ENCOUNTER — Encounter (HOSPITAL_COMMUNITY): Payer: Self-pay | Admitting: Emergency Medicine

## 2012-12-17 DIAGNOSIS — F17200 Nicotine dependence, unspecified, uncomplicated: Secondary | ICD-10-CM | POA: Insufficient documentation

## 2012-12-17 DIAGNOSIS — F259 Schizoaffective disorder, unspecified: Secondary | ICD-10-CM | POA: Insufficient documentation

## 2012-12-17 DIAGNOSIS — R1013 Epigastric pain: Secondary | ICD-10-CM | POA: Insufficient documentation

## 2012-12-17 DIAGNOSIS — Z8719 Personal history of other diseases of the digestive system: Secondary | ICD-10-CM | POA: Insufficient documentation

## 2012-12-17 DIAGNOSIS — G8929 Other chronic pain: Secondary | ICD-10-CM | POA: Insufficient documentation

## 2012-12-17 DIAGNOSIS — F319 Bipolar disorder, unspecified: Secondary | ICD-10-CM | POA: Insufficient documentation

## 2012-12-17 DIAGNOSIS — Z79899 Other long term (current) drug therapy: Secondary | ICD-10-CM | POA: Insufficient documentation

## 2012-12-17 DIAGNOSIS — R634 Abnormal weight loss: Secondary | ICD-10-CM | POA: Insufficient documentation

## 2012-12-17 LAB — COMPREHENSIVE METABOLIC PANEL
BUN: 9 mg/dL (ref 6–23)
CO2: 26 mEq/L (ref 19–32)
Calcium: 9.2 mg/dL (ref 8.4–10.5)
Chloride: 103 mEq/L (ref 96–112)
Creatinine, Ser: 1.06 mg/dL (ref 0.50–1.35)
GFR calc Af Amer: >90 mL/min (ref >90)
GFR calc non Af Amer: 89 mL/min — ABNORMAL LOW (ref >90)
Glucose, Bld: 105 mg/dL — ABNORMAL HIGH (ref 70–99)
Total Bilirubin: 0.2 mg/dL — ABNORMAL LOW (ref 0.3–1.2)

## 2012-12-17 LAB — LIPASE, BLOOD: Lipase: 42 U/L (ref 11–59)

## 2012-12-17 LAB — CBC WITH DIFFERENTIAL/PLATELET
Eosinophils Relative: 3 % (ref 0–5)
HCT: 39.7 % (ref 39.0–52.0)
Hemoglobin: 14.5 g/dL (ref 13.0–17.0)
Lymphocytes Relative: 20 % (ref 12–46)
MCV: 89.2 fL (ref 78.0–100.0)
Monocytes Absolute: 0.8 10*3/uL (ref 0.1–1.0)
Monocytes Relative: 9 % (ref 3–12)
Neutro Abs: 5.7 10*3/uL (ref 1.7–7.7)
WBC: 8.4 10*3/uL (ref 4.0–10.5)

## 2012-12-17 LAB — URINALYSIS, ROUTINE W REFLEX MICROSCOPIC
Bilirubin Urine: NEGATIVE
Nitrite: NEGATIVE
Specific Gravity, Urine: 1.006 (ref 1.005–1.030)
Urobilinogen, UA: 1 mg/dL (ref 0.0–1.0)

## 2012-12-17 MED ORDER — OMEPRAZOLE 20 MG PO CPDR: 20.0000 mg | DELAYED_RELEASE_CAPSULE | Freq: Every day | ORAL | Status: DC

## 2012-12-17 MED ORDER — ONDANSETRON 4 MG PO TBDP
8.0000 mg | ORAL_TABLET | Freq: Once | ORAL | Status: AC
  Administered 2012-12-17: 8 mg via ORAL
  Filled 2012-12-17: qty 2

## 2012-12-17 MED ORDER — HYDROCODONE-ACETAMINOPHEN 5-325 MG PO TABS
1.0000 | ORAL_TABLET | Freq: Once | ORAL | Status: AC
  Administered 2012-12-17: 1 via ORAL
  Filled 2012-12-17: qty 1

## 2012-12-17 NOTE — ED Provider Notes (Signed)
CSN: [id number]     Arrival date & time 12/17/12  1637 History   First MD Initiated Contact with Patient 12/17/12 1752     Chief Complaint  Patient presents with  . Abdominal Pain    HPI Pt has been having trouble with abdominal for the past year.  Previously he had gallstones and had his gallbladder removed but the symptoms started.  Pt had been living in [facility] and they were considering an endocscopy.  Pt moved to [place] so he has not had it done. The pain is sharp in the upper abdomen.  It goes to his back.  He has lost weight.  No fever, vomiting or diarrhea. Eating increases the symptoms.  Nothing helps. Past Medical History  Diagnosis Date  . Bipolar 1 disorder   . Schizophrenia, schizo-affective   . Gallstones   . Pancreatitis     questionable mild biliary pancreatitis   Past Surgical History  Procedure Laterality Date  . Appendectomy    . Esophagogastroduodenoscopy  07/11/2006    RMR: Esophageal foreign body (chicken bone), status post removal, as described above.  Complete esophagogastroduodenoscopy not carried out  . Cholecystectomy  02/28/2012    Procedure: LAPAROSCOPIC CHOLECYSTECTOMY;  Surgeon: Dalia Heading, MD;  Location: [facility];  Service: General;  Laterality: N/A;  . Multiple tooth extractions      due to decay, nerve damage, edentulous   Family History  Problem Relation Age of Onset  . Aneurysm Mother     brain  . Colon cancer Maternal Uncle     great uncle  . Liver disease Mother   . Pancreatitis Maternal Uncle     died age 20  . Stomach cancer Maternal Aunt     great aunt   History  Substance Use Topics  . Smoking status: Current Every Day Smoker -- 0.50 packs/day for 20 years    Types: Cigarettes  . Smokeless tobacco: Not on file  . Alcohol Use: No     Comment: former quit about 10 years ago    Review of Systems  All other systems reviewed and are negative.    Allergies  Haldol  Home Medications   Current Outpatient Rx   Name  Route  Sig  Dispense  Refill  . ARIPiprazole (ABILIFY IM)   Intramuscular   Inject 1 vial into the muscle every 30 (thirty) days.         . Divalproex Sodium (DEPAKOTE PO)   Oral   Take 1 tablet by mouth at bedtime.         Marland Kitchen ibuprofen (ADVIL,MOTRIN) 200 MG tablet   Oral   Take 600-800 mg by mouth every 6 (six) hours as needed for pain.         Marland Kitchen PRESCRIPTION MEDICATION   Oral   Take 1 tablet by mouth 2 (two) times daily. Medication for anxiety.         Marland Kitchen QUEtiapine Fumarate (SEROQUEL PO)   Oral   Take 1 tablet by mouth every evening.         Marland Kitchen omeprazole (PRILOSEC) 20 MG capsule   Oral   Take 1 capsule (20 mg total) by mouth daily.   30 capsule   0    BP 143/93  Pulse 77  Temp(Src) 98.2 F (36.8 C) (Oral)  Resp 16  SpO2 99% Physical Exam  Nursing note and vitals reviewed. Constitutional: He appears well-developed and well-nourished. No distress.  HENT:  Head: Normocephalic and atraumatic.  Right  Ear: External ear normal.  Left Ear: External ear normal.  Eyes: Conjunctivae are normal. Right eye exhibits no discharge. Left eye exhibits no discharge. No scleral icterus.  Neck: Neck supple. No tracheal deviation present.  Cardiovascular: Normal rate, regular rhythm and intact distal pulses.   Pulmonary/Chest: Effort normal and breath sounds normal. No stridor. No respiratory distress. He has no wheezes. He has no rales.  Abdominal: Soft. Bowel sounds are normal. He exhibits no distension. There is tenderness in the epigastric area. There is no rigidity and no rebound. No hernia.  Musculoskeletal: He exhibits no edema and no tenderness.  Neurological: He is alert. He has normal strength. No sensory deficit. Cranial nerve deficit:  no gross defecits noted. He exhibits normal muscle tone. He displays no seizure activity. Coordination normal.  Skin: Skin is warm and dry. No rash noted.  Psychiatric: He has a normal mood and affect.    ED Course   Procedures (including critical care time) Labs Review Labs Reviewed  CBC WITH DIFFERENTIAL - Abnormal; Notable for the following:    MCHC 36.5 (*)    All other components within normal limits  COMPREHENSIVE METABOLIC PANEL - Abnormal; Notable for the following:    Glucose, Bld 105 (*)    Total Bilirubin 0.2 (*)    GFR calc non Af Amer 89 (*)    All other components within normal limits  LIPASE, BLOOD  URINALYSIS, ROUTINE W REFLEX MICROSCOPIC   Imaging Review No results found.  MDM   1. Abdominal pain, chronic, epigastric    The patient's abdominal pain is chronic. There is no evidence of acute abnormalities on his laboratory tests.  I will give him referral to gastroenterology. I will start him on antacids.  At this time there does not appear to be any evidence of an acute emergency medical condition and the patient appears stable for discharge with appropriate outpatient follow up.    Celene Kras, MD 12/17/12 [phone]

## 2012-12-17 NOTE — ED Notes (Signed)
Pt c/o upper abd pain x several days that feels similar to when had pancreatitis

## 2012-12-17 NOTE — ED Notes (Signed)
Pt walking home 

## 2013-01-05 ENCOUNTER — Emergency Department (HOSPITAL_COMMUNITY)
Admission: EM | Admit: 2013-01-05 | Discharge: 2013-01-05 | Disposition: A | Discharge status: Home / Self Care | Payer: Medicare Other | Attending: Emergency Medicine | Admitting: Emergency Medicine

## 2013-01-05 ENCOUNTER — Encounter (HOSPITAL_COMMUNITY): Payer: Self-pay | Admitting: Emergency Medicine

## 2013-01-05 DIAGNOSIS — F17200 Nicotine dependence, unspecified, uncomplicated: Secondary | ICD-10-CM | POA: Insufficient documentation

## 2013-01-05 DIAGNOSIS — F319 Bipolar disorder, unspecified: Secondary | ICD-10-CM | POA: Insufficient documentation

## 2013-01-05 DIAGNOSIS — IMO0001 Reserved for inherently not codable concepts without codable children: Secondary | ICD-10-CM | POA: Insufficient documentation

## 2013-01-05 DIAGNOSIS — F259 Schizoaffective disorder, unspecified: Secondary | ICD-10-CM | POA: Insufficient documentation

## 2013-01-05 DIAGNOSIS — Z79899 Other long term (current) drug therapy: Secondary | ICD-10-CM | POA: Insufficient documentation

## 2013-01-05 DIAGNOSIS — R1310 Dysphagia, unspecified: Secondary | ICD-10-CM | POA: Insufficient documentation

## 2013-01-05 DIAGNOSIS — Z8719 Personal history of other diseases of the digestive system: Secondary | ICD-10-CM | POA: Insufficient documentation

## 2013-01-05 MED ORDER — KETOROLAC TROMETHAMINE 30 MG/ML IJ SOLN
30.0000 mg | Freq: Once | INTRAMUSCULAR | Status: AC
  Administered 2013-01-05: 30 mg via INTRAMUSCULAR
  Filled 2013-01-05: qty 1

## 2013-01-05 MED ORDER — MAGIC MOUTHWASH: 5.0000 mL | Freq: Three times a day (TID) | ORAL | Status: DC

## 2013-01-05 MED ORDER — MAGIC MOUTHWASH
5.0000 mL | Freq: Once | ORAL | Status: AC
  Administered 2013-01-05: 5 mL via ORAL
  Filled 2013-01-05: qty 5

## 2013-01-05 NOTE — ED Provider Notes (Signed)
CSN: [id number]     Arrival date & time 01/05/13  2040 History   First MD Initiated Contact with Patient 01/05/13 2157     Chief Complaint  Patient presents with  . Sore Throat   (Consider location/radiation/quality/duration/timing/severity/associated sxs/prior Treatment) HPI Comments: Patient reports, that for the past several, days.  She's had sore throat, and difficulty swallowing, when he does swallow.  The pain radiates to his posterior neck and, shoulders.  He's not taken any medication for any symptom relief.  He denies any fever or rash.  Nausea, vomiting, and diarrhea  Patient is a 37 y.o. male presenting with pharyngitis. The history is provided by the patient.  Sore Throat This is a new problem. The current episode started in the past 7 days. The problem occurs intermittently. The problem has been unchanged. Associated symptoms include arthralgias and a sore throat. Pertinent negatives include no chills, coughing, fever, headaches or rash. The symptoms are aggravated by swallowing. He has tried nothing for the symptoms. The treatment provided no relief.    Past Medical History  Diagnosis Date  . Bipolar 1 disorder   . Schizophrenia, schizo-affective   . Gallstones   . Pancreatitis     questionable mild biliary pancreatitis   Past Surgical History  Procedure Laterality Date  . Appendectomy    . Esophagogastroduodenoscopy  07/11/2006    RMR: Esophageal foreign body (chicken bone), status post removal, as described above.  Complete esophagogastroduodenoscopy not carried out  . Cholecystectomy  02/28/2012    Procedure: LAPAROSCOPIC CHOLECYSTECTOMY;  Surgeon: Dalia Heading, MD;  Location: [facility];  Service: General;  Laterality: N/A;  . Multiple tooth extractions      due to decay, nerve damage, edentulous   Family History  Problem Relation Age of Onset  . Aneurysm Mother     brain  . Colon cancer Maternal Uncle     great uncle  . Liver disease Mother   . Pancreatitis  Maternal Uncle     died age 17  . Stomach cancer Maternal Aunt     great aunt   History  Substance Use Topics  . Smoking status: Current Every Day Smoker -- 0.50 packs/day for 20 years    Types: Cigarettes  . Smokeless tobacco: Not on file  . Alcohol Use: No     Comment: former quit about 10 years ago    Review of Systems  Constitutional: Negative for fever and chills.  HENT: Positive for sore throat and trouble swallowing.   Respiratory: Negative for cough.   Musculoskeletal: Positive for arthralgias.  Skin: Negative for rash and wound.  Neurological: Negative for headaches.  All other systems reviewed and are negative.    Allergies  Haldol  Home Medications   Current Outpatient Rx  Name  Route  Sig  Dispense  Refill  . ARIPiprazole (ABILIFY) 9.75 MG/1.3ML injection   Intramuscular   Inject 9.75 mg into the muscle every 30 (thirty) days.         . divalproex (DEPAKOTE ER) 500 MG 24 hr tablet   Oral   Take 1,000 mg by mouth daily.         Marland Kitchen ibuprofen (ADVIL,MOTRIN) 200 MG tablet   Oral   Take 600-800 mg by mouth every 6 (six) hours as needed for pain.         Marland Kitchen omeprazole (PRILOSEC) 20 MG capsule   Oral   Take 1 capsule (20 mg total) by mouth daily.   30 capsule  0   . propranolol (INDERAL) 20 MG tablet   Oral   Take 20 mg by mouth 2 (two) times daily.         . QUEtiapine (SEROQUEL) 100 MG tablet   Oral   Take 100 mg by mouth 2 (two) times daily.         . Alum & Mag Hydroxide-Simeth (MAGIC MOUTHWASH) SOLN   Oral   Take 5 mLs by mouth 3 (three) times daily.   120 mL   0    BP 133/83  Pulse 90  Temp(Src) 98.7 F (37.1 C) (Oral)  Resp 18  SpO2 98% Physical Exam  Vitals reviewed. Constitutional: He is oriented to person, place, and time. He appears well-developed and well-nourished.  HENT:  Head: Normocephalic.  Mouth/Throat: Uvula is midline. No edematous. Posterior oropharyngeal erythema present. No oropharyngeal exudate or  posterior oropharyngeal edema.  Eyes: Pupils are equal, round, and reactive to light.  Neck: Normal range of motion.  Cardiovascular: Normal rate and regular rhythm.   Pulmonary/Chest: Effort normal.  Abdominal: Soft. Bowel sounds are normal. He exhibits no distension. There is no tenderness.  Musculoskeletal: Normal range of motion.  Neurological: He is alert and oriented to person, place, and time.  Skin: Skin is warm and dry. No rash noted.    ED Course  Procedures (including critical care time) Labs Review Labs Reviewed  RAPID STREP SCREEN  CULTURE, GROUP A STREP   Imaging Review No results found.  MDM   1. Dysphagia     Patient's strep test is negative I treated the patient's pain with IM, and portable, and we will try Magic mouthwash for comfort with swallowing   Arman Filter, NP 01/05/13 2318

## 2013-01-05 NOTE — ED Notes (Signed)
Pt. reports sore throat for several days , " hard to swallow" , airway intact / respirations unlabored.

## 2013-01-05 NOTE — ED Provider Notes (Signed)
History/physical exam/procedure(s) were performed by non-physician practitioner and as supervising physician I was immediately available for consultation/collaboration. I have reviewed all notes and am in agreement with care and plan.   Hilario Quarry, MD 01/05/13 [phone]

## 2013-01-07 LAB — CULTURE, GROUP A STREP

## 2013-02-03 ENCOUNTER — Encounter (HOSPITAL_COMMUNITY): Payer: Self-pay | Admitting: Emergency Medicine

## 2013-02-03 ENCOUNTER — Emergency Department (HOSPITAL_COMMUNITY)
Admission: EM | Admit: 2013-02-03 | Discharge: 2013-02-04 | Disposition: A | Discharge status: Home / Self Care | Payer: Medicare Other | Attending: Emergency Medicine | Admitting: Emergency Medicine

## 2013-02-03 DIAGNOSIS — M79609 Pain in unspecified limb: Secondary | ICD-10-CM | POA: Insufficient documentation

## 2013-02-03 DIAGNOSIS — Z8719 Personal history of other diseases of the digestive system: Secondary | ICD-10-CM | POA: Insufficient documentation

## 2013-02-03 DIAGNOSIS — M5416 Radiculopathy, lumbar region: Secondary | ICD-10-CM

## 2013-02-03 DIAGNOSIS — F17200 Nicotine dependence, unspecified, uncomplicated: Secondary | ICD-10-CM | POA: Insufficient documentation

## 2013-02-03 DIAGNOSIS — R5381 Other malaise: Secondary | ICD-10-CM | POA: Insufficient documentation

## 2013-02-03 DIAGNOSIS — Z79899 Other long term (current) drug therapy: Secondary | ICD-10-CM | POA: Insufficient documentation

## 2013-02-03 DIAGNOSIS — R209 Unspecified disturbances of skin sensation: Secondary | ICD-10-CM | POA: Insufficient documentation

## 2013-02-03 DIAGNOSIS — IMO0002 Reserved for concepts with insufficient information to code with codable children: Secondary | ICD-10-CM | POA: Insufficient documentation

## 2013-02-03 DIAGNOSIS — F259 Schizoaffective disorder, unspecified: Secondary | ICD-10-CM | POA: Insufficient documentation

## 2013-02-03 DIAGNOSIS — F319 Bipolar disorder, unspecified: Secondary | ICD-10-CM | POA: Insufficient documentation

## 2013-02-03 NOTE — ED Notes (Signed)
The pt has had lower back pain for 3 weeks.  Back injury several years ago

## 2013-02-03 NOTE — ED Notes (Signed)
Chronic back pain x 3 weeks. Lower back that radiates down back of both legs. Does not have a PCP, and has been taking "800mg  to 1600mg  at least 11 of them a day.".

## 2013-02-04 ENCOUNTER — Emergency Department (HOSPITAL_COMMUNITY): Payer: Medicare Other

## 2013-02-04 MED ORDER — METHOCARBAMOL 500 MG PO TABS
500.0000 mg | ORAL_TABLET | Freq: Once | ORAL | Status: AC
  Administered 2013-02-04: 500 mg via ORAL
  Filled 2013-02-04: qty 1

## 2013-02-04 MED ORDER — PREDNISONE 20 MG PO TABS
60.0000 mg | ORAL_TABLET | ORAL | Status: AC
  Filled 2013-02-04: qty 3

## 2013-02-04 MED ORDER — PREDNISONE 20 MG PO TABS: ORAL_TABLET | ORAL | Status: DC

## 2013-02-04 MED ORDER — KETOROLAC TROMETHAMINE 30 MG/ML IJ SOLN
30.0000 mg | Freq: Once | INTRAMUSCULAR | Status: AC
  Administered 2013-02-04: 30 mg via INTRAMUSCULAR
  Filled 2013-02-04: qty 1

## 2013-02-04 MED ORDER — HYDROCODONE-ACETAMINOPHEN 5-325 MG PO TABS: 1.0000 | ORAL_TABLET | Freq: Four times a day (QID) | ORAL | Status: DC | PRN

## 2013-02-04 MED ORDER — METHOCARBAMOL 500 MG PO TABS: 500.0000 mg | ORAL_TABLET | Freq: Four times a day (QID) | ORAL | Status: DC

## 2013-02-04 MED ORDER — PREDNISONE 20 MG PO TABS
60.0000 mg | ORAL_TABLET | Freq: Once | ORAL | Status: AC
  Administered 2013-02-04: 60 mg via ORAL
  Filled 2013-02-04: qty 3

## 2013-02-04 MED ORDER — HYDROCODONE-ACETAMINOPHEN 5-325 MG PO TABS
1.0000 | ORAL_TABLET | Freq: Once | ORAL | Status: AC
  Administered 2013-02-04: 1 via ORAL
  Filled 2013-02-04: qty 1

## 2013-02-04 NOTE — ED Provider Notes (Signed)
Medical screening examination/treatment/procedure(s) were performed by non-physician practitioner and as supervising physician I was immediately available for consultation/collaboration.   Asharia Lotter, MD 02/04/13 0705 

## 2013-02-04 NOTE — ED Notes (Signed)
Patient transported to CT 

## 2013-02-04 NOTE — ED Notes (Signed)
The pt reports that the pain med did not help him

## 2013-02-04 NOTE — ED Notes (Signed)
To x-ray
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
## 2016-10-01 ENCOUNTER — Other Ambulatory Visit: Payer: Self-pay | Admitting: Internal Medicine

## 2016-10-01 DIAGNOSIS — R634 Abnormal weight loss: Secondary | ICD-10-CM

## 2016-10-01 DIAGNOSIS — R059 Cough, unspecified: Secondary | ICD-10-CM

## 2016-10-01 DIAGNOSIS — F17200 Nicotine dependence, unspecified, uncomplicated: Secondary | ICD-10-CM

## 2016-10-01 DIAGNOSIS — R05 Cough: Secondary | ICD-10-CM

## 2016-10-07 ENCOUNTER — Ambulatory Visit
Admission: RE | Admit: 2016-10-07 | Discharge: 2016-10-07 | Disposition: A | Discharge status: Home / Self Care | Payer: Medicare Other | Source: Ambulatory Visit | Attending: Internal Medicine | Admitting: Internal Medicine

## 2016-10-07 DIAGNOSIS — R05 Cough: Secondary | ICD-10-CM

## 2016-10-07 DIAGNOSIS — R634 Abnormal weight loss: Secondary | ICD-10-CM

## 2016-10-07 DIAGNOSIS — R059 Cough, unspecified: Secondary | ICD-10-CM

## 2016-10-07 DIAGNOSIS — F17200 Nicotine dependence, unspecified, uncomplicated: Secondary | ICD-10-CM | POA: Insufficient documentation

## 2017-06-06 ENCOUNTER — Emergency Department
Admission: EM | Admit: 2017-06-06 | Discharge: 2017-06-07 | Disposition: A | Discharge status: Other Acute Inpatient Hospital | Payer: Medicare Other | Attending: Emergency Medicine | Admitting: Emergency Medicine

## 2017-06-06 ENCOUNTER — Other Ambulatory Visit: Payer: Self-pay

## 2017-06-06 DIAGNOSIS — F209 Schizophrenia, unspecified: Secondary | ICD-10-CM | POA: Insufficient documentation

## 2017-06-06 DIAGNOSIS — Z79899 Other long term (current) drug therapy: Secondary | ICD-10-CM | POA: Insufficient documentation

## 2017-06-06 DIAGNOSIS — F17210 Nicotine dependence, cigarettes, uncomplicated: Secondary | ICD-10-CM | POA: Insufficient documentation

## 2017-06-06 DIAGNOSIS — F319 Bipolar disorder, unspecified: Secondary | ICD-10-CM | POA: Insufficient documentation

## 2017-06-06 LAB — URINE DRUG SCREEN, QUALITATIVE (ARMC ONLY)
Amphetamines, Ur Screen: NOT DETECTED
BARBITURATES, UR SCREEN: NOT DETECTED
Benzodiazepine, Ur Scrn: NOT DETECTED
Cannabinoid 50 Ng, Ur ~~LOC~~: POSITIVE — AB
Cocaine Metabolite,Ur ~~LOC~~: NOT DETECTED
MDMA (ECSTASY) UR SCREEN: NOT DETECTED
Methadone Scn, Ur: NOT DETECTED
Opiate, Ur Screen: NOT DETECTED
PHENCYCLIDINE (PCP) UR S: NOT DETECTED
Tricyclic, Ur Screen: POSITIVE — AB

## 2017-06-06 LAB — COMPREHENSIVE METABOLIC PANEL
ALT: 15 U/L — AB (ref 17–63)
AST: 23 U/L (ref 15–41)
Albumin: 4.7 g/dL (ref 3.5–5.0)
Alkaline Phosphatase: 44 U/L (ref 38–126)
Anion gap: 10 (ref 5–15)
BILIRUBIN TOTAL: 0.5 mg/dL (ref 0.3–1.2)
BUN: 10 mg/dL (ref 6–20)
CALCIUM: 9.5 mg/dL (ref 8.9–10.3)
CO2: 25 mmol/L (ref 22–32)
Chloride: 106 mmol/L (ref 101–111)
Creatinine, Ser: 1.05 mg/dL (ref 0.61–1.24)
Glucose, Bld: 103 mg/dL — ABNORMAL HIGH (ref 65–99)
Potassium: 4.4 mmol/L (ref 3.5–5.1)
Sodium: 141 mmol/L (ref 135–145)
TOTAL PROTEIN: 8.2 g/dL — AB (ref 6.5–8.1)

## 2017-06-06 LAB — SALICYLATE LEVEL

## 2017-06-06 LAB — CBC
HCT: 44.2 % (ref 40.0–52.0)
Hemoglobin: 15.5 g/dL (ref 13.0–18.0)
MCH: 32.6 pg (ref 26.0–34.0)
MCHC: 35 g/dL (ref 32.0–36.0)
MCV: 93.2 fL (ref 80.0–100.0)
PLATELETS: 137 10*3/uL — AB (ref 150–440)
RBC: 4.75 MIL/uL (ref 4.40–5.90)
RDW: 12.2 % (ref 11.5–14.5)
WBC: 12.2 10*3/uL — ABNORMAL HIGH (ref 3.8–10.6)

## 2017-06-06 LAB — ACETAMINOPHEN LEVEL: Acetaminophen (Tylenol), Serum: <10 ug/mL — ABNORMAL LOW (ref 10–30)

## 2017-06-06 LAB — ETHANOL

## 2017-06-06 MED ORDER — MIRTAZAPINE 15 MG PO TABS
7.5000 mg | ORAL_TABLET | Freq: Every day | ORAL | Status: DC
  Administered 2017-06-06: 7.5 mg via ORAL
  Filled 2017-06-06: qty 1

## 2017-06-06 MED ORDER — QUETIAPINE FUMARATE 200 MG PO TABS
400.0000 mg | ORAL_TABLET | Freq: Every day | ORAL | Status: DC
  Administered 2017-06-06: 400 mg via ORAL
  Filled 2017-06-06: qty 2

## 2017-06-06 MED ORDER — PANTOPRAZOLE SODIUM 40 MG PO TBEC
40.0000 mg | DELAYED_RELEASE_TABLET | Freq: Every day | ORAL | Status: DC
  Administered 2017-06-06 – 2017-06-07 (×2): 40 mg via ORAL
  Filled 2017-06-06: qty 1

## 2017-06-06 MED ORDER — PROPRANOLOL HCL 10 MG PO TABS
80.0000 mg | ORAL_TABLET | Freq: Every day | ORAL | Status: DC
  Administered 2017-06-06: 80 mg via ORAL
  Filled 2017-06-06: qty 8

## 2017-06-06 MED ORDER — DIVALPROEX SODIUM ER 500 MG PO TB24
1000.0000 mg | ORAL_TABLET | Freq: Every day | ORAL | Status: DC
  Administered 2017-06-06: 1000 mg via ORAL
  Filled 2017-06-06: qty 2

## 2017-06-06 MED ORDER — PANTOPRAZOLE SODIUM 40 MG PO TBEC
DELAYED_RELEASE_TABLET | ORAL | Status: AC
  Filled 2017-06-06: qty 1

## 2017-06-06 MED ORDER — PROPRANOLOL HCL 20 MG PO TABS
ORAL_TABLET | ORAL | Status: AC
  Filled 2017-06-06: qty 1

## 2017-06-06 NOTE — ED Notes (Signed)
ED BHU [place] Is the patient under IVC or is there intent for IVC: Yes.   Is the patient medically cleared: Yes.   Is there vacancy in the ED BHU: Yes.   Is the population mix appropriate for patient: Yes.   Is the patient awaiting placement in inpatient or outpatient setting: Yes.   Has the patient had a psychiatric consult: Yes.   Survey of unit performed for contraband, proper placement and condition of furniture, tampering with fixtures in bathroom, shower, and each patient room: Yes.   APPEARANCE/BEHAVIOR calm, cooperative and adequate rapport can be established NEURO ASSESSMENT Orientation: time, place and person Hallucinations: Yes.  Auditory Hallucinations and Visual Hallucinations Speech: Normal Gait: normal RESPIRATORY ASSESSMENT Normal expansion.  Clear to auscultation.  No rales, rhonchi, or wheezing. CARDIOVASCULAR ASSESSMENT regular rate and rhythm, S1, S2 normal, no murmur, click, rub or gallop GASTROINTESTINAL ASSESSMENT soft, nontender, BS WNL, no r/g EXTREMITIES normal strength, tone, and muscle mass PLAN OF CARE Provide calm/safe environment. Vital signs assessed twice daily. ED BHU Assessment once each 12-hour shift. Collaborate with intake RN daily or as condition indicates. Assure the ED provider has rounded once each shift. Provide and encourage hygiene. Provide redirection as needed. Assess for escalating behavior; address immediately and inform ED provider.  Assess family dynamic and appropriateness for visitation as needed: Yes.   Educate the patient/family about BHU procedures/visitation: Yes.

## 2017-06-06 NOTE — ED Notes (Signed)
Pt reports last Invega shot on 22 Feb, meds filled at [facility] in [place]
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
## 2017-06-06 NOTE — ED Triage Notes (Signed)
Pt arrives IVC with [facility] from [place]. Pt told MD at [facility] he is hearing voices. States has been hearing voices x 1 month, telling him to hurt self and others. Denies SI at moment. Denies HI at moment. States he is also seeing "dead people." Alert, oriented, ambulatory. Hx schizophrenia and bipolar. Takes medicine for both.

## 2017-06-06 NOTE — ED Provider Notes (Signed)
[facility] Emergency Department Provider Note  ____________________________________________  Time seen: Approximately 6:52 PM  I have reviewed the triage vital signs and the nursing notes.   HISTORY  Chief Complaint Hallucinations    HPI Isaiah Peterson. is a 42 y.o. male sent to the ED under involuntary commitment from [facility] due to auditory hallucinations telling him to kill himself and others. He feels like the voices are yelling at him and that he is unable to ignore them any longer. He reports a long-standing history of hallucinations for which she takes Depakote and [place]. He reports compliance with these medicines including last invega injection about a week ago.Denies any acute pain or other complaints. Denies any drug abuse. No trauma. Denies any overdose or self-harm today. He has thought about overdosing on medications recently due to the voices. Symptoms are constant, worsening, severe. No aggravating or alleviating factors     Past Medical History:  Diagnosis Date  . Bipolar 1 disorder ([place])   . Gallstones   . Pancreatitis    questionable mild biliary pancreatitis  . Schizophrenia, schizo-affective [facility])      Patient Active Problem List   Diagnosis Date Noted  . Dysphagia 05/28/2012  . Abdominal pain, chronic, epigastric 05/28/2012  . Gallstones 08/06/2011  . Pancreatitis 08/06/2011  . Cannabis abuse 06/10/2011  . Schizophrenia, paranoid, chronic ([place]) 06/08/2011    Class: Acute     Past Surgical History:  Procedure Laterality Date  . APPENDECTOMY    . CHOLECYSTECTOMY  02/28/2012   Procedure: LAPAROSCOPIC CHOLECYSTECTOMY;  Surgeon: Jamesetta So, MD;  Location: [facility];  Service: General;  Laterality: N/A;  . ESOPHAGOGASTRODUODENOSCOPY  07/11/2006   RMR: Esophageal foreign body (chicken bone), status post removal, as described above.  Complete esophagogastroduodenoscopy not carried out  . MULTIPLE TOOTH EXTRACTIONS      due to decay, nerve damage, edentulous     Prior to Admission medications   Medication Sig Start Date End Date Taking? Authorizing Provider  Alum & Mag Hydroxide-Simeth (MAGIC MOUTHWASH) SOLN Take 5 mLs by mouth 3 (three) times daily. 01/05/13   Junius Creamer, NP  divalproex (DEPAKOTE ER) 500 MG 24 hr tablet Take 1,500 mg by mouth daily.     [provider]  HYDROcodone-acetaminophen (NORCO/VICODIN) 5-325 MG per tablet Take 1 tablet by mouth every 6 (six) hours as needed for pain. 02/04/13   Junius Creamer, NP  ibuprofen (ADVIL,MOTRIN) 200 MG tablet Take 600-800 mg by mouth every 6 (six) hours as needed for pain.    [provider]  LORazepam (ATIVAN) 1 MG tablet Take 1 mg by mouth at bedtime.    [provider]  methocarbamol (ROBAXIN) 500 MG tablet Take 1 tablet (500 mg total) by mouth 4 (four) times daily. 02/04/13   Junius Creamer, NP  omeprazole (PRILOSEC) 20 MG capsule Take 1 capsule (20 mg total) by mouth daily. 12/17/12   Dorie Rank, MD  Paliperidone Palmitate (INVEGA SUSTENNA IM) Inject into the muscle. Not sure of dose. Pt gets this at his doctor office. Last injection was 01-19-13    [provider]  predniSONE (DELTASONE) 20 MG tablet 3 Tabs PO Days 1-3, then 2 tabs PO Days 4-6, then 1 tab PO Day 7-9, then Half Tab PO Day 10-12 02/04/13   Junius Creamer, NP  propranolol (INDERAL) 40 MG tablet Take 40 mg by mouth 2 (two) times daily.    [provider]  QUEtiapine (SEROQUEL) 100 MG tablet Take 100 mg  by mouth 2 (two) times daily.    [provider]     Allergies Haldol [haloperidol lactate]   Family History  Problem Relation Age of Onset  . Aneurysm Mother        brain  . Liver disease Mother   . Colon cancer Maternal Uncle        great uncle  . Pancreatitis Maternal Uncle        died age 18  . Stomach cancer Maternal Aunt        great aunt    Social History Social History   Tobacco Use  . Smoking status: Current Every  Day Smoker    Packs/day: 0.50    Years: 20.00    Pack years: 10.00    Types: Cigarettes  Substance Use Topics  . Alcohol use: No    Comment: former quit about 10 years ago  . Drug use: Yes    Types: Marijuana    Comment: marijuana end of Jan 2014     Review of Systems  Constitutional:   No fever or chills.  ENT:   No sore throat. Positive runny nose today  Cardiovascular:   No chest pain or syncope. Respiratory:   No dyspnea or cough. Gastrointestinal:   Negative for abdominal pain, vomiting and diarrhea.  Musculoskeletal:   Negative for focal pain or swelling All other systems reviewed and are negative except as documented above in ROS and HPI.  ____________________________________________   PHYSICAL EXAM:  VITAL SIGNS: ED Triage Vitals  Enc Vitals Group     BP 06/06/17 1735 (!) 145/95     Pulse Rate 06/06/17 1735 72     Resp 06/06/17 1735 16     Temp 06/06/17 1735 98.1 F (36.7 C)     Temp Source 06/06/17 1735 Oral     SpO2 06/06/17 1735 100 %     Weight 06/06/17 1736 150 lb (68 kg)     Height 06/06/17 1736 5\' 11"  (1.803 m)     Head Circumference --      Peak Flow --      Pain Score 06/06/17 1736 0     Pain Loc --      Pain Edu? --      Excl. in [place]? --     Vital signs reviewed, nursing assessments reviewed.   Constitutional:   Alert and oriented. Well appearing and in no distress. Eyes:   No scleral icterus.  EOMI. No nystagmus. No conjunctival pallor. PERRL. ENT   Head:   Normocephalic and atraumatic.   Nose:   No congestion/rhinnorhea.    Mouth/Throat:   MMM, no pharyngeal erythema. No peritonsillar mass.    Neck:   No meningismus. Full ROM. Hematological/Lymphatic/Immunilogical:   No cervical lymphadenopathy. Cardiovascular:   RRR. Symmetric bilateral radial and DP pulses.  No murmurs.  Respiratory:   Normal respiratory effort without tachypnea/retractions. Breath sounds are clear and equal bilaterally. No  wheezes/rales/rhonchi. Gastrointestinal:   Soft and nontender. Non distended. There is no CVA tenderness.  No rebound, rigidity, or guarding. Genitourinary:   deferred Musculoskeletal:   Normal range of motion in all extremities. No joint effusions.  No lower extremity tenderness.  No edema. Neurologic:   Normal speech and language.  Motor grossly intact. No acute focal neurologic deficits are appreciated.  Skin:    Skin is warm, dry and intact. No rash noted.  No petechiae, purpura, or bullae. No wounds  ____________________________________________    LABS (pertinent positives/negatives) (all labs  ordered are listed, but only abnormal results are displayed) Labs Reviewed  COMPREHENSIVE METABOLIC PANEL - Abnormal; Notable for the following components:      Result Value   Glucose, Bld 103 (*)    Total Protein 8.2 (*)    ALT 15 (*)    All other components within normal limits  ACETAMINOPHEN LEVEL - Abnormal; Notable for the following components:   Acetaminophen (Tylenol), Serum <10 (*)    All other components within normal limits  CBC - Abnormal; Notable for the following components:   WBC 12.2 (*)    Platelets 137 (*)    All other components within normal limits  URINE DRUG SCREEN, QUALITATIVE ([facility] ONLY) - Abnormal; Notable for the following components:   Tricyclic, Ur Screen POSITIVE (*)    Cannabinoid 50 Ng, Ur [place] POSITIVE (*)    All other components within normal limits  ETHANOL  SALICYLATE LEVEL   ____________________________________________   EKG    ____________________________________________    RADIOLOGY  No results found.  ____________________________________________   PROCEDURES Procedures  ____________________________________________    CLINICAL IMPRESSION / ASSESSMENT AND PLAN / ED COURSE  Pertinent labs & imaging results that were available during my care of the patient were reviewed by me and considered in my medical decision making (see  chart for details).  Awake alert and lucid, no acute medical complaints other than the acute worsening of his psychosis with hallucinations wearing him about the safety of himself and his family. He is medically stable at this time. Continue IVC pending psych dyspnea for psychosis.     ____________________________________________   FINAL CLINICAL IMPRESSION(S) / ED DIAGNOSES    Final diagnoses:  Schizophrenia, unspecified type ([place])       Portions of this note were generated with dragon dictation software. Dictation errors may occur despite best attempts at proofreading.    Carrie Mew, MD 06/06/17 [phone]

## 2017-06-06 NOTE — ED Notes (Signed)
IVC  Patient from [place]  With all papers completed

## 2017-06-06 NOTE — ED Notes (Signed)
Pt. Alert and oriented, warm and dry, in no distress. Pt. Denies HI. Patient states having SI without a plan and hearing voices to hurt himself. Patient also states seeing shadows. Patient contracts for safety with this Probation officer. Pt. Encouraged to let nursing staff know of any concerns or needs.

## 2017-06-07 ENCOUNTER — Encounter: Payer: Self-pay | Admitting: Behavioral Health

## 2017-06-07 ENCOUNTER — Inpatient Hospital Stay
Admission: AD | Admit: 2017-06-07 | Discharge: 2017-06-17 | DRG: 885 | Disposition: A | Discharge status: Home / Self Care | Payer: Medicare Other | Attending: Psychiatry | Admitting: Psychiatry

## 2017-06-07 ENCOUNTER — Other Ambulatory Visit: Payer: Self-pay

## 2017-06-07 DIAGNOSIS — F1210 Cannabis abuse, uncomplicated: Secondary | ICD-10-CM | POA: Diagnosis present

## 2017-06-07 DIAGNOSIS — K5900 Constipation, unspecified: Secondary | ICD-10-CM | POA: Diagnosis not present

## 2017-06-07 DIAGNOSIS — F1220 Cannabis dependence, uncomplicated: Secondary | ICD-10-CM | POA: Diagnosis present

## 2017-06-07 DIAGNOSIS — J189 Pneumonia, unspecified organism: Secondary | ICD-10-CM

## 2017-06-07 DIAGNOSIS — K219 Gastro-esophageal reflux disease without esophagitis: Secondary | ICD-10-CM | POA: Diagnosis present

## 2017-06-07 DIAGNOSIS — R45851 Suicidal ideations: Secondary | ICD-10-CM | POA: Diagnosis present

## 2017-06-07 DIAGNOSIS — R1310 Dysphagia, unspecified: Secondary | ICD-10-CM | POA: Diagnosis present

## 2017-06-07 DIAGNOSIS — Z91410 Personal history of adult physical and sexual abuse: Secondary | ICD-10-CM | POA: Diagnosis not present

## 2017-06-07 DIAGNOSIS — F17200 Nicotine dependence, unspecified, uncomplicated: Secondary | ICD-10-CM | POA: Diagnosis present

## 2017-06-07 DIAGNOSIS — F17210 Nicotine dependence, cigarettes, uncomplicated: Secondary | ICD-10-CM | POA: Diagnosis present

## 2017-06-07 DIAGNOSIS — Z888 Allergy status to other drugs, medicaments and biological substances status: Secondary | ICD-10-CM

## 2017-06-07 DIAGNOSIS — Z915 Personal history of self-harm: Secondary | ICD-10-CM | POA: Diagnosis not present

## 2017-06-07 DIAGNOSIS — F250 Schizoaffective disorder, bipolar type: Secondary | ICD-10-CM | POA: Diagnosis present

## 2017-06-07 DIAGNOSIS — Z716 Tobacco abuse counseling: Secondary | ICD-10-CM

## 2017-06-07 DIAGNOSIS — Z818 Family history of other mental and behavioral disorders: Secondary | ICD-10-CM

## 2017-06-07 DIAGNOSIS — Z9049 Acquired absence of other specified parts of digestive tract: Secondary | ICD-10-CM | POA: Diagnosis not present

## 2017-06-07 DIAGNOSIS — B349 Viral infection, unspecified: Secondary | ICD-10-CM | POA: Diagnosis not present

## 2017-06-07 DIAGNOSIS — F4310 Post-traumatic stress disorder, unspecified: Secondary | ICD-10-CM | POA: Diagnosis present

## 2017-06-07 DIAGNOSIS — G4700 Insomnia, unspecified: Secondary | ICD-10-CM | POA: Diagnosis present

## 2017-06-07 DIAGNOSIS — Z79899 Other long term (current) drug therapy: Secondary | ICD-10-CM

## 2017-06-07 MED ORDER — MAGNESIUM HYDROXIDE 400 MG/5ML PO SUSP
30.0000 mL | Freq: Every day | ORAL | Status: DC | PRN
Start: 2017-06-07 — End: 2017-06-17

## 2017-06-07 MED ORDER — PANTOPRAZOLE SODIUM 40 MG PO TBEC: 40.0000 mg | DELAYED_RELEASE_TABLET | Freq: Every day | ORAL | Status: DC

## 2017-06-07 MED ORDER — MIRTAZAPINE 15 MG PO TABS
7.5000 mg | ORAL_TABLET | Freq: Every day | ORAL | Status: DC
  Administered 2017-06-07 – 2017-06-12 (×6): 7.5 mg via ORAL
  Filled 2017-06-07 (×6): qty 1

## 2017-06-07 MED ORDER — MAGIC MOUTHWASH
5.0000 mL | Freq: Three times a day (TID) | ORAL | Status: DC
  Administered 2017-06-08 – 2017-06-17 (×28): 5 mL via ORAL
  Filled 2017-06-07: qty 10
  Filled 2017-06-07: qty 5
  Filled 2017-06-07 (×2): qty 10
  Filled 2017-06-07: qty 5
  Filled 2017-06-07 (×3): qty 10
  Filled 2017-06-07: qty 5
  Filled 2017-06-07: qty 10
  Filled 2017-06-07: qty 5
  Filled 2017-06-07: qty 10
  Filled 2017-06-07: qty 5
  Filled 2017-06-07: qty 10
  Filled 2017-06-07: qty 5
  Filled 2017-06-07 (×4): qty 10
  Filled 2017-06-07: qty 5
  Filled 2017-06-07 (×4): qty 10
  Filled 2017-06-07 (×2): qty 5
  Filled 2017-06-07: qty 10
  Filled 2017-06-07: qty 5
  Filled 2017-06-07 (×3): qty 10

## 2017-06-07 MED ORDER — ALUM & MAG HYDROXIDE-SIMETH 200-200-20 MG/5ML PO SUSP: 30.0000 mL | ORAL | Status: DC | PRN

## 2017-06-07 MED ORDER — PANTOPRAZOLE SODIUM 40 MG PO TBEC
40.0000 mg | DELAYED_RELEASE_TABLET | Freq: Every day | ORAL | Status: DC
  Administered 2017-06-08 – 2017-06-17 (×10): 40 mg via ORAL
  Filled 2017-06-07 (×10): qty 1

## 2017-06-07 MED ORDER — QUETIAPINE FUMARATE 200 MG PO TABS: 400.0000 mg | ORAL_TABLET | Freq: Every day | ORAL | Status: DC

## 2017-06-07 MED ORDER — MIRTAZAPINE 15 MG PO TABS: 7.5000 mg | ORAL_TABLET | Freq: Every day | ORAL | Status: DC

## 2017-06-07 MED ORDER — DIVALPROEX SODIUM ER 500 MG PO TB24: 1000.0000 mg | ORAL_TABLET | Freq: Every day | ORAL | Status: DC

## 2017-06-07 MED ORDER — ACETAMINOPHEN 325 MG PO TABS: 650.0000 mg | ORAL_TABLET | Freq: Four times a day (QID) | ORAL | Status: DC | PRN

## 2017-06-07 MED ORDER — NICOTINE 14 MG/24HR TD PT24
14.0000 mg | MEDICATED_PATCH | Freq: Every day | TRANSDERMAL | Status: DC
  Administered 2017-06-07 – 2017-06-17 (×11): 14 mg via TRANSDERMAL
  Filled 2017-06-07 (×11): qty 1

## 2017-06-07 MED ORDER — DIVALPROEX SODIUM ER 500 MG PO TB24
1000.0000 mg | ORAL_TABLET | Freq: Every day | ORAL | Status: DC
  Administered 2017-06-07 – 2017-06-16 (×10): 1000 mg via ORAL
  Filled 2017-06-07 (×11): qty 2

## 2017-06-07 MED ORDER — OLANZAPINE 10 MG PO TABS
10.0000 mg | ORAL_TABLET | Freq: Every day | ORAL | Status: DC
  Administered 2017-06-07: 10 mg via ORAL
  Filled 2017-06-07: qty 1
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
## 2017-06-07 NOTE — Progress Notes (Signed)
Patient ID: Isaiah Bumpers., male   DOB: 11-03-1975, 42 y.o.   MRN: [id number] Per State regulations 482.30 this chart was reviewed for medical necessity with respect to the patient's admission/duration of stay.    Next review date: 06/11/17  Debarah Crape, BSN, RN-BC  Case Manager
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
## 2017-06-07 NOTE — BH Assessment (Signed)
Patient's referral information faxed to:  [facility]    [address] [place] [address] Phone: [phone] Fax: [place], [place] [address] Phone: [phone] Fax: [facility]    [place] [address], [place] [address] Phone: [phone] Fax: [facility]    [address] [place] [address] Phone: [phone] Fax: [phone] [place] [address] [place] [address] Phone: [phone] Fax: [phone] [facility]   [address] [place] [address] Phone: [phone] Fax: [place] [address] [place] [address] Phone: [phone] Fax: [facility]    [address] [place], [place] [address] Phone: [phone] Fax: [phone] [facility]    [address], [place] [address] Phone: [phone] Fax: [phone] [facility]    [address] [place] [address] Phone: [phone] Fax: [facility]   [address], [place] [address] Phone: [phone] Fax: [phone] [facility]    [address] [place] [address] Phone: [phone] Fax: [facility]   [address] [place] [address] Phone: [phone] Fax: [phone]

## 2017-06-07 NOTE — ED Notes (Signed)
Patient resting quietly in room. No noted distress or abnormal behaviors noted. Will continue 15 minute checks and observation by security camera for safety. 

## 2017-06-07 NOTE — Tx Team (Signed)
Initial Treatment Plan 06/07/2017 5:56 PM [place]. TMB:[id number]    PATIENT STRESSORS: Marital or family conflict Other: auditory hallucinations   PATIENT STRENGTHS: Ability for insight Communication skills Motivation for treatment/growth   PATIENT IDENTIFIED PROBLEMS: Auditory hallucinations  Coping skills  Family conflict                 DISCHARGE CRITERIA:  Improved stabilization in mood, thinking, and/or behavior Safe-care adequate arrangements made Verbal commitment to aftercare and medication compliance  PRELIMINARY DISCHARGE PLAN: Outpatient therapy Return to previous living arrangement  PATIENT/FAMILY INVOLVEMENT: This treatment plan has been presented to and reviewed with the patient, Isaiah Peterson., and/or family member.  The patient and family have been given the opportunity to ask questions and make suggestions.  Geraldo Docker, RN 06/07/2017, 5:56 PM

## 2017-06-07 NOTE — ED Notes (Signed)
Pt discharged to BMU under IVC.  Pt accepting. All belongings sent with patient. Report called to Ok Edwards, Therapist, sports.

## 2017-06-07 NOTE — BH Assessment (Signed)
Patient is to be admitted to [facility] by Dr. Nicolasa Ducking Attending Physician will be Dr.Pucilowska Patient has been assigned to room 305A, by [facility] [place] [employer] Work has been signed and placed on patient chart.  ER staff is aware of the admission:  [facility] ER [facility]   Dr. Clearnce Hasten, ER MD   Amy Patient's Nurse
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
## 2017-06-07 NOTE — Plan of Care (Signed)
Pt has been seen pacing the hallway throughout the shift. Pt verbalize having auditory hallucinations to hurt himself and other individuals. Pt denies a plan at this time. Pt verbalize having visual hallucination of "seeing health relatives and other individuals". Pt denies si/hi/pain at this time. Pt is medication compliant. Remains on Q 15 mins safety rounds. Will cont to monitor pt.  Progressing Anxiety Demonstrates healthy coping skills 06/07/2017 2236 - Progressing by Corinna Capra, RN Activity: Interest or engagement in activities will improve 06/07/2017 2236 - Progressing by Corinna Capra, RN Sleeping patterns will improve 06/07/2017 2236 - Progressing by Corinna Capra, RN Education: Knowledge of [place] Education information/materials will improve 06/07/2017 2236 - Progressing by Corinna Capra, RN Mental status will improve 06/07/2017 2236 - Progressing by Corinna Capra, RN Coping: Ability to verbalize frustrations and anger appropriately will improve 06/07/2017 2236 - Progressing by Corinna Capra, RN Ability to demonstrate self-control will improve 06/07/2017 2236 - Progressing by Corinna Capra, RN Activity: Interest or engagement in leisure activities will improve 06/07/2017 2236 - Progressing by Corinna Capra, RN Education: Utilization of techniques to improve thought processes will improve 06/07/2017 2236 - Progressing by Corinna Capra, RN Education: Knowledge of [place] Education information will improve 06/07/2017 2236 - Progressing by Corinna Capra, RN Health Behavior/Discharge Planning: Ability to manage health-related needs will improve 06/07/2017 2236 - Progressing by Corinna Capra, RN Clinical Measurements: Ability to maintain clinical measurements within normal limits will improve 06/07/2017 2236 - Progressing by Corinna Capra, RN Will remain free from infection 06/07/2017 2236 - Progressing by Corinna Capra, RN Diagnostic test results will  improve 06/07/2017 2236 - Progressing by Corinna Capra, RN Respiratory complications will improve 06/07/2017 2236 - Progressing by Corinna Capra, RN Cardiovascular complication will be avoided 06/07/2017 2236 - Progressing by Renato Battles A, RN Activity: Risk for activity intolerance will decrease 06/07/2017 2236 - Progressing by Corinna Capra, RN Nutrition: Adequate nutrition will be maintained 06/07/2017 2236 - Progressing by Corinna Capra, RN Coping: Level of anxiety will decrease 06/07/2017 2236 - Progressing by Renato Battles A, RN Elimination: Will not experience complications related to bowel motility 06/07/2017 2236 - Progressing by Corinna Capra, RN Will not experience complications related to urinary retention 06/07/2017 2236 - Progressing by Corinna Capra, RN Pain Managment: General experience of comfort will improve 06/07/2017 2236 - Progressing by Corinna Capra, RN Safety: Ability to remain free from injury will improve 06/07/2017 2236 - Progressing by Corinna Capra, RN Skin Integrity: Risk for impaired skin integrity will decrease 06/07/2017 2236 - Progressing by Corinna Capra, RN

## 2017-06-07 NOTE — ED Provider Notes (Signed)
-----------------------------------------   6:53 AM on 06/07/2017 -----------------------------------------   Blood pressure 123/89, pulse 87, temperature 98.3 F (36.8 C), temperature source Oral, resp. rate 16, height 5\' 11"  (1.803 m), weight 68 kg (150 lb), SpO2 100 %.  The patient had no acute events since last update.  Calm and cooperative at this time.  Disposition is pending Psychiatry/Behavioral Medicine team recommendations.     Paulette Blanch, MD 06/07/17 [phone]

## 2017-06-08 ENCOUNTER — Encounter: Payer: Self-pay | Admitting: Psychiatry

## 2017-06-08 DIAGNOSIS — K219 Gastro-esophageal reflux disease without esophagitis: Secondary | ICD-10-CM | POA: Diagnosis present

## 2017-06-08 DIAGNOSIS — F17200 Nicotine dependence, unspecified, uncomplicated: Secondary | ICD-10-CM | POA: Diagnosis present

## 2017-06-08 DIAGNOSIS — F1210 Cannabis abuse, uncomplicated: Secondary | ICD-10-CM

## 2017-06-08 LAB — LIPID PANEL
Cholesterol: 156 mg/dL (ref 0–200)
HDL: 31 mg/dL — ABNORMAL LOW (ref >40)
LDL CALC: 75 mg/dL (ref 0–99)
TRIGLYCERIDES: 252 mg/dL — AB (ref <150)
Total CHOL/HDL Ratio: 5 RATIO
VLDL: 50 mg/dL — AB (ref 0–40)

## 2017-06-08 LAB — VALPROIC ACID LEVEL: Valproic Acid Lvl: 94 ug/mL (ref 50.0–100.0)

## 2017-06-08 LAB — HEMOGLOBIN A1C
Hgb A1c MFr Bld: 4.6 % — ABNORMAL LOW (ref 4.8–5.6)
Mean Plasma Glucose: 85.32 mg/dL

## 2017-06-08 LAB — TSH: TSH: 5.378 u[IU]/mL — ABNORMAL HIGH (ref 0.350–4.500)

## 2017-06-08 MED ORDER — OLANZAPINE 5 MG PO TABS
15.0000 mg | ORAL_TABLET | Freq: Every day | ORAL | Status: DC
  Administered 2017-06-08 – 2017-06-11 (×4): 15 mg via ORAL
  Filled 2017-06-08 (×4): qty 1

## 2017-06-08 MED ORDER — PROPRANOLOL HCL 20 MG PO TABS
10.0000 mg | ORAL_TABLET | Freq: Three times a day (TID) | ORAL | Status: DC
  Administered 2017-06-08 – 2017-06-16 (×25): 10 mg via ORAL
  Filled 2017-06-08 (×22): qty 1

## 2017-06-08 NOTE — Progress Notes (Signed)
[facility] MD Progress Note  06/08/2017 4:24 PM Emmett.  MRN:  [id number]  Subjective:    Mr. Pickup met with treatment team today. He complains bitterly of loud command hallucinations telling him to kill himself and other people. He has had voices since tha age of 42 but they have gotten worse in the past year. He feels that [place] "stopped working". There are no other complaints. Sleep and appetite are fair.   Treatment plan. We continue [place] sustenna monthly injections, Zyprexa 20 mg and Depakote for psychosis. We will start Clozapine 50 mg tonight to address persistent command hallucinations.   Social/disposition. He will return to his mother's place. Follow up with [facility].  Principal Problem: Schizoaffective disorder, bipolar type without good prognostic features ([place]) Diagnosis:   Patient Active Problem List   Diagnosis Date Noted  . Schizoaffective disorder, bipolar type without good prognostic features ([place]) [F25.0] 06/07/2017    Priority: High  . Tobacco use disorder [F17.200] 06/08/2017  . GERD (gastroesophageal reflux disease) [K21.9] 06/08/2017  . Cannabis abuse [F12.10]   . Dysphagia [R13.10] 05/28/2012  . Abdominal pain, chronic, epigastric [R10.13, G89.29] 05/28/2012  . Gallstones [K80.20] 08/06/2011  . Pancreatitis [K85.90] 08/06/2011  . Cannabis use disorder, moderate, dependence ([place]) [F12.20] 06/10/2011  . Schizophrenia, paranoid, chronic ([place]) [F20.0] 06/08/2011    Class: Acute   Total Time spent with patient: 30 minutes  Past Psychiatric History: schizophrenia  Past Medical History:  Past Medical History:  Diagnosis Date  . Bipolar 1 disorder ([place])   . Gallstones   . Pancreatitis    questionable mild biliary pancreatitis  . Schizophrenia, schizo-affective ([place])     Past Surgical History:  Procedure Laterality Date  . APPENDECTOMY    . CHOLECYSTECTOMY  02/28/2012   Procedure: LAPAROSCOPIC CHOLECYSTECTOMY;  Surgeon: Jamesetta So, MD;   Location: [facility];  Service: General;  Laterality: N/A;  . ESOPHAGOGASTRODUODENOSCOPY  07/11/2006   RMR: Esophageal foreign body (chicken bone), status post removal, as described above.  Complete esophagogastroduodenoscopy not carried out  . MULTIPLE TOOTH EXTRACTIONS     due to decay, nerve damage, edentulous   Family History:  Family History  Problem Relation Age of Onset  . Aneurysm Mother        brain  . Liver disease Mother   . Colon cancer Maternal Uncle        great uncle  . Pancreatitis Maternal Uncle        died age 86  . Stomach cancer Maternal Aunt        great aunt   Family Psychiatric  History: mother with bipolar Social History:  Social History   Substance and Sexual Activity  Alcohol Use No   Comment: former quit about 10 years ago     Social History   Substance and Sexual Activity  Drug Use Yes  . Types: Marijuana   Comment: marijuana end of Jan 2014     Social History   Socioeconomic History  . Marital status: Single    Spouse name: None  . Number of children: None  . Years of education: None  . Highest education level: None  Social Needs  . Financial resource strain: None  . Food insecurity - worry: None  . Food insecurity - inability: None  . Transportation needs - medical: None  . Transportation needs - non-medical: None  Occupational History  . None  Tobacco Use  . Smoking status: Current Every Day Smoker    Packs/day: 1.50  Years: 20.00    Pack years: 30.00    Types: Cigarettes  . Smokeless tobacco: Never Used  Substance and Sexual Activity  . Alcohol use: No    Comment: former quit about 10 years ago  . Drug use: Yes    Types: Marijuana    Comment: marijuana end of Jan 2014   . Sexual activity: Yes  Other Topics Concern  . None  Social History Narrative  . None   Additional Social History:                         Sleep: Fair  Appetite:  Fair  Current Medications: Current Facility-Administered Medications   Medication Dose Route Frequency Provider Last Rate Last Dose  . acetaminophen (TYLENOL) tablet 650 mg  650 mg Oral Q6H PRN Chauncey Mann, MD      . alum & mag hydroxide-simeth (MAALOX/MYLANTA) 200-200-20 MG/5ML suspension 30 mL  30 mL Oral Q4H PRN Chauncey Mann, MD      . divalproex (DEPAKOTE ER) 24 hr tablet 1,000 mg  1,000 mg Oral QHS Chauncey Mann, MD   1,000 mg at 06/07/17 2100  . magic mouthwash  5 mL Oral TID Chauncey Mann, MD   5 mL at 06/08/17 0814  . magnesium hydroxide (MILK OF MAGNESIA) suspension 30 mL  30 mL Oral Daily PRN Chauncey Mann, MD      . mirtazapine (REMERON) tablet 7.5 mg  7.5 mg Oral QHS Chauncey Mann, MD   7.5 mg at 06/07/17 2100  . nicotine (NICODERM CQ - dosed in mg/24 hours) patch 14 mg  14 mg Transdermal Daily Chauncey Mann, MD   14 mg at 06/08/17 0842  . OLANZapine (ZYPREXA) tablet 15 mg  15 mg Oral QHS Chauncey Mann, MD      . pantoprazole (PROTONIX) EC tablet 40 mg  40 mg Oral Daily Chauncey Mann, MD   40 mg at 06/08/17 0839  . propranolol (INDERAL) tablet 10 mg  10 mg Oral TID Chauncey Mann, MD   10 mg at 06/08/17 4818    Lab Results:  Results for orders placed or performed during the hospital encounter of 06/07/17 (from the past 48 hour(s))  Hemoglobin A1c     Status: Abnormal   Collection Time: 06/08/17  7:13 AM  Result Value Ref Range   Hgb A1c MFr Bld 4.6 (L) 4.8 - 5.6 %    Comment: (NOTE) Pre diabetes:          5.7%-6.4% Diabetes:              >6.4% Glycemic control for   <7.0% adults with diabetes    Mean Plasma Glucose 85.32 mg/dL    Comment: Performed at [facility] [address] [place], [place] [address]  Lipid panel     Status: Abnormal   Collection Time: 06/08/17  7:13 AM  Result Value Ref Range   Cholesterol 156 0 - 200 mg/dL   Triglycerides 252 (H) <150 mg/dL   HDL 31 (L) >40 mg/dL   Total CHOL/HDL Ratio 5.0 RATIO   VLDL 50 (H) 0 - 40 mg/dL   LDL Cholesterol 75 0 - 99 mg/dL    Comment:        Total  Cholesterol/HDL:CHD Risk Coronary Heart Disease Risk Table                     Men  Women  1/2 Average Risk   3.4   3.3  Average Risk       5.0   4.4  2 X Average Risk   9.6   7.1  3 X Average Risk  23.4   11.0        Use the calculated Patient Ratio above and the CHD Risk Table to determine the patient's CHD Risk.        ATP III CLASSIFICATION (LDL):  <100     mg/dL   Optimal  100-129  mg/dL   Near or Above                    Optimal  130-159  mg/dL   Borderline  160-189  mg/dL   High  >190     mg/dL   Very High Performed at [facility], [address] [place], [place] [address]   TSH     Status: Abnormal   Collection Time: 06/08/17  7:13 AM  Result Value Ref Range   TSH 5.378 (H) 0.350 - 4.500 uIU/mL    Comment: Performed by a 3rd Generation assay with a functional sensitivity of <=0.01 uIU/mL. Performed at [facility], [address] [place], [place] [address]   Valproic acid level     Status: None   Collection Time: 06/08/17  7:13 AM  Result Value Ref Range   Valproic Acid Lvl 94 50.0 - 100.0 ug/mL    Comment: Performed at [facility], [address] [place], [place] [address]    Blood Alcohol level:  Lab Results  Component Value Date   [facility] <10 06/06/2017   ETH <11 91/47/8295    Metabolic Disorder Labs: Lab Results  Component Value Date   HGBA1C 4.6 (L) 06/08/2017   MPG 85.32 06/08/2017   MPG 108 06/11/2011   No results found for: PROLACTIN Lab Results  Component Value Date   CHOL 156 06/08/2017   TRIG 252 (H) 06/08/2017   HDL 31 (L) 06/08/2017   CHOLHDL 5.0 06/08/2017   VLDL 50 (H) 06/08/2017   LDLCALC 75 06/08/2017   LDLCALC 63 06/11/2011    Physical Findings: AIMS:  , ,  ,  ,    CIWA:    COWS:     Musculoskeletal: Strength & Muscle Tone: within normal limits Gait & Station: normal Patient leans: N/A  Psychiatric Specialty Exam: Physical Exam  Nursing note and vitals reviewed. Psychiatric: His speech  is normal. His affect is blunt. He is withdrawn and actively hallucinating. Cognition and memory are normal. He expresses impulsivity. He expresses homicidal and suicidal ideation.    Review of Systems  Neurological: Negative.   Psychiatric/Behavioral: Positive for hallucinations and suicidal ideas.  All other systems reviewed and are negative.   Blood pressure 130/84, pulse 82, temperature 98 F (36.7 C), temperature source Oral, resp. rate 16, height 5' 11"  (1.803 m), weight 68.2 kg (150 lb 4 oz), SpO2 100 %.Body mass index is 20.96 kg/m.  General Appearance: Casual  Eye Contact:  Good  Speech:  Clear and Coherent  Volume:  Normal  Mood:  Anxious  Affect:  Blunt  Thought Process:  Goal Directed and Descriptions of Associations: Intact  Orientation:  Full (Time, Place, and Person)  Thought Content:  Hallucinations: Auditory Command:  to kill himself and others and Paranoid Ideation  Suicidal Thoughts:  Yes.  with intent/plan  Homicidal Thoughts:  Yes.  without intent/plan  Memory:  Immediate;   Fair Recent;  Fair Remote;   Fair  Judgement:  Impaired  Insight:  Shallow  Psychomotor Activity:  Psychomotor Retardation  Concentration:  Concentration: Fair and Attention Span: Fair  Recall:  [employer] of Knowledge:  Fair  Language:  Fair  Akathisia:  Yes  Handed:  Right  AIMS (if indicated):     Assets:  Communication Skills Desire for Improvement Financial Resources/Insurance Housing Physical Health Resilience Social Support  ADL's:  Intact  Cognition:  WNL  Sleep:  Number of Hours: 8.15     Treatment Plan Summary: Daily contact with patient to assess and evaluate symptoms and progress in treatment and Medication management   Mr. Kilner is a 42 year old male with a diagnosis of schizoaffective disorder who came to the emergency room under IVC endorsing suicidal and homicidal thoughts in the context of active psychotic symptoms. He attempted to buy a gun illegally  prior to admission.   #Schizoaffective disorder bipolar type -discontinue Seroquel  -continue Zyprexa 10 mg -continue Depakote ER 1000 mg nightly  -continue [place] monthly injections, last injection given 05/30/17 -continue Propanolol 24m po TID to help with akathesia -start Clozapine 50 mg nightly  #Cannabis use disorder -patient was advised to abstain from marijuana and all illicit drugs as they may worsen mood symptoms. -at this time, he is not interested in substance abuse treatment  #Nicotine use disorder -nicotine patch is available - he was advised to stop smoking completely as it may worsen his health  #Metabolic syndrome monitoring -lipid panel shows elevated TG, TSH is 5.3,  Normal HgbA1C -EKG, QTc 431  #Disposition -discharge to home with the mother -follow up with Dr. BIsabelle Course      JOrson Slick MD 06/08/2017, 4:24 PM
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
## 2017-06-08 NOTE — Plan of Care (Signed)
Patient is alert and oriented X 4. Patient denies SI, but states he is still hearing voices, and seeing deceased family members. Patient is very anxious and can be seen pacing in the hallways. Patient is pleasant and compliant with medications. Patient attends group but does not interact with peers. Patient will respond to others if questions are asked. Patient states he slept well and  verbally contracts for safety while present on unit. Patient's vitals today BP 130/84; 82 pulse;16 respirations. Nurse will continue to monitor patient throughout the day. Activity: Interest or engagement in activities will improve 06/08/2017 1313 - Progressing by Geraldo Docker, RN Sleeping patterns will improve 06/08/2017 1313 - Progressing by Geraldo Docker, RN   Education: Knowledge of [place] Education information/materials will improve 06/08/2017 1313 - Progressing by Geraldo Docker, RN Mental status will improve 06/08/2017 1313 - Progressing by Geraldo Docker, RN   Coping: Ability to verbalize frustrations and anger appropriately will improve 06/08/2017 1313 - Progressing by Geraldo Docker, RN Ability to demonstrate self-control will improve 06/08/2017 1313 - Progressing by Geraldo Docker, RN

## 2017-06-08 NOTE — [facility] Suicide Risk Assessment (Signed)
[place] INPATIENT:  Family/Significant Other Suicide Prevention Education  Suicide Prevention Education:  Patient Refusal for Family/Significant Other Suicide Prevention Education: The patient Isaiah Peterson. has refused to provide written consent for family/significant other to be provided Family/Significant Other Suicide Prevention Education during admission and/or prior to discharge.  Physician notified.  Mazi Schuff  CUEBAS-COLON 06/08/2017, 12:40 PM

## 2017-06-08 NOTE — [facility] Group Notes (Signed)
LCSW Group Therapy Note 06/08/2017 1:15pm  Type of Therapy and Topic: Group Therapy: Feelings Around Returning Home & Establishing a Supportive Framework and Supporting Oneself When Supports Not Available  Participation Level: Active  Description of Group:  Patients first processed thoughts and feelings about upcoming discharge. These included fears of upcoming changes, lack of change, new living environments, judgements and expectations from others and overall stigma of mental health issues. The group then discussed the definition of a supportive framework, what that looks and feels like, and how do to discern it from an unhealthy non-supportive network. The group identified different types of supports as well as what to do when your family/friends are less than helpful or unavailable  Therapeutic Goals  1. Patient will identify one healthy supportive network that they can use at discharge. 2. Patient will identify one factor of a supportive framework and how to tell it from an unhealthy network. 3. Patient able to identify one coping skill to use when they do not have positive supports from others. 4. Patient will demonstrate ability to communicate their needs through discussion and/or role plays.  Summary of Patient Progress:  Pt scored his mood 5 (10 best). Pt engaged during group session. As patients processed their anxiety about discharge and described healthy supports patient shared that he is hearing voices and seeing shadows. He stated he wants to feel better before he can say that he is ready to leave the hospital.  Patients identified at least one self-care tool they were willing to use after discharge.   Therapeutic Modalities Cognitive Behavioral Therapy Motivational Interviewing   Lutie Pickler  CUEBAS-COLON, LCSW 06/08/2017 10:29 AM
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
## 2017-06-08 NOTE — Plan of Care (Signed)
  Progressing Anxiety Demonstrates healthy coping skills 06/08/2017 2357 - Progressing by Corinna Capra, RN Activity: Interest or engagement in activities will improve 06/08/2017 2357 - Progressing by Corinna Capra, RN Sleeping patterns will improve 06/08/2017 2357 - Progressing by Corinna Capra, RN Education: Knowledge of [place] Education information/materials will improve 06/08/2017 2357 - Progressing by Corinna Capra, RN Mental status will improve 06/08/2017 2357 - Progressing by Corinna Capra, RN Coping: Ability to verbalize frustrations and anger appropriately will improve 06/08/2017 2357 - Progressing by Corinna Capra, RN Ability to demonstrate self-control will improve 06/08/2017 2357 - Progressing by Corinna Capra, RN Activity: Interest or engagement in leisure activities will improve 06/08/2017 2357 - Progressing by Corinna Capra, RN Education: Utilization of techniques to improve thought processes will improve 06/08/2017 2357 - Progressing by Corinna Capra, RN Education: Knowledge of General Education information will improve 06/08/2017 2357 - Progressing by Corinna Capra, RN Health Behavior/Discharge Planning: Ability to manage health-related needs will improve 06/08/2017 2357 - Progressing by Corinna Capra, RN Clinical Measurements: Ability to maintain clinical measurements within normal limits will improve 06/08/2017 2357 - Progressing by Corinna Capra, RN Will remain free from infection 06/08/2017 2357 - Progressing by Corinna Capra, RN Diagnostic test results will improve 06/08/2017 2357 - Progressing by Corinna Capra, RN Respiratory complications will improve 06/08/2017 2357 - Progressing by Corinna Capra, RN Cardiovascular complication will be avoided 06/08/2017 2357 - Progressing by Corinna Capra, RN Activity: Risk for activity intolerance will decrease 06/08/2017 2357 - Progressing by Corinna Capra, RN Nutrition: Adequate nutrition will be  maintained 06/08/2017 2357 - Progressing by Corinna Capra, RN Coping: Level of anxiety will decrease 06/08/2017 2357 - Progressing by Corinna Capra, RN Elimination: Will not experience complications related to bowel motility 06/08/2017 2357 - Progressing by Corinna Capra, RN Will not experience complications related to urinary retention 06/08/2017 2357 - Progressing by Corinna Capra, RN Pain Managment: General experience of comfort will improve 06/08/2017 2357 - Progressing by Corinna Capra, RN Safety: Ability to remain free from injury will improve 06/08/2017 2357 - Progressing by Corinna Capra, RN Skin Integrity: Risk for impaired skin integrity will decrease 06/08/2017 2357 - Progressing by Corinna Capra, RN

## 2017-06-09 LAB — CBC WITH DIFFERENTIAL/PLATELET
BASOS ABS: 0.1 10*3/uL (ref 0–0.1)
BASOS PCT: 1 %
EOS ABS: 0.2 10*3/uL (ref 0–0.7)
Eosinophils Relative: 2 %
HEMATOCRIT: 38.9 % — AB (ref 40.0–52.0)
HEMOGLOBIN: 13.6 g/dL (ref 13.0–18.0)
Lymphocytes Relative: 20 %
Lymphs Abs: 1.8 10*3/uL (ref 1.0–3.6)
MCH: 32.5 pg (ref 26.0–34.0)
MCHC: 34.9 g/dL (ref 32.0–36.0)
MCV: 93.2 fL (ref 80.0–100.0)
Monocytes Absolute: 1.2 10*3/uL — ABNORMAL HIGH (ref 0.2–1.0)
Monocytes Relative: 14 %
NEUTROS ABS: 5.5 10*3/uL (ref 1.4–6.5)
NEUTROS PCT: 63 %
Platelets: 127 10*3/uL — ABNORMAL LOW (ref 150–440)
RBC: 4.17 MIL/uL — ABNORMAL LOW (ref 4.40–5.90)
RDW: 12.1 % (ref 11.5–14.5)
WBC: 8.8 10*3/uL (ref 3.8–10.6)

## 2017-06-09 LAB — PROLACTIN: PROLACTIN: 48.5 ng/mL — AB (ref 4.0–15.2)

## 2017-06-09 LAB — VALPROIC ACID LEVEL: VALPROIC ACID LVL: 83 ug/mL (ref 50.0–100.0)

## 2017-06-09 MED ORDER — CLOZAPINE 25 MG PO TABS
50.0000 mg | ORAL_TABLET | Freq: Every day | ORAL | Status: DC
  Administered 2017-06-09 – 2017-06-10 (×2): 50 mg via ORAL
  Filled 2017-06-09 (×2): qty 2

## 2017-06-09 NOTE — Progress Notes (Signed)
Pt cont's to expressed having suicidal and homicidal ideation without a plan. Pt continues to express visual hallucination of seeing dead people. Pt is calm and pleasant upon approach. Pt is medication complaint. Pt remains on Q 15 min's safety rounds. Will cont to monitor pt.

## 2017-06-09 NOTE — Consult Note (Signed)
MEDICATION RELATED CONSULT NOTE - INITIAL   Pharmacy Consult for Clozapine Lab Monitoring and REMs reporting   Indication: Schizophrenia   Allergies  Allergen Reactions  . Haldol [Haloperidol Lactate] Other (See Comments)    Tardive dyskinesia.   Patient Measurements: Height: 5\' 11"  (180.3 cm) Weight: 150 lb 4 oz (68.2 kg) IBW/kg (Calculated) : 75.3  Vital Signs: Temp: 98.2 F (36.8 C) (03/04 0612) Temp Source: Oral (03/04 0612) BP: 127/78 (03/04 0612) Pulse Rate: 78 (03/04 0612)  Labs: Recent Labs    06/06/17 1738 06/09/17 1355  WBC 12.2* 8.8  HGB 15.5 13.6  HCT 44.2 38.9*  PLT 137* 127*  CREATININE 1.05  --   ALBUMIN 4.7  --   PROT 8.2*  --   AST 23  --   ALT 15*  --   ALKPHOS 44  --   BILITOT 0.5  --    Estimated Creatinine Clearance: 90.2 mL/min (by C-G formula based on SCr of 1.05 mg/dL).  Medical History: Past Medical History:  Diagnosis Date  . Bipolar 1 disorder ([place])   . Gallstones   . Pancreatitis    questionable mild biliary pancreatitis  . Schizophrenia, schizo-affective [facility])     Assessment: Pharmacy consulted for lab monitoring and Clozapine REMs program reporting in 42yo male being initiated on clozapine for Schizophrenia.   Plan:  [place] today [id number]. Lab was reported to online REMs program. Patient is eligible to receive clozapine with weekly lab monitoring and reporting.  Check ANC at least weekly while inpatient. Next labs ordered for 06/16/17.  Pernell Dupre, PharmD, BCPS Clinical Pharmacist 06/09/2017 2:33 PM

## 2017-06-09 NOTE — Progress Notes (Signed)
[facility] MD Progress Note  06/09/2017 7:23 PM [place].  MRN:  [id number]  Subjective:    Isaiah Peterson met with treatment team today. He complains bitterly of loud command hallucinations telling him to kill himself and other people. He has had voices since tha age of 42 but they have gotten worse in the past year. He feels that Isaiah Peterson "stopped working". There are no other complaints. Sleep and appetite are fair.   Treatment plan. We continue Invega sustenna monthly injections, Zyprexa 20 mg and Depakote for psychosis. We will start Clozapine 50 mg tonight to address persistent command hallucinations.   Social/disposition. He will return to his mother's place. Follow up with [facility].  Principal Problem: Schizoaffective disorder, bipolar type without good prognostic features ([place]) Diagnosis:   Patient Active Problem List   Diagnosis Date Noted  . Schizoaffective disorder, bipolar type without good prognostic features ([place]) [F25.0] 06/07/2017    Priority: High  . Tobacco use disorder [F17.200] 06/08/2017  . GERD (gastroesophageal reflux disease) [K21.9] 06/08/2017  . Cannabis abuse [F12.10]   . Dysphagia [R13.10] 05/28/2012  . Abdominal pain, chronic, epigastric [R10.13, G89.29] 05/28/2012  . Gallstones [K80.20] 08/06/2011  . Pancreatitis [K85.90] 08/06/2011  . Cannabis use disorder, moderate, dependence ([place]) [F12.20] 06/10/2011  . Schizophrenia, paranoid, chronic ([place]) [F20.0] 06/08/2011    Class: Acute   Total Time spent with patient: 30 minutes  Past Psychiatric History: schizophrenia  Past Medical History:  Past Medical History:  Diagnosis Date  . Bipolar 1 disorder ([place])   . Gallstones   . Pancreatitis    questionable mild biliary pancreatitis  . Schizophrenia, schizo-affective ([place])     Past Surgical History:  Procedure Laterality Date  . APPENDECTOMY    . CHOLECYSTECTOMY  02/28/2012   Procedure: LAPAROSCOPIC CHOLECYSTECTOMY;  Surgeon: Jamesetta So, MD;   Location: [facility];  Service: General;  Laterality: N/A;  . ESOPHAGOGASTRODUODENOSCOPY  07/11/2006   RMR: Esophageal foreign body (chicken bone), status post removal, as described above.  Complete esophagogastroduodenoscopy not carried out  . MULTIPLE TOOTH EXTRACTIONS     due to decay, nerve damage, edentulous   Family History:  Family History  Problem Relation Age of Onset  . Aneurysm Mother        brain  . Liver disease Mother   . Colon cancer Maternal Uncle        great uncle  . Pancreatitis Maternal Uncle        died age 71  . Stomach cancer Maternal Aunt        great aunt   Family Psychiatric  History: mother with bipolar Social History:  Social History   Substance and Sexual Activity  Alcohol Use No   Comment: former quit about 10 years ago     Social History   Substance and Sexual Activity  Drug Use Yes  . Types: Marijuana   Comment: marijuana end of Jan 2014     Social History   Socioeconomic History  . Marital status: Single    Spouse name: None  . Number of children: None  . Years of education: None  . Highest education level: None  Social Needs  . Financial resource strain: None  . Food insecurity - worry: None  . Food insecurity - inability: None  . Transportation needs - medical: None  . Transportation needs - non-medical: None  Occupational History  . None  Tobacco Use  . Smoking status: Current Every Day Smoker    Packs/day: 1.50  Years: 20.00    Pack years: 30.00    Types: Cigarettes  . Smokeless tobacco: Never Used  Substance and Sexual Activity  . Alcohol use: No    Comment: former quit about 10 years ago  . Drug use: Yes    Types: Marijuana    Comment: marijuana end of Jan 2014   . Sexual activity: Yes  Other Topics Concern  . None  Social History Narrative  . None   Additional Social History:                         Sleep: Fair  Appetite:  Fair  Current Medications: Current Facility-Administered Medications   Medication Dose Route Frequency Provider Last Rate Last Dose  . acetaminophen (TYLENOL) tablet 650 mg  650 mg Oral Q6H PRN Chauncey Mann, MD      . alum & mag hydroxide-simeth (MAALOX/MYLANTA) 200-200-20 MG/5ML suspension 30 mL  30 mL Oral Q4H PRN Chauncey Mann, MD      . cloZAPine (CLOZARIL) tablet 50 mg  50 mg Oral QHS Pucilowska, Jolanta B, MD      . divalproex (DEPAKOTE ER) 24 hr tablet 1,000 mg  1,000 mg Oral QHS Chauncey Mann, MD   1,000 mg at 06/08/17 2138  . magic mouthwash  5 mL Oral TID Chauncey Mann, MD   5 mL at 06/09/17 1754  . magnesium hydroxide (MILK OF MAGNESIA) suspension 30 mL  30 mL Oral Daily PRN Chauncey Mann, MD      . mirtazapine (REMERON) tablet 7.5 mg  7.5 mg Oral QHS Chauncey Mann, MD   7.5 mg at 06/08/17 2100  . nicotine (NICODERM CQ - dosed in mg/24 hours) patch 14 mg  14 mg Transdermal Daily Chauncey Mann, MD   14 mg at 06/09/17 0919  . OLANZapine (ZYPREXA) tablet 15 mg  15 mg Oral QHS Chauncey Mann, MD   15 mg at 06/08/17 2139  . pantoprazole (PROTONIX) EC tablet 40 mg  40 mg Oral Daily Chauncey Mann, MD   40 mg at 06/09/17 0919  . propranolol (INDERAL) tablet 10 mg  10 mg Oral TID Chauncey Mann, MD   10 mg at 06/09/17 1753    Lab Results:  Results for orders placed or performed during the hospital encounter of 06/07/17 (from the past 48 hour(s))  Hemoglobin A1c     Status: Abnormal   Collection Time: 06/08/17  7:13 AM  Result Value Ref Range   Hgb A1c MFr Bld 4.6 (L) 4.8 - 5.6 %    Comment: (NOTE) Pre diabetes:          5.7%-6.4% Diabetes:              >6.4% Glycemic control for   <7.0% adults with diabetes    Mean Plasma Glucose 85.32 mg/dL    Comment: Performed at [facility] [address], [place], [place] [address]  Lipid panel     Status: Abnormal   Collection Time: 06/08/17  7:13 AM  Result Value Ref Range   Cholesterol 156 0 - 200 mg/dL   Triglycerides 252 (H) <150 mg/dL   HDL 31 (L) >40 mg/dL   Total CHOL/HDL Ratio 5.0  RATIO   VLDL 50 (H) 0 - 40 mg/dL   LDL Cholesterol 75 0 - 99 mg/dL    Comment:        Total Cholesterol/HDL:CHD Risk Coronary Heart Disease Risk  Table                     Men   Women  1/2 Average Risk   3.4   3.3  Average Risk       5.0   4.4  2 X Average Risk   9.6   7.1  3 X Average Risk  23.4   11.0        Use the calculated Patient Ratio above and the CHD Risk Table to determine the patient's CHD Risk.        ATP III CLASSIFICATION (LDL):  <100     mg/dL   Optimal  100-129  mg/dL   Near or Above                    Optimal  130-159  mg/dL   Borderline  160-189  mg/dL   High  >190     mg/dL   Very High Performed at [facility], [address] [place], [place] [address]   Prolactin     Status: Abnormal   Collection Time: 06/08/17  7:13 AM  Result Value Ref Range   Prolactin 48.5 (H) 4.0 - 15.2 ng/mL    Comment: (NOTE) Performed At: [facility] [place], [place] [id number] Rush Farmer MD [id number] Performed at [facility], [address] [place], [place] [address]   TSH     Status: Abnormal   Collection Time: 06/08/17  7:13 AM  Result Value Ref Range   TSH 5.378 (H) 0.350 - 4.500 uIU/mL    Comment: Performed by a 3rd Generation assay with a functional sensitivity of <=0.01 uIU/mL. Performed at [facility], [address] [place], [place] [address]   Valproic acid level     Status: None   Collection Time: 06/08/17  7:13 AM  Result Value Ref Range   Valproic Acid Lvl 94 50.0 - 100.0 ug/mL    Comment: Performed at [facility], [address] [place], [place] [address]  CBC with Differential/Platelet     Status: Abnormal   Collection Time: 06/09/17  1:55 PM  Result Value Ref Range   WBC 8.8 3.8 - 10.6 K/uL   RBC 4.17 (L) 4.40 - 5.90 MIL/uL   Hemoglobin 13.6 13.0 - 18.0 g/dL   HCT 38.9 (L) 40.0 - 52.0 %   MCV 93.2 80.0 - 100.0 fL   MCH 32.5 26.0 - 34.0 pg   MCHC 34.9 32.0 - 36.0 g/dL    RDW 12.1 11.5 - 14.5 %   Platelets 127 (L) 150 - 440 K/uL   Neutrophils Relative % 63 %   Neutro Abs 5.5 1.4 - 6.5 K/uL   Lymphocytes Relative 20 %   Lymphs Abs 1.8 1.0 - 3.6 K/uL   Monocytes Relative 14 %   Monocytes Absolute 1.2 (H) 0.2 - 1.0 K/uL   Eosinophils Relative 2 %   Eosinophils Absolute 0.2 0 - 0.7 K/uL   Basophils Relative 1 %   Basophils Absolute 0.1 0 - 0.1 K/uL    Comment: Performed at [facility], [address] [place], [place] [address]  Valproic acid level     Status: None   Collection Time: 06/09/17  1:55 PM  Result Value Ref Range   Valproic Acid Lvl 83 50.0 - 100.0 ug/mL    Comment: Performed at [facility], [address], [place],  [address]    Blood Alcohol level:  Lab Results  Component Value Date   [facility] <10 06/06/2017   ETH <11 13/24/4010    Metabolic Disorder Labs: Lab Results  Component Value Date   HGBA1C 4.6 (L) 06/08/2017   MPG 85.32 06/08/2017   MPG 108 06/11/2011   Lab Results  Component Value Date   PROLACTIN 48.5 (H) 06/08/2017   Lab Results  Component Value Date   CHOL 156 06/08/2017   TRIG 252 (H) 06/08/2017   HDL 31 (L) 06/08/2017   CHOLHDL 5.0 06/08/2017   VLDL 50 (H) 06/08/2017   LDLCALC 75 06/08/2017   LDLCALC 63 06/11/2011    Physical Findings: AIMS:  , ,  ,  ,    CIWA:    COWS:     Musculoskeletal: Strength & Muscle Tone: within normal limits Gait & Station: normal Patient leans: N/A  Psychiatric Specialty Exam: Physical Exam  Nursing note and vitals reviewed. Psychiatric: His speech is normal. His affect is blunt. He is withdrawn and actively hallucinating. Cognition and memory are normal. He expresses impulsivity. He expresses homicidal and suicidal ideation.    Review of Systems  Neurological: Negative.   Psychiatric/Behavioral: Positive for hallucinations and suicidal ideas.  All other systems reviewed and are negative.   Blood pressure 124/72, pulse 72, temperature 98.2  F (36.8 C), temperature source Oral, resp. rate 16, height 5' 11"  (1.803 m), weight 68.2 kg (150 lb 4 oz), SpO2 100 %.Body mass index is 20.96 kg/m.  General Appearance: Casual  Eye Contact:  Good  Speech:  Clear and Coherent  Volume:  Normal  Mood:  Anxious  Affect:  Blunt  Thought Process:  Goal Directed and Descriptions of Associations: Intact  Orientation:  Full (Time, Place, and Person)  Thought Content:  Hallucinations: Auditory Command:  to kill himself and others and Paranoid Ideation  Suicidal Thoughts:  Yes.  with intent/plan  Homicidal Thoughts:  Yes.  without intent/plan  Memory:  Immediate;   Fair Recent;   Fair Remote;   Fair  Judgement:  Impaired  Insight:  Shallow  Psychomotor Activity:  Psychomotor Retardation  Concentration:  Concentration: Fair and Attention Span: Fair  Recall:  [employer] of Knowledge:  Fair  Language:  Fair  Akathisia:  Yes  Handed:  Right  AIMS (if indicated):     Assets:  Communication Skills Desire for Improvement Financial Resources/Insurance Housing Physical Health Resilience Social Support  ADL's:  Intact  Cognition:  WNL  Sleep:  Number of Hours: 5.45     Treatment Plan Summary: Daily contact with patient to assess and evaluate symptoms and progress in treatment and Medication management   Isaiah Peterson is a 42 year old male with a diagnosis of schizoaffective disorder who came to the emergency room under IVC endorsing suicidal and homicidal thoughts in the context of active psychotic symptoms. He attempted to buy a gun illegally prior to admission.   #Schizoaffective disorder bipolar type -discontinue Seroquel  -continue Zyprexa 10 mg -continue Depakote ER 1000 mg nightly  -continue [place] monthly injections, last injection given 05/30/17 -continue Propanolol 53m po TID to help with akathesia -start Clozapine 50 mg nightly  #Cannabis use disorder -patient was advised to abstain from marijuana and all illicit  drugs as they may worsen mood symptoms. -at this time, he is not interested in substance abuse treatment  #Nicotine use disorder -nicotine patch is available - he was advised to stop smoking completely as it may worsen his health  #Metabolic syndrome monitoring -lipid panel shows elevated TG,  TSH is 5.3,  Normal HgbA1C -EKG, QTc   #Admission status -voluntary admission  #Disposition -discharge to home with the mother -follow up with Dr. Isabelle Course.      Orson Slick, MD 06/09/2017, 7:23 PM

## 2017-06-09 NOTE — Plan of Care (Signed)
Patient is alert and oriented X 4. Patient denies SI, HI but is actively hearing voices tell patient to harm himself and others. Patient contracts for safety on the unit verbally stating he does not want to harm himself or anyone else. Patient complains of not resting well last night, " I woke up a few times last night."  Patient is pleasant and cooperative, but is very anxious and paces the hallways.Patients vitals are within normal levels this morning. Nurse will continue to monitor; safety checks will continue Q 15 minutes. Activity: Interest or engagement in activities will improve 06/09/2017 1216 - Progressing by Geraldo Docker, RN Sleeping patterns will improve 06/09/2017 1216 - Progressing by Geraldo Docker, RN   Education: Knowledge of [place] Education information/materials will improve 06/09/2017 1216 - Progressing by Geraldo Docker, RN Mental status will improve 06/09/2017 1216 - Progressing by Geraldo Docker, RN   Coping: Ability to verbalize frustrations and anger appropriately will improve 06/09/2017 1216 - Progressing by Geraldo Docker, RN Ability to demonstrate self-control will improve 06/09/2017 1216 - Progressing by Geraldo Docker, RN   Activity: Interest or engagement in leisure activities will improve 06/09/2017 1216 - Progressing by Geraldo Docker, RN

## 2017-06-09 NOTE — [facility] Group Notes (Signed)
LCSW Group Therapy Note   06/09/2017 1:00pm   Type of Therapy and Topic:  Group Therapy:  Overcoming Obstacles   Participation Level:  Active   Description of Group:    In this group patients will be encouraged to explore what they see as obstacles to their own wellness and recovery. They will be guided to discuss their thoughts, feelings, and behaviors related to these obstacles. The group will process together ways to cope with barriers, with attention given to specific choices patients can make. Each patient will be challenged to identify changes they are motivated to make in order to overcome their obstacles. This group will be process-oriented, with patients participating in exploration of their own experiences as well as giving and receiving support and challenge from other group members.   Therapeutic Goals: 1. Patient will identify personal and current obstacles as they relate to admission. 2. Patient will identify barriers that currently interfere with their wellness or overcoming obstacles.  3. Patient will identify feelings, thought process and behaviors related to these barriers. 4. Patient will identify two changes they are willing to make to overcome these obstacles:      Summary of Patient Progress   Pt shares he is having a good day and is hopeful that a trial of a new medication may be able to relieve the increase in symptoms he has been having.  He shares that this increase in voices has been his obstacle.   Therapeutic Modalities:   Cognitive Behavioral Therapy Solution Focused Therapy Motivational Interviewing Relapse Prevention Therapy  August Saucer, LCSW 06/09/2017 4:20 PM

## 2017-06-09 NOTE — Plan of Care (Signed)
Patient is safe in the unit and contract for safety of self and others, patient endorses hearing voices telling him to hurt himself and others, patient is given therapeutic encouragement and support to not hurt him self or others, patient denies any SI/HI at this time , 15 minute safety check is in progress. Progressing Activity: Interest or engagement in activities will improve 06/09/2017 2036 - Progressing by Clemens Catholic, RN Education: Mental status will improve 06/09/2017 2036 - Progressing by Clemens Catholic, RN Coping: Ability to verbalize frustrations and anger appropriately will improve 06/09/2017 2036 - Progressing by Clemens Catholic, RN Activity: Interest or engagement in leisure activities will improve 06/09/2017 2036 - Progressing by Clemens Catholic, RN Education: Utilization of techniques to improve thought processes will improve 06/09/2017 2036 - Progressing by Clemens Catholic, RN Clinical Measurements: Ability to maintain clinical measurements within normal limits will improve 06/09/2017 2036 - Progressing by Clemens Catholic, RN Activity: Risk for activity intolerance will decrease 06/09/2017 2036 - Progressing by Clemens Catholic, RN Elimination: Will not experience complications related to urinary retention 06/09/2017 2036 - Progressing by Clemens Catholic, RN Safety: Ability to remain free from injury will improve 06/09/2017 2036 - Progressing by Clemens Catholic, RN Skin Integrity: Risk for impaired skin integrity will decrease 06/09/2017 2036 - Progressing by Clemens Catholic, RN

## 2017-06-09 NOTE — Tx Team (Signed)
Interdisciplinary Treatment and Diagnostic Plan Update  06/09/2017 Time of Session: 06/09/2017 Isaiah Peterson. MRN: [id number]  Principal Diagnosis: Schizoaffective disorder, bipolar type without good prognostic features ([place])  Secondary Diagnoses: Principal Problem:   Schizoaffective disorder, bipolar type without good prognostic features ([place]) Active Problems:   Cannabis use disorder, moderate, dependence (HCC)   Tobacco use disorder   GERD (gastroesophageal reflux disease)   Cannabis abuse   Current Medications:  Current Facility-Administered Medications  Medication Dose Route Frequency Provider Last Rate Last Dose  . acetaminophen (TYLENOL) tablet 650 mg  650 mg Oral Q6H PRN Chauncey Mann, MD      . alum & mag hydroxide-simeth (MAALOX/MYLANTA) 200-200-20 MG/5ML suspension 30 mL  30 mL Oral Q4H PRN Chauncey Mann, MD      . cloZAPine (CLOZARIL) tablet 50 mg  50 mg Oral QHS Pucilowska, Jolanta B, MD      . divalproex (DEPAKOTE ER) 24 hr tablet 1,000 mg  1,000 mg Oral QHS Chauncey Mann, MD   1,000 mg at 06/08/17 2138  . magic mouthwash  5 mL Oral TID Chauncey Mann, MD   5 mL at 06/09/17 6606  . magnesium hydroxide (MILK OF MAGNESIA) suspension 30 mL  30 mL Oral Daily PRN Chauncey Mann, MD      . mirtazapine (REMERON) tablet 7.5 mg  7.5 mg Oral QHS Chauncey Mann, MD   7.5 mg at 06/08/17 2100  . nicotine (NICODERM CQ - dosed in mg/24 hours) patch 14 mg  14 mg Transdermal Daily Chauncey Mann, MD   14 mg at 06/09/17 0919  . OLANZapine (ZYPREXA) tablet 15 mg  15 mg Oral QHS Chauncey Mann, MD   15 mg at 06/08/17 2139  . pantoprazole (PROTONIX) EC tablet 40 mg  40 mg Oral Daily Chauncey Mann, MD   40 mg at 06/09/17 0919  . propranolol (INDERAL) tablet 10 mg  10 mg Oral TID Chauncey Mann, MD   10 mg at 06/09/17 0919   PTA Medications: Medications Prior to Admission  Medication Sig Dispense Refill Last Dose  . Alum & Mag Hydroxide-Simeth (MAGIC MOUTHWASH) SOLN Take 5 mLs by  mouth 3 (three) times daily. (Patient not taking: Reported on 06/06/2017) 120 mL 0 Not Taking at Unknown time  . divalproex (DEPAKOTE ER) 500 MG 24 hr tablet Take 1,000 mg by mouth at bedtime.    06/05/2017 at Unknown time  . HYDROcodone-acetaminophen (NORCO/VICODIN) 5-325 MG per tablet Take 1 tablet by mouth every 6 (six) hours as needed for pain. (Patient not taking: Reported on 06/06/2017) 20 tablet 0 Not Taking at Unknown time  . methocarbamol (ROBAXIN) 500 MG tablet Take 1 tablet (500 mg total) by mouth 4 (four) times daily. (Patient not taking: Reported on 06/06/2017) 30 tablet 0 Not Taking at Unknown time  . mirtazapine (REMERON) 7.5 MG tablet Take 1 tablet by mouth at bedtime.  1 06/05/2017 at Unknown time  . omeprazole (PRILOSEC) 20 MG capsule Take 1 capsule (20 mg total) by mouth daily. (Patient not taking: Reported on 06/06/2017) 30 capsule 0 Not Taking at Unknown time  . paliperidone (INVEGA SUSTENNA) 156 MG/ML SUSP injection Inject 1 mL into the muscle every 30 (thirty) days.    05/30/2017  . predniSONE (DELTASONE) 20 MG tablet 3 Tabs PO Days 1-3, then 2 tabs PO Days 4-6, then 1 tab PO Day 7-9, then Half Tab PO Day 10-12 (Patient not taking: Reported on 06/06/2017) 20 tablet 0  Not Taking at Unknown time  . propranolol (INDERAL) 80 MG tablet Take by mouth at bedtime.    06/05/2017 at Unknown time  . QUEtiapine (SEROQUEL) 400 MG tablet Take 400 mg by mouth at bedtime.    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   Physician Treatment Plan for Primary Diagnosis: Schizoaffective disorder, bipolar type without good prognostic features ([place]) Long Term Goal(s): Improvement in symptoms so as ready for  discharge Improvement in symptoms so as ready for discharge   Short Term Goals: Ability to identify changes in lifestyle to reduce recurrence of condition will improve Ability to verbalize feelings will improve Ability to disclose and discuss suicidal ideas Ability to demonstrate self-control will improve Ability to identify and develop effective coping behaviors will improve Ability to identify triggers associated with substance abuse/mental health issues will improve Ability to identify changes in lifestyle to reduce recurrence of condition will improve Ability to verbalize feelings will improve Ability to disclose and discuss suicidal ideas Ability to demonstrate self-control will improve Ability to identify and develop effective coping behaviors will improve Ability to identify triggers associated with substance abuse/mental health issues will improve  Medication Management: Evaluate patient's response, side effects, and tolerance of medication regimen.  Therapeutic Interventions: 1 to 1 sessions, Unit Group sessions and Medication administration.  Evaluation of Outcomes: Progressing  Physician Treatment Plan for Secondary Diagnosis: Principal Problem:   Schizoaffective disorder, bipolar type without good prognostic features ([place]) Active Problems:   Cannabis use disorder, moderate, dependence (HCC)   Tobacco use disorder   GERD (gastroesophageal reflux disease)   Cannabis abuse  Long Term Goal(s): Improvement in symptoms so as ready for discharge Improvement in symptoms so as ready for discharge   Short Term Goals: Ability to identify changes in lifestyle to reduce recurrence of condition will improve Ability to verbalize feelings will improve Ability to disclose and discuss suicidal ideas Ability to demonstrate self-control will improve Ability to identify and develop effective coping behaviors will improve Ability to identify triggers associated with substance abuse/mental  health issues will improve Ability to identify changes in lifestyle to reduce recurrence of condition will improve Ability to verbalize feelings will improve Ability to disclose and discuss suicidal ideas Ability to demonstrate self-control will improve Ability to identify and develop effective coping behaviors will improve Ability to identify triggers associated with substance abuse/mental health issues will improve     Medication Management: Evaluate patient's response, side effects, and tolerance of medication regimen.  Therapeutic Interventions: 1 to 1 sessions, Unit Group sessions and Medication administration.  Evaluation of Outcomes: Progressing   RN Treatment Plan for Primary Diagnosis: Schizoaffective disorder, bipolar type without good prognostic features ([place]) Long Term Goal(s): Knowledge of disease and therapeutic regimen to maintain health will improve  Short Term Goals: Ability to verbalize frustration and anger appropriately will improve, Ability to identify and develop effective coping behaviors will improve and Compliance with prescribed medications will improve  Medication Management: RN will administer medications as ordered by provider, will assess and evaluate patient's response and provide education to patient for prescribed medication. RN will report any adverse and/or side effects to prescribing provider.  Therapeutic Interventions: 1 on 1 counseling sessions, Psychoeducation, Medication administration, Evaluate responses to treatment, Monitor vital signs and CBGs as ordered,  Perform/monitor CIWA, COWS, AIMS and Fall Risk screenings as ordered, Perform wound care treatments as ordered.  Evaluation of Outcomes: Progressing   LCSW Treatment Plan for Primary Diagnosis: Schizoaffective disorder, bipolar type without good prognostic features ([place]) Long Term Goal(s): Safe transition to appropriate next level of care at discharge, Engage patient in therapeutic group  addressing interpersonal concerns.  Short Term Goals: Engage patient in aftercare planning with referrals and resources, Identify triggers associated with mental health/substance abuse issues and Increase skills for wellness and recovery  Therapeutic Interventions: Assess for all discharge needs, 1 to 1 time with Social worker, Explore available resources and support systems, Assess for adequacy in community support network, Educate family and significant other(s) on suicide prevention, Complete Psychosocial Assessment, Interpersonal group therapy.  Evaluation of Outcomes: Progressing   Progress in Treatment: Attending groups: Yes. Participating in groups: Yes. Taking medication as prescribed: Yes. Toleration medication: Yes. Family/Significant other contact made: No, will contact:    Patient understands diagnosis: Yes. Discussing patient identified problems/goals with staff: Yes. Medical problems stabilized or resolved: Yes. Denies suicidal/homicidal ideation: Yes. Issues/concerns per patient self-inventory: Yes. Other:    New problem(s) identified: No, Describe:     New Short Term/Long Term Goal(s): Try new medications as Hallucinations have worsened even with medication compliance.  Discharge Plan or Barriers: Home with follow up with [facility]  Reason for Continuation of Hospitalization: Delusions  Hallucinations Medication stabilization  Estimated Length of Stay: 3-5 days  Attendees: Patient:Isaiah Peterson 06/09/2017 3:58 PM  Physician: Orson Slick 06/09/2017 3:58 PM  Nursing: Roxy Manns, RN 06/09/2017 3:58 PM  RN Care Manager: 06/09/2017 3:58 PM  Social Worker: Dossie Arbour, LCSW 06/09/2017 3:58 PM  Recreational Therapist:  06/09/2017 3:58 PM  Other:  06/09/2017 3:58 PM  Other:  06/09/2017 3:58 PM  Other: 06/09/2017 3:58 PM    Scribe for Treatment Team: August Saucer, LCSW 06/09/2017 3:58 PM

## 2017-06-10 MED ORDER — ENSURE ENLIVE PO LIQD
237.0000 mL | Freq: Three times a day (TID) | ORAL | Status: DC
  Administered 2017-06-10 – 2017-06-17 (×20): 237 mL via ORAL

## 2017-06-10 NOTE — Plan of Care (Signed)
  Progressing Anxiety Demonstrates healthy coping skills 06/10/2017 2007 - Progressing by Rise Mu, RN Education: Knowledge of [place] Education information/materials will improve 06/10/2017 2007 - Progressing by Rise Mu, RN Mental status will improve 06/10/2017 2007 - Progressing by Rise Mu, RN Coping: Ability to verbalize frustrations and anger appropriately will improve 06/10/2017 2007 - Progressing by Rise Mu, RN Ability to demonstrate self-control will improve 06/10/2017 2007 - Progressing by Rise Mu, RN Activity: Interest or engagement in leisure activities will improve 06/10/2017 2007 - Progressing by Rise Mu, RN Education: Utilization of techniques to improve thought processes will improve 06/10/2017 2007 - Progressing by Rise Mu, RN Education: Knowledge of General Education information will improve 06/10/2017 2007 - Progressing by Rise Mu, RN Health Behavior/Discharge Planning: Ability to manage health-related needs will improve 06/10/2017 2007 - Progressing by Rise Mu, RN Clinical Measurements: Ability to maintain clinical measurements within normal limits will improve 06/10/2017 2007 - Progressing by Rise Mu, RN Will remain free from infection 06/10/2017 2007 - Progressing by Rise Mu, RN Diagnostic test results will improve 06/10/2017 2007 - Progressing by Rise Mu, RN Respiratory complications will improve 06/10/2017 2007 - Progressing by Rise Mu, RN Cardiovascular complication will be avoided 06/10/2017 2007 - Progressing by Rise Mu, RN Activity: Risk for activity intolerance will decrease 06/10/2017 2007 - Progressing by Rise Mu, RN Nutrition: Adequate nutrition will be maintained 06/10/2017 2007 - Progressing by Rise Mu, RN Coping: Level of anxiety will decrease 06/10/2017 2007 - Progressing by Rise Mu, RN Elimination: Will not experience  complications related to bowel motility 06/10/2017 2007 - Progressing by Rise Mu, RN Will not experience complications related to urinary retention 06/10/2017 2007 - Progressing by Rise Mu, RN Safety: Ability to remain free from injury will improve 06/10/2017 2007 - Progressing by Rise Mu, RN Skin Integrity: Risk for impaired skin integrity will decrease 06/10/2017 2007 - Progressing by Rise Mu, RN   Not Progressing Activity: Interest or engagement in activities will improve 06/10/2017 2007 - Not Progressing by Rise Mu, RN Note Isolates to room

## 2017-06-10 NOTE — [facility] Group Notes (Signed)
06/10/2017 1PM  Type of Therapy/Topic:  Group Therapy:  Feelings about Diagnosis  Participation Level:  Did Not Attend   Description of Group:   This group will allow patients to explore their thoughts and feelings about diagnoses they have received. Patients will be guided to explore their level of understanding and acceptance of these diagnoses. Facilitator will encourage patients to process their thoughts and feelings about the reactions of others to their diagnosis and will guide patients in identifying ways to discuss their diagnosis with significant others in their lives. This group will be process-oriented, with patients participating in exploration of their own experiences, giving and receiving support, and processing challenge from other group members.   Therapeutic Goals: 1. Patient will demonstrate understanding of diagnosis as evidenced by identifying two or more symptoms of the disorder 2. Patient will be able to express two feelings regarding the diagnosis 3. Patient will demonstrate their ability to communicate their needs through discussion and/or role play  Summary of Patient Progress: Patient was encouraged and invited to attend group. Patient did not attend group. Social worker will continue to encourage group participation in the future.        Therapeutic Modalities:   Cognitive Behavioral Therapy Brief Therapy Feelings Identification    Darin Engels, LCSW 06/10/2017 1:47 PM

## 2017-06-10 NOTE — Progress Notes (Signed)
[facility] MD Progress Note  06/10/2017 2:10 PM [place].  MRN:  [id number]  Subjective:   Mr. Bodi started Clozapine last night. He reports interrupted sleep but no side effects from medications. There are no somatic complaints. Appetite is poor.  Treatment plan. We continue Invega sustenna injections and Clozapine titration. Will increase Clozapine to 100 mh tomorrow.  Social/disposition. He will return home. He will follow up with [facility].  Principal Problem: Schizoaffective disorder, bipolar type without good prognostic features ([place]) Diagnosis:   Patient Active Problem List   Diagnosis Date Noted  . Schizoaffective disorder, bipolar type without good prognostic features ([place]) [F25.0] 06/07/2017    Priority: High  . Tobacco use disorder [F17.200] 06/08/2017  . GERD (gastroesophageal reflux disease) [K21.9] 06/08/2017  . Cannabis abuse [F12.10]   . Dysphagia [R13.10] 05/28/2012  . Abdominal pain, chronic, epigastric [R10.13, G89.29] 05/28/2012  . Gallstones [K80.20] 08/06/2011  . Pancreatitis [K85.90] 08/06/2011  . Cannabis use disorder, moderate, dependence ([place]) [F12.20] 06/10/2011  . Schizophrenia, paranoid, chronic ([place]) [F20.0] 06/08/2011    Class: Acute   Total Time spent with patient: 30 minutes  Past Psychiatric History: schizophrenia  Past Medical History:  Past Medical History:  Diagnosis Date  . Bipolar 1 disorder ([place])   . Gallstones   . Pancreatitis    questionable mild biliary pancreatitis  . Schizophrenia, schizo-affective ([place])     Past Surgical History:  Procedure Laterality Date  . APPENDECTOMY    . CHOLECYSTECTOMY  02/28/2012   Procedure: LAPAROSCOPIC CHOLECYSTECTOMY;  Surgeon: Jamesetta So, MD;  Location: [facility];  Service: General;  Laterality: N/A;  . ESOPHAGOGASTRODUODENOSCOPY  07/11/2006   RMR: Esophageal foreign body (chicken bone), status post removal, as described above.  Complete esophagogastroduodenoscopy not carried out  . MULTIPLE  TOOTH EXTRACTIONS     due to decay, nerve damage, edentulous   Family History:  Family History  Problem Relation Age of Onset  . Aneurysm Mother        brain  . Liver disease Mother   . Colon cancer Maternal Uncle        great uncle  . Pancreatitis Maternal Uncle        died age 55  . Stomach cancer Maternal Aunt        great aunt   Family Psychiatric  History: mother with bipolar Social History:  Social History   Substance and Sexual Activity  Alcohol Use No   Comment: former quit about 10 years ago     Social History   Substance and Sexual Activity  Drug Use Yes  . Types: Marijuana   Comment: marijuana end of Jan 2014     Social History   Socioeconomic History  . Marital status: Single    Spouse name: None  . Number of children: None  . Years of education: None  . Highest education level: None  Social Needs  . Financial resource strain: None  . Food insecurity - worry: None  . Food insecurity - inability: None  . Transportation needs - medical: None  . Transportation needs - non-medical: None  Occupational History  . None  Tobacco Use  . Smoking status: Current Every Day Smoker    Packs/day: 1.50    Years: 20.00    Pack years: 30.00    Types: Cigarettes  . Smokeless tobacco: Never Used  Substance and Sexual Activity  . Alcohol use: No    Comment: former quit about 10 years ago  . Drug use: Yes  Types: Marijuana    Comment: marijuana end of Jan 2014   . Sexual activity: Yes  Other Topics Concern  . None  Social History Narrative  . None   Additional Social History:                         Sleep: Poor  Appetite:  Poor  Current Medications: Current Facility-Administered Medications  Medication Dose Route Frequency Provider Last Rate Last Dose  . acetaminophen (TYLENOL) tablet 650 mg  650 mg Oral Q6H PRN Chauncey Mann, MD      . alum & mag hydroxide-simeth (MAALOX/MYLANTA) 200-200-20 MG/5ML suspension 30 mL  30 mL Oral Q4H PRN  Chauncey Mann, MD      . cloZAPine (CLOZARIL) tablet 50 mg  50 mg Oral QHS Finnigan Warriner B, MD   50 mg at 06/09/17 2120  . divalproex (DEPAKOTE ER) 24 hr tablet 1,000 mg  1,000 mg Oral QHS Chauncey Mann, MD   1,000 mg at 06/09/17 2119  . magic mouthwash  5 mL Oral TID Chauncey Mann, MD   5 mL at 06/10/17 1236  . magnesium hydroxide (MILK OF MAGNESIA) suspension 30 mL  30 mL Oral Daily PRN Chauncey Mann, MD      . mirtazapine (REMERON) tablet 7.5 mg  7.5 mg Oral QHS Chauncey Mann, MD   7.5 mg at 06/09/17 2120  . nicotine (NICODERM CQ - dosed in mg/24 hours) patch 14 mg  14 mg Transdermal Daily Chauncey Mann, MD   14 mg at 06/10/17 2956  . OLANZapine (ZYPREXA) tablet 15 mg  15 mg Oral QHS Chauncey Mann, MD   15 mg at 06/09/17 2120  . pantoprazole (PROTONIX) EC tablet 40 mg  40 mg Oral Daily Chauncey Mann, MD   40 mg at 06/10/17 2130  . propranolol (INDERAL) tablet 10 mg  10 mg Oral TID Chauncey Mann, MD   10 mg at 06/10/17 1237    Lab Results:  Results for orders placed or performed during the hospital encounter of 06/07/17 (from the past 48 hour(s))  CBC with Differential/Platelet     Status: Abnormal   Collection Time: 06/09/17  1:55 PM  Result Value Ref Range   WBC 8.8 3.8 - 10.6 K/uL   RBC 4.17 (L) 4.40 - 5.90 MIL/uL   Hemoglobin 13.6 13.0 - 18.0 g/dL   HCT 38.9 (L) 40.0 - 52.0 %   MCV 93.2 80.0 - 100.0 fL   MCH 32.5 26.0 - 34.0 pg   MCHC 34.9 32.0 - 36.0 g/dL   RDW 12.1 11.5 - 14.5 %   Platelets 127 (L) 150 - 440 K/uL   Neutrophils Relative % 63 %   Neutro Abs 5.5 1.4 - 6.5 K/uL   Lymphocytes Relative 20 %   Lymphs Abs 1.8 1.0 - 3.6 K/uL   Monocytes Relative 14 %   Monocytes Absolute 1.2 (H) 0.2 - 1.0 K/uL   Eosinophils Relative 2 %   Eosinophils Absolute 0.2 0 - 0.7 K/uL   Basophils Relative 1 %   Basophils Absolute 0.1 0 - 0.1 K/uL    Comment: Performed at [facility], [address] [place], [place] [address]  Valproic acid level     Status: None    Collection Time: 06/09/17  1:55 PM  Result Value Ref Range   Valproic Acid Lvl 83 50.0 - 100.0 ug/mL    Comment: Performed at [employer]  [facility], [address] [place], [place] [address]    Blood Alcohol level:  Lab Results  Component Value Date   [facility] <10 06/06/2017   ETH <11 30/86/5784    Metabolic Disorder Labs: Lab Results  Component Value Date   HGBA1C 4.6 (L) 06/08/2017   MPG 85.32 06/08/2017   MPG 108 06/11/2011   Lab Results  Component Value Date   PROLACTIN 48.5 (H) 06/08/2017   Lab Results  Component Value Date   CHOL 156 06/08/2017   TRIG 252 (H) 06/08/2017   HDL 31 (L) 06/08/2017   CHOLHDL 5.0 06/08/2017   VLDL 50 (H) 06/08/2017   LDLCALC 75 06/08/2017   LDLCALC 63 06/11/2011    Physical Findings: AIMS:  , ,  ,  ,    CIWA:    COWS:     Musculoskeletal: Strength & Muscle Tone: within normal limits Gait & Station: normal Patient leans: N/A  Psychiatric Specialty Exam: Physical Exam  Nursing note and vitals reviewed. Psychiatric: He has a normal mood and affect. His speech is normal. He is actively hallucinating. Cognition and memory are impaired. He expresses impulsivity. He expresses suicidal ideation.    Review of Systems  Neurological: Negative.   Psychiatric/Behavioral: Positive for hallucinations. The patient has insomnia.   All other systems reviewed and are negative.   Blood pressure 129/83, pulse 90, temperature 98.1 F (36.7 C), temperature source Oral, resp. rate 16, height 5\' 11"  (1.803 m), weight 68.2 kg (150 lb 4 oz), SpO2 98 %.Body mass index is 20.96 kg/m.  General Appearance: Casual  Eye Contact:  Good  Speech:  Clear and Coherent  Volume:  Normal  Mood:  Euthymic  Affect:  Blunt  Thought Process:  Goal Directed and Descriptions of Associations: Intact  Orientation:  Full (Time, Place, and Person)  Thought Content:  Hallucinations: Auditory  Suicidal Thoughts:  Yes.  without intent/plan  Homicidal Thoughts:  Yes.   without intent/plan  Memory:  Immediate;   Fair Recent;   Fair Remote;   Fair  Judgement:  Poor  Insight:  Shallow  Psychomotor Activity:  Normal  Concentration:  Concentration: Fair and Attention Span: Fair  Recall:  [employer] of Knowledge:  Fair  Language:  Fair  Akathisia:  No  Handed:  Right  AIMS (if indicated):     Assets:  Communication Skills Desire for Improvement Financial Resources/Insurance Housing Physical Health Resilience Social Support  ADL's:  Intact  Cognition:  WNL  Sleep:  Number of Hours: 5.15     Treatment Plan Summary: Daily contact with patient to assess and evaluate symptoms and progress in treatment and Medication management   Mr. Klas is a 42 year old male with a diagnosis of schizoaffective disorder who came to the emergency room under IVC endorsing suicidal and homicidal thoughts in the context of active psychotic symptoms. He attempted to buy a gun illegally prior to admission.   #Schizoaffective disorder bipolar type -discontinue Seroquel -continue Zyprexa 10 mg -continue Depakote ER 1000 mg nightly  -continue [place] monthly injections, last injection given 05/30/17 -continue Propanolol 10mg  po TIDto help with akathesia -continue Clozapine 50 mg nightly  #Cannabis use disorder -patient was advised to abstain from marijuana and all illicit drugs as they may worsen mood symptoms. -at this time, he is not interested in substance abuse treatment  #Nicotine use disorder -nicotine patch is available - he was advised to stop smoking completely as it may worsen his health  #Poor oral intake -poor dentition,  soft diet -Ensure  #Metabolic syndrome monitoring -lipid panel shows elevated TG, TSH is 5.3,  Normal HgbA1C -EKG, QTc 431  #Admission status -voluntary admission  #Disposition -discharge to home with the mother -follow up with Dr. Isabelle Course.      Orson Slick, MD 06/10/2017, 2:10 PM

## 2017-06-10 NOTE — [facility] Group Notes (Signed)
  06/10/2017  Time: 0900  Type of Therapy and Topic:  Group Therapy:  Setting Goals Participation Level:  Did Not Attend  Description of Group: In this process group, patients discussed using strengths to work toward goals and address challenges.  Patients identified two positive things about themselves and one goal they were working on.  Patients were given the opportunity to share openly and support each other's plan for self-empowerment.  The group discussed the value of gratitude and were encouraged to have a daily reflection of positive characteristics or circumstances.  Patients were encouraged to identify a plan to utilize their strengths to work on current challenges and goals.  Therapeutic Goals 1. Patient will verbalize personal strengths/positive qualities and relate how these can assist with achieving desired personal goals 2. Patients will verbalize affirmation of peers plans for personal change and goal setting 3. Patients will explore the value of gratitude and positive focus as related to successful achievement of goals 4. Patients will verbalize a plan for regular reinforcement of personal positive qualities and circumstances.  Summary of Patient Progress: Pt was invited to attend group but chose not to attend. CSW will continue to encourage pt to attend group throughout their admission.    Therapeutic Modalities Cognitive Behavioral Therapy Motivational Interviewing   Alden Hipp, MSW, LCSW 06/10/2017 9:31 AM

## 2017-06-11 MED ORDER — CLOZAPINE 100 MG PO TABS
100.0000 mg | ORAL_TABLET | Freq: Every day | ORAL | Status: DC
  Administered 2017-06-11 – 2017-06-12 (×2): 100 mg via ORAL
  Filled 2017-06-11: qty 1
  Filled 2017-06-11: qty 4

## 2017-06-11 MED ORDER — DOCUSATE SODIUM 100 MG PO CAPS
100.0000 mg | ORAL_CAPSULE | Freq: Every day | ORAL | Status: DC
  Administered 2017-06-11 – 2017-06-17 (×6): 100 mg via ORAL
  Filled 2017-06-11 (×6): qty 1

## 2017-06-11 MED ORDER — POLYETHYLENE GLYCOL 3350 17 G PO PACK
17.0000 g | PACK | Freq: Every day | ORAL | Status: DC
  Administered 2017-06-11 – 2017-06-15 (×4): 17 g via ORAL
  Filled 2017-06-11 (×6): qty 1

## 2017-06-11 NOTE — Progress Notes (Signed)
Patient ID: Isaiah Peterson., male   DOB: 08-18-1975, 42 y.o.   MRN: [id number] PER STATE REGULATIONS 482.30  THIS CHART WAS REVIEWED FOR MEDICAL NECESSITY WITH RESPECT TO THE PATIENT'S ADMISSION/DURATION OF STAY.  NEXT REVIEW DATE:06/15/17  Roma Schanz, RN, BSN CASE MANAGER

## 2017-06-11 NOTE — Progress Notes (Signed)
Patient ID: Isaiah Peterson., male   DOB: 1975-11-24, 42 y.o.   MRN: [id number] PER STATE REGULATIONS 482.30  THIS CHART WAS REVIEWED FOR MEDICAL NECESSITY WITH RESPECT TO THE PATIENT'S ADMISSION/ DURATION OF STAY.  NEXT REVIEW DATE: 06/15/2017  Chauncy Lean, RN, BSN CASE MANAGER

## 2017-06-11 NOTE — [facility] Group Notes (Signed)
LCSW Group Therapy Note  06/11/2017 1:00 pm  Type of Therapy/Topic:  Group Therapy:  Emotion Regulation  Participation Level:  Minimal   Description of Group:    The purpose of this group is to assist patients in learning to regulate negative emotions and experience positive emotions. Patients will be guided to discuss ways in which they have been vulnerable to their negative emotions. These vulnerabilities will be juxtaposed with experiences of positive emotions or situations, and patients will be challenged to use positive emotions to combat negative ones. Special emphasis will be placed on coping with negative emotions in conflict situations, and patients will process healthy conflict resolution skills.  Therapeutic Goals: 1. Patient will identify two positive emotions or experiences to reflect on in order to balance out negative emotions 2. Patient will label two or more emotions that they find the most difficult to experience 3. Patient will demonstrate positive conflict resolution skills through discussion and/or role plays  Summary of Patient Progress: Isaiah Peterson did not participate much in today's group.  He checked in stating "I'm feeling restless".  Isaiah Peterson did identify several positive and negative emotions that he has experienced to include "excited" and "disappointed".  Isaiah Peterson left out to group following this participation or approximately 15 minutes from the time the group began.      Therapeutic Modalities:   Cognitive Behavioral Therapy Feelings Identification Dialectical Behavioral Therapy

## 2017-06-11 NOTE — Progress Notes (Signed)
Patient ID: Isaiah Peterson., male   DOB: 1975-11-30, 43 y.o.   MRN: [id number] A&Ox3, denied pain, pleasant, disheveled, poorly kept skin condition, ambulating with "non-swing arms", denied SI/HI, still responding to I/S.

## 2017-06-11 NOTE — Plan of Care (Signed)
Patient slept for Estimated Hours of 7; Precautionary checks every 15 minutes for safety maintained, room free of safety hazards, patient sustains no injury or falls during this shift.  

## 2017-06-11 NOTE — Progress Notes (Signed)
[facility] MD Progress Note  06/11/2017 3:29 PM Isaiah Daune Perch.  MRN:  [id number]  Subjective:    Isaiah Peterson still experiences auditory hallucinations but no longer commands. Sleep and appetite are good. Tolerates medications well, complains of constipation.  Treatment plan. Continue invega sustenna injections, Clozapine titration and Depakote.  Social/disposition. Discharge to home, follow up with [facility].  Principal Problem: Schizoaffective disorder, bipolar type without good prognostic features ([place]) Diagnosis:   Patient Active Problem List   Diagnosis Date Noted  . Schizoaffective disorder, bipolar type without good prognostic features ([place]) [F25.0] 06/07/2017    Priority: High  . Tobacco use disorder [F17.200] 06/08/2017  . GERD (gastroesophageal reflux disease) [K21.9] 06/08/2017  . Cannabis abuse [F12.10]   . Dysphagia [R13.10] 05/28/2012  . Abdominal pain, chronic, epigastric [R10.13, G89.29] 05/28/2012  . Gallstones [K80.20] 08/06/2011  . Pancreatitis [K85.90] 08/06/2011  . Cannabis use disorder, moderate, dependence ([place]) [F12.20] 06/10/2011  . Schizophrenia, paranoid, chronic ([place]) [F20.0] 06/08/2011    Class: Acute   Total Time spent with patient: 30 minutes  Past Psychiatric History: schizophrenia  Past Medical History:  Past Medical History:  Diagnosis Date  . Bipolar 1 disorder ([place])   . Gallstones   . Pancreatitis    questionable mild biliary pancreatitis  . Schizophrenia, schizo-affective ([place])     Past Surgical History:  Procedure Laterality Date  . APPENDECTOMY    . CHOLECYSTECTOMY  02/28/2012   Procedure: LAPAROSCOPIC CHOLECYSTECTOMY;  Surgeon: Jamesetta So, MD;  Location: [facility];  Service: General;  Laterality: N/A;  . ESOPHAGOGASTRODUODENOSCOPY  07/11/2006   RMR: Esophageal foreign body (chicken bone), status post removal, as described above.  Complete esophagogastroduodenoscopy not carried out  . MULTIPLE TOOTH EXTRACTIONS     due to decay, nerve  damage, edentulous   Family History:  Family History  Problem Relation Age of Onset  . Aneurysm Mother        brain  . Liver disease Mother   . Colon cancer Maternal Uncle        great uncle  . Pancreatitis Maternal Uncle        died age 10  . Stomach cancer Maternal Aunt        great aunt   Family Psychiatric  History: bipolar Social History:  Social History   Substance and Sexual Activity  Alcohol Use No   Comment: former quit about 10 years ago     Social History   Substance and Sexual Activity  Drug Use Yes  . Types: Marijuana   Comment: marijuana end of Jan 2014     Social History   Socioeconomic History  . Marital status: Single    Spouse name: None  . Number of children: None  . Years of education: None  . Highest education level: None  Social Needs  . Financial resource strain: None  . Food insecurity - worry: None  . Food insecurity - inability: None  . Transportation needs - medical: None  . Transportation needs - non-medical: None  Occupational History  . None  Tobacco Use  . Smoking status: Current Every Day Smoker    Packs/day: 1.50    Years: 20.00    Pack years: 30.00    Types: Cigarettes  . Smokeless tobacco: Never Used  Substance and Sexual Activity  . Alcohol use: No    Comment: former quit about 10 years ago  . Drug use: Yes    Types: Marijuana    Comment: marijuana end of Jan 2014   .  Sexual activity: Yes  Other Topics Concern  . None  Social History Narrative  . None   Additional Social History:                         Sleep: Fair  Appetite:  Fair  Current Medications: Current Facility-Administered Medications  Medication Dose Route Frequency Provider Last Rate Last Dose  . acetaminophen (TYLENOL) tablet 650 mg  650 mg Oral Q6H PRN Chauncey Mann, MD      . alum & mag hydroxide-simeth (MAALOX/MYLANTA) 200-200-20 MG/5ML suspension 30 mL  30 mL Oral Q4H PRN Chauncey Mann, MD      . cloZAPine (CLOZARIL) tablet  100 mg  100 mg Oral QHS Perline Awe B, MD      . divalproex (DEPAKOTE ER) 24 hr tablet 1,000 mg  1,000 mg Oral QHS Chauncey Mann, MD   1,000 mg at 06/10/17 2116  . docusate sodium (COLACE) capsule 100 mg  100 mg Oral Daily Nora Rooke B, MD   100 mg at 06/11/17 1513  . feeding supplement (ENSURE ENLIVE) (ENSURE ENLIVE) liquid 237 mL  237 mL Oral TID BM Colbie Sliker B, MD   237 mL at 06/10/17 2000  . magic mouthwash  5 mL Oral TID Chauncey Mann, MD   5 mL at 06/11/17 1514  . magnesium hydroxide (MILK OF MAGNESIA) suspension 30 mL  30 mL Oral Daily PRN Chauncey Mann, MD      . mirtazapine (REMERON) tablet 7.5 mg  7.5 mg Oral QHS Chauncey Mann, MD   7.5 mg at 06/10/17 2118  . nicotine (NICODERM CQ - dosed in mg/24 hours) patch 14 mg  14 mg Transdermal Daily Chauncey Mann, MD   14 mg at 06/11/17 0856  . OLANZapine (ZYPREXA) tablet 15 mg  15 mg Oral QHS Chauncey Mann, MD   15 mg at 06/10/17 2117  . pantoprazole (PROTONIX) EC tablet 40 mg  40 mg Oral Daily Chauncey Mann, MD   40 mg at 06/11/17 0855  . polyethylene glycol (MIRALAX / GLYCOLAX) packet 17 g  17 g Oral Daily Michaeljames Milnes B, MD   17 g at 06/11/17 1513  . propranolol (INDERAL) tablet 10 mg  10 mg Oral TID Chauncey Mann, MD   10 mg at 06/11/17 1514    Lab Results: No results found for this or any previous visit (from the past 48 hour(s)).  Blood Alcohol level:  Lab Results  Component Value Date   [facility] <10 06/06/2017   ETH <11 70/62/3762    Metabolic Disorder Labs: Lab Results  Component Value Date   HGBA1C 4.6 (L) 06/08/2017   MPG 85.32 06/08/2017   MPG 108 06/11/2011   Lab Results  Component Value Date   PROLACTIN 48.5 (H) 06/08/2017   Lab Results  Component Value Date   CHOL 156 06/08/2017   TRIG 252 (H) 06/08/2017   HDL 31 (L) 06/08/2017   CHOLHDL 5.0 06/08/2017   VLDL 50 (H) 06/08/2017   LDLCALC 75 06/08/2017   LDLCALC 63 06/11/2011    Physical Findings: AIMS:  , ,  ,  ,     CIWA:    COWS:     Musculoskeletal: Strength & Muscle Tone: within normal limits Gait & Station: normal Patient leans: N/A  Psychiatric Specialty Exam: Physical Exam  Nursing note and vitals reviewed. Psychiatric: His speech is normal. His affect is blunt. He is withdrawn and  actively hallucinating. Thought content is paranoid. Cognition and memory are normal. He expresses impulsivity.    Review of Systems  Neurological: Negative.   Psychiatric/Behavioral: Positive for hallucinations.  All other systems reviewed and are negative.   Blood pressure 131/88, pulse 92, temperature 98.7 F (37.1 C), temperature source Oral, resp. rate 18, height 5\' 11"  (1.803 m), weight 68.2 kg (150 lb 4 oz), SpO2 98 %.Body mass index is 20.96 kg/m.  General Appearance: Fairly Groomed  Eye Contact:  Good  Speech:  Clear and Coherent  Volume:  Normal  Mood:  Dysphoric  Affect:  Congruent  Thought Process:  Goal Directed and Descriptions of Associations: Intact  Orientation:  Full (Time, Place, and Person)  Thought Content:  Hallucinations: Auditory  Suicidal Thoughts:  No  Homicidal Thoughts:  No  Memory:  Immediate;   Fair Recent;   Fair Remote;   Fair  Judgement:  Impaired  Insight:  Shallow  Psychomotor Activity:  Psychomotor Retardation  Concentration:  Concentration: Fair and Attention Span: Fair  Recall:  [employer] of Knowledge:  Fair  Language:  Fair  Akathisia:  No  Handed:  Right  AIMS (if indicated):     Assets:  Communication Skills Desire for Improvement Financial Resources/Insurance Housing Physical Health Resilience Social Support  ADL's:  Intact  Cognition:  WNL  Sleep:  Number of Hours: 7     Treatment Plan Summary: Daily contact with patient to assess and evaluate symptoms and progress in treatment and Medication management   Isaiah Peterson is a 42 year old male with a diagnosis of schizoaffective disorder who came to the emergency room under IVC endorsing  suicidal and homicidal thoughts in the context of active psychotic symptoms. He attempted to buy a gun illegally prior to admission.   #Schizoaffective disorder bipolar type -discontinue Seroquel -continue Zyprexa 10 mg -continue Depakote ER 1000 mg nightly  -continue [place] monthly injections, last injection given 05/30/17 -continue Propanolol 10mg  po TIDto help with akathesia -increase Clozapine to 100 mg nightly   #Cannabis use disorder -patient was advised to abstain from marijuana and all illicit drugs as they may worsen mood symptoms. -at this time, he is not interested in substance abuse treatment  #Nicotine use disorder -nicotine patch is available - he was advised to stop smoking completely as it may worsen his health  #Poor oral intake -poor dentition, soft diet -Ensure  #Metabolic syndrome monitoring -lipid panel shows elevated TG, TSH is 5.3, Normal HgbA1C -EKG, QTc431  #Admission status -voluntary admission  #Disposition -discharge to home with the mother -follow up with Dr. Isabelle Course.     Orson Slick, MD 06/11/2017, 3:29 PM

## 2017-06-11 NOTE — Progress Notes (Signed)
Received Isaiah Peterson this PM while walking to his room, he is pleasant. He stated the voices are decreased without audible understanding. He feels a little anxious and depressed, but stated it's much better since his admission. He slept well last night, 7 hours.

## 2017-06-11 NOTE — Plan of Care (Signed)
  Progressing Anxiety Demonstrates healthy coping skills 06/11/2017 1904 - Progressing by Rise Mu, RN Activity: Interest or engagement in activities will improve 06/11/2017 1904 - Progressing by Rise Mu, RN Note Attending groups.  More visible in the milieu. Noted playing cards with peers.  Outside with peers during outside time.  Education: Mental status will improve 06/11/2017 1904 - Progressing by Rise Mu, RN Note Continues to endorse AH although states that they are not as loud and now she cannot make out what they are saying.   Coping: Ability to verbalize frustrations and anger appropriately will improve 06/11/2017 1904 - Progressing by Rise Mu, RN Note Verbalizes feelings and concerns appropriately.   Ability to demonstrate self-control will improve 06/11/2017 1904 - Progressing by Rise Mu, RN Note Pleasant and cooperative.   Activity: Interest or engagement in leisure activities will improve 06/11/2017 1904 - Progressing by Rise Mu, RN Education: Utilization of techniques to improve thought processes will improve 06/11/2017 1904 - Progressing by Rise Mu, RN Education: Knowledge of General Education information will improve 06/11/2017 1904 - Progressing by Rise Mu, RN Health Behavior/Discharge Planning: Ability to manage health-related needs will improve 06/11/2017 1904 - Progressing by Rise Mu, RN Clinical Measurements: Ability to maintain clinical measurements within normal limits will improve 06/11/2017 1904 - Progressing by Rise Mu, RN Will remain free from infection 06/11/2017 1904 - Progressing by Rise Mu, RN Nutrition: Adequate nutrition will be maintained 06/11/2017 1904 - Progressing by Rise Mu, RN Elimination: Will not experience complications related to bowel motility 06/11/2017 1904 - Progressing by Rise Mu, RN Will not experience complications related to urinary  retention 06/11/2017 1904 - Progressing by Rise Mu, RN Skin Integrity: Risk for impaired skin integrity will decrease 06/11/2017 1904 - Progressing by Rise Mu, RN

## 2017-06-12 MED ORDER — OLANZAPINE 10 MG PO TABS
10.0000 mg | ORAL_TABLET | Freq: Every day | ORAL | Status: DC
  Administered 2017-06-12 – 2017-06-15 (×4): 10 mg via ORAL
  Filled 2017-06-12 (×4): qty 1

## 2017-06-12 NOTE — Plan of Care (Signed)
Patient is stable and responding well to treatment without any noticeable side effects. Patient is eating well and sleeping long hours. Patient contract for safety of self and others and denies any thought of SI/HI and no signs of AVH, 15 minute safety round in progress. Progressing Anxiety Demonstrates healthy coping skills 06/12/2017 2028 - Progressing by Clemens Catholic, RN Activity: Interest or engagement in activities will improve 06/12/2017 2028 - Progressing by Clemens Catholic, RN Sleeping patterns will improve 06/12/2017 2028 - Progressing by Clemens Catholic, RN Sleeping patterns will improve 06/12/2017 2028 - Progressing by Clemens Catholic, RN Education: Knowledge of [place] Education information/materials will improve 06/12/2017 2028 - Progressing by Clemens Catholic, RN Mental status will improve 06/12/2017 2028 - Progressing by Clemens Catholic, RN Coping: Ability to verbalize frustrations and anger appropriately will improve 06/12/2017 2028 - Progressing by Clemens Catholic, RN Ability to demonstrate self-control will improve 06/12/2017 2028 - Progressing by Clemens Catholic, RN Health Behavior/Discharge Planning: Ability to manage health-related needs will improve 06/12/2017 2028 - Progressing by Clemens Catholic, RN Clinical Measurements: Will remain free from infection 06/12/2017 2028 - Progressing by Clemens Catholic, RN Diagnostic test results will improve 06/12/2017 2028 - Progressing by Clemens Catholic, RN Respiratory complications will improve 06/12/2017 2028 - Progressing by Clemens Catholic, RN Nutrition: Adequate nutrition will be maintained 06/12/2017 2028 - Progressing by Clemens Catholic, RN Coping: Level of anxiety will decrease 06/12/2017 2028 - Progressing by Clemens Catholic, RN Safety: Ability to remain free from injury will improve 06/12/2017 2028 - Progressing by Clemens Catholic, RN

## 2017-06-12 NOTE — [facility] Group Notes (Signed)
06/12/2017  Time: 1:00PM   Type of Therapy/Topic:  Group Therapy:  Balance in Life  Participation Level:  Did Not Attend  Description of Group:   This group will address the concept of balance and how it feels and looks when one is unbalanced. Patients will be encouraged to process areas in their lives that are out of balance and identify reasons for remaining unbalanced. Facilitators will guide patients in utilizing problem-solving interventions to address and correct the stressor making their life unbalanced. Understanding and applying boundaries will be explored and addressed for obtaining and maintaining a balanced life. Patients will be encouraged to explore ways to assertively make their unbalanced needs known to significant others in their lives, using other group members and facilitator for support and feedback.  Therapeutic Goals: 1. Patient will identify two or more emotions or situations they have that consume much of in their lives. 2. Patient will identify signs/triggers that life has become out of balance:  3. Patient will identify two ways to set boundaries in order to achieve balance in their lives:  4. Patient will demonstrate ability to communicate their needs through discussion and/or role plays  Summary of Patient Progress: Pt was invited to attend group but chose not to attend. CSW will continue to encourage pt to attend group throughout their admission.    Therapeutic Modalities:   Cognitive Behavioral Therapy Solution-Focused Therapy Assertiveness Training  Alden Hipp, MSW, LCSW 06/12/2017 2:05 PM

## 2017-06-12 NOTE — Progress Notes (Addendum)
[facility] MD Progress Note  06/12/2017 4:34 PM [place].  MRN:  [id number]  Subjective:   Mr. Cueto feels better. Auditory hallucinations are improving. He denies any mood symptoms. Sleep at night is interrupted but he sleeps a lot during the day. There are no somatic complaints. Accepts and tolerates medications well.   Treatment plan. We will continue Clozapine titration and Zyprexa taper. Continue Invega sustenna injections and depakote for psychosis and mood stabilization.  Social/disposition. Discharge to home with family. Follow up with [facility].  Principal Problem: Schizoaffective disorder, bipolar type without good prognostic features ([place]) Diagnosis:   Patient Active Problem List   Diagnosis Date Noted  . Schizoaffective disorder, bipolar type without good prognostic features ([place]) [F25.0] 06/07/2017    Priority: High  . Tobacco use disorder [F17.200] 06/08/2017  . GERD (gastroesophageal reflux disease) [K21.9] 06/08/2017  . Cannabis abuse [F12.10]   . Dysphagia [R13.10] 05/28/2012  . Abdominal pain, chronic, epigastric [R10.13, G89.29] 05/28/2012  . Gallstones [K80.20] 08/06/2011  . Pancreatitis [K85.90] 08/06/2011  . Cannabis use disorder, moderate, dependence ([place]) [F12.20] 06/10/2011  . Schizophrenia, paranoid, chronic ([place]) [F20.0] 06/08/2011    Class: Acute   Total Time spent with patient: 30 minutes  Past Psychiatric History: schizophrenia  Past Medical History:  Past Medical History:  Diagnosis Date  . Bipolar 1 disorder ([place])   . Gallstones   . Pancreatitis    questionable mild biliary pancreatitis  . Schizophrenia, schizo-affective ([place])     Past Surgical History:  Procedure Laterality Date  . APPENDECTOMY    . CHOLECYSTECTOMY  02/28/2012   Procedure: LAPAROSCOPIC CHOLECYSTECTOMY;  Surgeon: Jamesetta So, MD;  Location: [facility];  Service: General;  Laterality: N/A;  . ESOPHAGOGASTRODUODENOSCOPY  07/11/2006   RMR: Esophageal foreign body (chicken  bone), status post removal, as described above.  Complete esophagogastroduodenoscopy not carried out  . MULTIPLE TOOTH EXTRACTIONS     due to decay, nerve damage, edentulous   Family History:  Family History  Problem Relation Age of Onset  . Aneurysm Mother        brain  . Liver disease Mother   . Colon cancer Maternal Uncle        great uncle  . Pancreatitis Maternal Uncle        died age 80  . Stomach cancer Maternal Aunt        great aunt   Family Psychiatric  History: bipolar Social History:  Social History   Substance and Sexual Activity  Alcohol Use No   Comment: former quit about 10 years ago     Social History   Substance and Sexual Activity  Drug Use Yes  . Types: Marijuana   Comment: marijuana end of Jan 2014     Social History   Socioeconomic History  . Marital status: Single    Spouse name: None  . Number of children: None  . Years of education: None  . Highest education level: None  Social Needs  . Financial resource strain: None  . Food insecurity - worry: None  . Food insecurity - inability: None  . Transportation needs - medical: None  . Transportation needs - non-medical: None  Occupational History  . None  Tobacco Use  . Smoking status: Current Every Day Smoker    Packs/day: 1.50    Years: 20.00    Pack years: 30.00    Types: Cigarettes  . Smokeless tobacco: Never Used  Substance and Sexual Activity  . Alcohol use: No  Comment: former quit about 10 years ago  . Drug use: Yes    Types: Marijuana    Comment: marijuana end of Jan 2014   . Sexual activity: Yes  Other Topics Concern  . None  Social History Narrative  . None   Additional Social History:                         Sleep: Poor  Appetite:  Fair  Current Medications: Current Facility-Administered Medications  Medication Dose Route Frequency Provider Last Rate Last Dose  . acetaminophen (TYLENOL) tablet 650 mg  650 mg Oral Q6H PRN Chauncey Mann, MD      .  alum & mag hydroxide-simeth (MAALOX/MYLANTA) 200-200-20 MG/5ML suspension 30 mL  30 mL Oral Q4H PRN Chauncey Mann, MD      . cloZAPine (CLOZARIL) tablet 100 mg  100 mg Oral QHS [place] Zaragoza B, MD   100 mg at 06/11/17 2127  . divalproex (DEPAKOTE ER) 24 hr tablet 1,000 mg  1,000 mg Oral QHS Chauncey Mann, MD   1,000 mg at 06/11/17 2127  . docusate sodium (COLACE) capsule 100 mg  100 mg Oral Daily Kehinde Bowdish B, MD   100 mg at 06/12/17 0848  . feeding supplement (ENSURE ENLIVE) (ENSURE ENLIVE) liquid 237 mL  237 mL Oral TID BM Yeila Morro B, MD   237 mL at 06/12/17 0936  . magic mouthwash  5 mL Oral TID Chauncey Mann, MD   5 mL at 06/12/17 0849  . magnesium hydroxide (MILK OF MAGNESIA) suspension 30 mL  30 mL Oral Daily PRN Chauncey Mann, MD      . mirtazapine (REMERON) tablet 7.5 mg  7.5 mg Oral QHS Chauncey Mann, MD   7.5 mg at 06/11/17 2128  . nicotine (NICODERM CQ - dosed in mg/24 hours) patch 14 mg  14 mg Transdermal Daily Chauncey Mann, MD   14 mg at 06/12/17 0849  . OLANZapine (ZYPREXA) tablet 15 mg  15 mg Oral QHS Chauncey Mann, MD   15 mg at 06/11/17 2126  . pantoprazole (PROTONIX) EC tablet 40 mg  40 mg Oral Daily Chauncey Mann, MD   40 mg at 06/12/17 0848  . polyethylene glycol (MIRALAX / GLYCOLAX) packet 17 g  17 g Oral Daily Jowell Bossi B, MD   17 g at 06/12/17 0850  . propranolol (INDERAL) tablet 10 mg  10 mg Oral TID Chauncey Mann, MD   10 mg at 06/12/17 0847    Lab Results: No results found for this or any previous visit (from the past 48 hour(s)).  Blood Alcohol level:  Lab Results  Component Value Date   [facility] <10 06/06/2017   ETH <11 40/98/1191    Metabolic Disorder Labs: Lab Results  Component Value Date   HGBA1C 4.6 (L) 06/08/2017   MPG 85.32 06/08/2017   MPG 108 06/11/2011   Lab Results  Component Value Date   PROLACTIN 48.5 (H) 06/08/2017   Lab Results  Component Value Date   CHOL 156 06/08/2017   TRIG 252 (H) 06/08/2017    HDL 31 (L) 06/08/2017   CHOLHDL 5.0 06/08/2017   VLDL 50 (H) 06/08/2017   LDLCALC 75 06/08/2017   LDLCALC 63 06/11/2011    Physical Findings: AIMS:  , ,  ,  ,    CIWA:    COWS:     Musculoskeletal: Strength & Muscle Tone: within normal limits  Gait & Station: normal Patient leans: N/A  Psychiatric Specialty Exam: Physical Exam  Nursing note and vitals reviewed. Psychiatric: His speech is normal. Thought content normal. His affect is blunt. He is withdrawn and actively hallucinating. Cognition and memory are normal. He expresses impulsivity.    Review of Systems  Neurological: Negative.   Psychiatric/Behavioral: Negative.   All other systems reviewed and are negative.   Blood pressure 129/86, pulse 99, temperature 98.3 F (36.8 C), temperature source Oral, resp. rate 18, height 5\' 11"  (1.803 m), weight 68.2 kg (150 lb 4 oz), SpO2 99 %.Body mass index is 20.96 kg/m.  General Appearance: Casual  Eye Contact:  Good  Speech:  Clear and Coherent  Volume:  Normal  Mood:  Euthymic  Affect:  Blunt  Thought Process:  Goal Directed and Descriptions of Associations: Intact  Orientation:  Full (Time, Place, and Person)  Thought Content:  Hallucinations: Auditory  Suicidal Thoughts:  No  Homicidal Thoughts:  No  Memory:  Immediate;   Fair Recent;   Fair Remote;   Fair  Judgement:  Impaired  Insight:  Shallow  Psychomotor Activity:  Normal  Concentration:  Concentration: Fair and Attention Span: Fair  Recall:  [employer] of Knowledge:  Fair  Language:  Fair  Akathisia:  No  Handed:  Right  AIMS (if indicated):     Assets:  Communication Skills Desire for Improvement Financial Resources/Insurance Housing Physical Health Resilience Social Support  ADL's:  Intact  Cognition:  WNL  Sleep:  Number of Hours: 8     Treatment Plan Summary: Daily contact with patient to assess and evaluate symptoms and progress in treatment and Medication management   Mr. Apo is a  42 year old male with a diagnosis of schizoaffective disorder who came to the emergency room under IVC endorsing suicidal and homicidal thoughts in the context of active psychotic symptoms. He attempted to buy a gun illegally prior to admission.   #Schizoaffective disorder bipolar type -discontinue Seroquel -lower Zyprexa from 15 to 10 mg nightly   -continue Depakote ER 1000 mg nightly  -continue [place] monthly injections, last injection given 05/30/17 -continue Propanolol 10mg  po TIDto help with akathesia -today continue Clozapine 100 mg nightly, increase to 150 mg tomorrow  #Cannabis use disorder -patient was advised to abstain from marijuana and all illicit drugs as they may worsen mood symptoms. -at this time, he is not interested in substance abuse treatment  #Nicotine use disorder -nicotine patch is available - he was advised to stop smoking completely as it may worsen his health  #Poor oral intake -poor dentition, soft diet -Ensure  #Metabolic syndrome monitoring -lipid panel shows elevated TG, TSH is 5.3, Normal HgbA1C -EKG, QTc431  #Admission status -voluntary admission  #Disposition -discharge to home with the mother -follow up with Dr. Isabelle Course.     Orson Slick, MD 06/12/2017, 4:34 PM

## 2017-06-12 NOTE — Plan of Care (Addendum)
Patient is alert and oriented X 4. Patient denies SI, HI and states AVH are much better today. Patient is pleasant and cooperative. Patient was able to sleep 7 hours last night. Patient is compliant with medications; usually attends groups, but did not attend groups today. Patient isolated himself in his room only coming out for meals today. Patient states he feels much better than when he was admitted, but sleepy today. Patient has no other concerns at this time. Nurse will continue to monitor. 15 minute safety checks will continue. Activity: Interest or engagement in activities will improve 06/12/2017 1046 - Progressing by Geraldo Docker, RN Sleeping patterns will improve 06/12/2017 1046 - Progressing by Geraldo Docker, RN Sleeping patterns will improve 06/12/2017 1046 - Progressing by Geraldo Docker, RN   Education: Knowledge of [place] Education information/materials will improve 06/12/2017 1046 - Progressing by Geraldo Docker, RN Mental status will improve 06/12/2017 1046 - Progressing by Geraldo Docker, RN   Coping: Ability to verbalize frustrations and anger appropriately will improve 06/12/2017 1046 - Progressing by Geraldo Docker, RN Ability to demonstrate self-control will improve 06/12/2017 1046 - Progressing by Geraldo Docker, RN

## 2017-06-12 NOTE — [facility] Group Notes (Signed)
  06/12/2017 9am  Type of Therapy and Topic: Group Therapy: Goals Group: SMART Goals   Participation Level: Active  Description of Group:    The purpose of a daily goals group is to assist and guide patients in setting recovery/wellness-related goals. The objective is to set goals as they relate to the crisis in which they were admitted. Patients will be using SMART goal modalities to set measurable goals. Characteristics of realistic goals will be discussed and patients will be assisted in setting and processing how one will reach their goal. Facilitator will also assist patients in applying interventions and coping skills learned in psycho-education groups to the SMART goal and process how one will achieve defined goal.   Therapeutic Goals:   -Patients will develop and document one goal related to or their crisis in which brought them into treatment.  -Patients will be guided by LCSW using SMART goal setting modality in how to set a measurable, attainable, realistic and time sensitive goal.  -Patients will process barriers in reaching goal.  -Patients will process interventions in how to overcome and successful in reaching goal.   Patient's Goal: " To work on getting discharged soon by talking to my doctor today."  Therapeutic Modalities:  Motivational Interviewing  Public relations account executive Therapy  Crisis Intervention Model  SMART goals setting  Darin Engels, [place] 06/12/2017 9:50 AM

## 2017-06-13 MED ORDER — CLOZAPINE 100 MG PO TABS
200.0000 mg | ORAL_TABLET | Freq: Every day | ORAL | Status: DC
  Administered 2017-06-15 – 2017-06-16 (×2): 200 mg via ORAL
  Filled 2017-06-13 (×2): qty 2

## 2017-06-13 MED ORDER — CLOZAPINE 100 MG PO TABS: 200.0000 mg | ORAL_TABLET | Freq: Every day | ORAL | Status: DC

## 2017-06-13 MED ORDER — CLOZAPINE 25 MG PO TABS
150.0000 mg | ORAL_TABLET | Freq: Every day | ORAL | Status: AC
  Administered 2017-06-13 – 2017-06-14 (×2): 150 mg via ORAL
  Filled 2017-06-13 (×2): qty 1

## 2017-06-13 MED ORDER — TRAZODONE HCL 100 MG PO TABS
100.0000 mg | ORAL_TABLET | Freq: Every day | ORAL | Status: DC
  Administered 2017-06-13 – 2017-06-15 (×3): 100 mg via ORAL
  Filled 2017-06-13 (×3): qty 1

## 2017-06-13 NOTE — Tx Team (Signed)
Interdisciplinary Treatment and Diagnostic Plan Update  06/13/2017 Time of Session: 10:40 AM Isaiah [place]. MRN: [id number]  Principal Diagnosis: Schizoaffective disorder, bipolar type without good prognostic features ([place])  Secondary Diagnoses: Principal Problem:   Schizoaffective disorder, bipolar type without good prognostic features ([place]) Active Problems:   Cannabis use disorder, moderate, dependence ([facility])   Tobacco use disorder   GERD (gastroesophageal reflux disease)   Cannabis abuse   Current Medications:  Current Facility-Administered Medications  Medication Dose Route Frequency Provider Last Rate Last Dose  . acetaminophen (TYLENOL) tablet 650 mg  650 mg Oral Q6H PRN Chauncey Mann, MD      . alum & mag hydroxide-simeth (MAALOX/MYLANTA) 200-200-20 MG/5ML suspension 30 mL  30 mL Oral Q4H PRN Chauncey Mann, MD      . cloZAPine (CLOZARIL) tablet 150 mg  150 mg Oral QHS Pucilowska, Jolanta B, MD      . Derrill Memo ON 06/15/2017] cloZAPine (CLOZARIL) tablet 200 mg  200 mg Oral QHS Pucilowska, Jolanta B, MD      . divalproex (DEPAKOTE ER) 24 hr tablet 1,000 mg  1,000 mg Oral QHS Chauncey Mann, MD   1,000 mg at 06/12/17 2119  . docusate sodium (COLACE) capsule 100 mg  100 mg Oral Daily Pucilowska, Jolanta B, MD   100 mg at 06/13/17 0824  . feeding supplement (ENSURE ENLIVE) (ENSURE ENLIVE) liquid 237 mL  237 mL Oral TID BM Pucilowska, Jolanta B, MD   237 mL at 06/13/17 1405  . magic mouthwash  5 mL Oral TID Chauncey Mann, MD   5 mL at 06/13/17 1142  . magnesium hydroxide (MILK OF MAGNESIA) suspension 30 mL  30 mL Oral Daily PRN Chauncey Mann, MD      . nicotine (NICODERM CQ - dosed in mg/24 hours) patch 14 mg  14 mg Transdermal Daily Chauncey Mann, MD   14 mg at 06/13/17 0824  . OLANZapine (ZYPREXA) tablet 10 mg  10 mg Oral QHS Pucilowska, Jolanta B, MD   10 mg at 06/12/17 2119  . pantoprazole (PROTONIX) EC tablet 40 mg  40 mg Oral Daily Chauncey Mann, MD   40 mg at 06/13/17  0824  . polyethylene glycol (MIRALAX / GLYCOLAX) packet 17 g  17 g Oral Daily Pucilowska, Jolanta B, MD   17 g at 06/13/17 0823  . propranolol (INDERAL) tablet 10 mg  10 mg Oral TID Chauncey Mann, MD   10 mg at 06/13/17 1142  . traZODone (DESYREL) tablet 100 mg  100 mg Oral QHS Pucilowska, Jolanta B, MD       PTA Medications: Medications Prior to Admission  Medication Sig Dispense Refill Last Dose  . Alum & Mag Hydroxide-Simeth (MAGIC MOUTHWASH) SOLN Take 5 mLs by mouth 3 (three) times daily. (Patient not taking: Reported on 06/06/2017) 120 mL 0 Not Taking at Unknown time  . divalproex (DEPAKOTE ER) 500 MG 24 hr tablet Take 1,000 mg by mouth at bedtime.    06/05/2017 at Unknown time  . HYDROcodone-acetaminophen (NORCO/VICODIN) 5-325 MG per tablet Take 1 tablet by mouth every 6 (six) hours as needed for pain. (Patient not taking: Reported on 06/06/2017) 20 tablet 0 Not Taking at Unknown time  . methocarbamol (ROBAXIN) 500 MG tablet Take 1 tablet (500 mg total) by mouth 4 (four) times daily. (Patient not taking: Reported on 06/06/2017) 30 tablet 0 Not Taking at Unknown time  . mirtazapine (REMERON) 7.5 MG tablet Take 1 tablet by mouth at  bedtime.  1 06/05/2017 at Unknown time  . omeprazole (PRILOSEC) 20 MG capsule Take 1 capsule (20 mg total) by mouth daily. (Patient not taking: Reported on 06/06/2017) 30 capsule 0 Not Taking at Unknown time  . paliperidone (INVEGA SUSTENNA) 156 MG/ML SUSP injection Inject 1 mL into the muscle every 30 (thirty) days.    05/30/2017  . predniSONE (DELTASONE) 20 MG tablet 3 Tabs PO Days 1-3, then 2 tabs PO Days 4-6, then 1 tab PO Day 7-9, then Half Tab PO Day 10-12 (Patient not taking: Reported on 06/06/2017) 20 tablet 0 Not Taking at Unknown time  . propranolol (INDERAL) 80 MG tablet Take by mouth at bedtime.    06/05/2017 at Unknown time  . QUEtiapine (SEROQUEL) 400 MG tablet Take 400 mg by mouth at bedtime.    06/05/2017 at Unknown time    Patient Stressors: Marital or family  conflict Other: auditory hallucinations  Patient Strengths: Ability for insight Armed forces logistics/support/administrative officer Motivation for treatment/growth  Treatment Modalities: Medication Management, Group therapy, Case management,  1 to 1 session with clinician, Psychoeducation, Recreational therapy.   Physician Treatment Plan for Primary Diagnosis: Schizoaffective disorder, bipolar type without good prognostic features ([place]) Long Term Goal(s): Improvement in symptoms so as ready for discharge Improvement in symptoms so as ready for discharge   Short Term Goals: Ability to identify changes in lifestyle to reduce recurrence of condition will improve Ability to verbalize feelings will improve Ability to disclose and discuss suicidal ideas Ability to demonstrate self-control will improve Ability to identify and develop effective coping behaviors will improve Ability to identify triggers associated with substance abuse/mental health issues will improve Ability to identify changes in lifestyle to reduce recurrence of condition will improve Ability to verbalize feelings will improve Ability to disclose and discuss suicidal ideas Ability to demonstrate self-control will improve Ability to identify and develop effective coping behaviors will improve Ability to identify triggers associated with substance abuse/mental health issues will improve  Medication Management: Evaluate patient's response, side effects, and tolerance of medication regimen.  Therapeutic Interventions: 1 to 1 sessions, Unit Group sessions and Medication administration.  Evaluation of Outcomes: Progressing  Physician Treatment Plan for Secondary Diagnosis: Principal Problem:   Schizoaffective disorder, bipolar type without good prognostic features ([place]) Active Problems:   Cannabis use disorder, moderate, dependence ([facility])   Tobacco use disorder   GERD (gastroesophageal reflux disease)   Cannabis abuse  Long Term Goal(s): Improvement  in symptoms so as ready for discharge Improvement in symptoms so as ready for discharge   Short Term Goals: Ability to identify changes in lifestyle to reduce recurrence of condition will improve Ability to verbalize feelings will improve Ability to disclose and discuss suicidal ideas Ability to demonstrate self-control will improve Ability to identify and develop effective coping behaviors will improve Ability to identify triggers associated with substance abuse/mental health issues will improve Ability to identify changes in lifestyle to reduce recurrence of condition will improve Ability to verbalize feelings will improve Ability to disclose and discuss suicidal ideas Ability to demonstrate self-control will improve Ability to identify and develop effective coping behaviors will improve Ability to identify triggers associated with substance abuse/mental health issues will improve     Medication Management: Evaluate patient's response, side effects, and tolerance of medication regimen.  Therapeutic Interventions: 1 to 1 sessions, Unit Group sessions and Medication administration.  Evaluation of Outcomes: Progressing   RN Treatment Plan for Primary Diagnosis: Schizoaffective disorder, bipolar type without good prognostic features ([place]) Long Term Goal(s):  Knowledge of disease and therapeutic regimen to maintain health will improve  Short Term Goals: Ability to verbalize frustration and anger appropriately will improve, Ability to identify and develop effective coping behaviors will improve and Compliance with prescribed medications will improve  Medication Management: RN will administer medications as ordered by provider, will assess and evaluate patient's response and provide education to patient for prescribed medication. RN will report any adverse and/or side effects to prescribing provider.  Therapeutic Interventions: 1 on 1 counseling sessions, Psychoeducation, Medication  administration, Evaluate responses to treatment, Monitor vital signs and CBGs as ordered, Perform/monitor CIWA, COWS, AIMS and Fall Risk screenings as ordered, Perform wound care treatments as ordered.  Evaluation of Outcomes: Progressing   LCSW Treatment Plan for Primary Diagnosis: Schizoaffective disorder, bipolar type without good prognostic features ([place]) Long Term Goal(s): Safe transition to appropriate next level of care at discharge, Engage patient in therapeutic group addressing interpersonal concerns.  Short Term Goals: Engage patient in aftercare planning with referrals and resources, Identify triggers associated with mental health/substance abuse issues and Increase skills for wellness and recovery  Therapeutic Interventions: Assess for all discharge needs, 1 to 1 time with Social worker, Explore available resources and support systems, Assess for adequacy in community support network, Educate family and significant other(s) on suicide prevention, Complete Psychosocial Assessment, Interpersonal group therapy.  Evaluation of Outcomes: Progressing   Progress in Treatment: Attending groups: Yes. Participating in groups: Yes. Taking medication as prescribed: Yes. Toleration medication: Yes. Family/Significant other contact made: No, will contact:  Pt has refused to consent to contacting support/family. Patient understands diagnosis: Yes. Discussing patient identified problems/goals with staff: Yes. Medical problems stabilized or resolved: Yes. Denies suicidal/homicidal ideation: Yes. Issues/concerns per patient self-inventory: Yes. Other:    New problem(s) identified: No, Describe:     New Short Term/Long Term Goal(s): Try new medications as Hallucinations have worsened even with medication compliance.  Discharge Plan or Barriers: Discharge plan remains home with follow up at  [place]  Reason for Continuation of Hospitalization: Hallucinations Medication  stabilization  Estimated Length of Stay: 3-5 days  Attendees: Patient: 06/13/2017 2:35 PM  Physician: Orson Slick 06/13/2017 2:35 PM  Nursing: Eugenio Hoes, RN 06/13/2017 2:35 PM  RN Care Manager: 06/13/2017 2:35 PM  Social Worker: Derrek Gu, LCSW 06/13/2017 2:35 PM  Recreational Therapist:  06/13/2017 2:35 PM  Other:  06/13/2017 2:35 PM  Other:  06/13/2017 2:35 PM  Other: 06/13/2017 2:35 PM    Scribe for Treatment Team: Devona Konig, LCSW 06/13/2017 2:35 PM

## 2017-06-13 NOTE — Progress Notes (Addendum)
[facility] MD Progress Note  06/13/2017 12:30 PM [place].  MRN:  [id number]  Subjective:  Mr. Maggart has no complaints. He accepts medications and tolerates them well. No side effects or somatic complaints. Reports good sleep but slept 4 hours only. Appetite is good. He hardly lives his room except for meals. He has persistent AH and is really hopeful to get better with Clozapine.  Treatment plan. We continue Depakote and Clozapine titration. Will get 150 mg Fri and Sat, 200 mg on Fri. We are tapering Zyprexa off.   Social/disposition. He lives with his mother, follow up with [facility].  Principal Problem: Schizoaffective disorder, bipolar type without good prognostic features ([place]) Diagnosis:   Patient Active Problem List   Diagnosis Date Noted  . Schizoaffective disorder, bipolar type without good prognostic features ([place]) [F25.0] 06/07/2017    Priority: High  . Tobacco use disorder [F17.200] 06/08/2017  . GERD (gastroesophageal reflux disease) [K21.9] 06/08/2017  . Cannabis abuse [F12.10]   . Dysphagia [R13.10] 05/28/2012  . Abdominal pain, chronic, epigastric [R10.13, G89.29] 05/28/2012  . Gallstones [K80.20] 08/06/2011  . Pancreatitis [K85.90] 08/06/2011  . Cannabis use disorder, moderate, dependence ([place]) [F12.20] 06/10/2011  . Schizophrenia, paranoid, chronic ([place]) [F20.0] 06/08/2011    Class: Acute   Total Time spent with patient: 30 minutes  Past Psychiatric History: schizophrenia.  Past Medical History:  Past Medical History:  Diagnosis Date  . Bipolar 1 disorder ([place])   . Gallstones   . Pancreatitis    questionable mild biliary pancreatitis  . Schizophrenia, schizo-affective ([place])     Past Surgical History:  Procedure Laterality Date  . APPENDECTOMY    . CHOLECYSTECTOMY  02/28/2012   Procedure: LAPAROSCOPIC CHOLECYSTECTOMY;  Surgeon: Jamesetta So, MD;  Location: [facility];  Service: General;  Laterality: N/A;  . ESOPHAGOGASTRODUODENOSCOPY  07/11/2006   RMR:  Esophageal foreign body (chicken bone), status post removal, as described above.  Complete esophagogastroduodenoscopy not carried out  . MULTIPLE TOOTH EXTRACTIONS     due to decay, nerve damage, edentulous   Family History:  Family History  Problem Relation Age of Onset  . Aneurysm Mother        brain  . Liver disease Mother   . Colon cancer Maternal Uncle        great uncle  . Pancreatitis Maternal Uncle        died age 7  . Stomach cancer Maternal Aunt        great aunt   Family Psychiatric  History: bipolar Social History:  Social History   Substance and Sexual Activity  Alcohol Use No   Comment: former quit about 10 years ago     Social History   Substance and Sexual Activity  Drug Use Yes  . Types: Marijuana   Comment: marijuana end of Jan 2014     Social History   Socioeconomic History  . Marital status: Single    Spouse name: None  . Number of children: None  . Years of education: None  . Highest education level: None  Social Needs  . Financial resource strain: None  . Food insecurity - worry: None  . Food insecurity - inability: None  . Transportation needs - medical: None  . Transportation needs - non-medical: None  Occupational History  . None  Tobacco Use  . Smoking status: Current Every Day Smoker    Packs/day: 1.50    Years: 20.00    Pack years: 30.00    Types: Cigarettes  .  Smokeless tobacco: Never Used  Substance and Sexual Activity  . Alcohol use: No    Comment: former quit about 10 years ago  . Drug use: Yes    Types: Marijuana    Comment: marijuana end of Jan 2014   . Sexual activity: Yes  Other Topics Concern  . None  Social History Narrative  . None   Additional Social History:                         Sleep: Fair  Appetite:  Fair  Current Medications: Current Facility-Administered Medications  Medication Dose Route Frequency Provider Last Rate Last Dose  . acetaminophen (TYLENOL) tablet 650 mg  650 mg Oral  Q6H PRN Chauncey Mann, MD      . alum & mag hydroxide-simeth (MAALOX/MYLANTA) 200-200-20 MG/5ML suspension 30 mL  30 mL Oral Q4H PRN Chauncey Mann, MD      . cloZAPine (CLOZARIL) tablet 150 mg  150 mg Oral QHS Hebert Dooling B, MD      . Derrill Memo ON 06/15/2017] cloZAPine (CLOZARIL) tablet 200 mg  200 mg Oral QHS Corissa Oguinn B, MD      . divalproex (DEPAKOTE ER) 24 hr tablet 1,000 mg  1,000 mg Oral QHS Chauncey Mann, MD   1,000 mg at 06/12/17 2119  . docusate sodium (COLACE) capsule 100 mg  100 mg Oral Daily Azeez Dunker B, MD   100 mg at 06/13/17 0824  . feeding supplement (ENSURE ENLIVE) (ENSURE ENLIVE) liquid 237 mL  237 mL Oral TID BM Karima Carrell B, MD   237 mL at 06/12/17 2120  . magic mouthwash  5 mL Oral TID Chauncey Mann, MD   5 mL at 06/13/17 1142  . magnesium hydroxide (MILK OF MAGNESIA) suspension 30 mL  30 mL Oral Daily PRN Chauncey Mann, MD      . mirtazapine (REMERON) tablet 7.5 mg  7.5 mg Oral QHS Chauncey Mann, MD   7.5 mg at 06/12/17 2119  . nicotine (NICODERM CQ - dosed in mg/24 hours) patch 14 mg  14 mg Transdermal Daily Chauncey Mann, MD   14 mg at 06/13/17 0824  . OLANZapine (ZYPREXA) tablet 10 mg  10 mg Oral QHS Keyly Baldonado B, MD   10 mg at 06/12/17 2119  . pantoprazole (PROTONIX) EC tablet 40 mg  40 mg Oral Daily Chauncey Mann, MD   40 mg at 06/13/17 0824  . polyethylene glycol (MIRALAX / GLYCOLAX) packet 17 g  17 g Oral Daily Sarrinah Gardin B, MD   17 g at 06/13/17 0823  . propranolol (INDERAL) tablet 10 mg  10 mg Oral TID Chauncey Mann, MD   10 mg at 06/13/17 1142    Lab Results: No results found for this or any previous visit (from the past 48 hour(s)).  Blood Alcohol level:  Lab Results  Component Value Date   [facility] <10 06/06/2017   ETH <11 36/64/4034    Metabolic Disorder Labs: Lab Results  Component Value Date   HGBA1C 4.6 (L) 06/08/2017   MPG 85.32 06/08/2017   MPG 108 06/11/2011   Lab Results  Component Value  Date   PROLACTIN 48.5 (H) 06/08/2017   Lab Results  Component Value Date   CHOL 156 06/08/2017   TRIG 252 (H) 06/08/2017   HDL 31 (L) 06/08/2017   CHOLHDL 5.0 06/08/2017   VLDL 50 (H) 06/08/2017   LDLCALC 75 06/08/2017  [place] 63 06/11/2011    Physical Findings: AIMS:  , ,  ,  ,    CIWA:    COWS:     Musculoskeletal: Strength & Muscle Tone: within normal limits Gait & Station: normal Patient leans: N/A  Psychiatric Specialty Exam: Physical Exam  Nursing note and vitals reviewed. Psychiatric: His speech is normal. Judgment normal. His affect is blunt. He is withdrawn and actively hallucinating. Cognition and memory are normal.    Review of Systems  Neurological: Negative.   Psychiatric/Behavioral: Positive for hallucinations.  All other systems reviewed and are negative.   Blood pressure (!) 148/112, pulse (!) 111, temperature (!) 97.4 F (36.3 C), temperature source Oral, resp. rate 18, height 5\' 11"  (1.803 m), weight 68.2 kg (150 lb 4 oz), SpO2 99 %.Body mass index is 20.96 kg/m.  General Appearance: Casual  Eye Contact:  Fair  Speech:  Clear and Coherent  Volume:  Normal  Mood:  Euthymic  Affect:  Blunt  Thought Process:  Goal Directed and Descriptions of Associations: Intact  Orientation:  Full (Time, Place, and Person)  Thought Content:  Hallucinations: Auditory  Suicidal Thoughts:  No  Homicidal Thoughts:  No  Memory:  Immediate;   Fair Recent;   Fair Remote;   Fair  Judgement:  Fair  Insight:  Shallow  Psychomotor Activity:  Decreased  Concentration:  Concentration: Fair and Attention Span: Fair  Recall:  [employer] of Knowledge:  Fair  Language:  Fair  Akathisia:  No  Handed:  Right  AIMS (if indicated):     Assets:  Communication Skills Desire for Improvement Financial Resources/Insurance Housing Physical Health Resilience Social Support  ADL's:  Intact  Cognition:  WNL  Sleep:  Number of Hours: 4.45     Treatment Plan  Summary: Daily contact with patient to assess and evaluate symptoms and progress in treatment and Medication management   Isaiah Peterson is a 42 year old male with a diagnosis of schizoaffective disorder who came to the emergency room under IVC endorsing suicidal and homicidal thoughts in the context of active psychotic symptoms. He attempted to buy a gun illegally prior to admission.   #Schizoaffective disorder bipolar type -discontinue Seroquel -lower Zyprexa from 15 to 10 mg nightly   -continue Depakote ER 1000 mg nightly VPA level 83 on 2/4 -continue [place] monthly injections, last injection given 05/30/17 -continue Propanolol 10 mg po TIDto help with akathesia -increase Clozapine to 150 mg nightly for 2 days  #Insomnia -discontinue Remeron -start Trazodone 100 mg nightly  #Cannabis use disorder -patient was advised to abstain from marijuana and all illicit drugs as they may worsen mood symptoms. -at this time, he is not interested in substance abuse treatment  #Nicotine use disorder -nicotine patch is available - he was advised to stop smoking completely as it may worsen his health  #Poor oral intake -poor dentition, soft diet -Ensure  #Metabolic syndrome monitoring -lipid panel shows elevated TG, TSH is 5.3, Normal HgbA1C -EKG, QTc431  #Admission status -voluntary admission  #Disposition -discharge to home with the mother -follow up with Dr. Isabelle Course.    Orson Slick, MD 06/13/2017, 12:30 PM

## 2017-06-13 NOTE — Progress Notes (Signed)
Patient in bed upon arrival to the unit. Patient up for meals and is compliant with medications. Remains isolative to his room, denies SI/HI/AVH but reluctantly stated that he was not having any auditory hallucinations. Nurse will continue to talk with patient until he feels comfortable to endorse any active hallucinations. [place] remains safe.

## 2017-06-13 NOTE — Plan of Care (Signed)
Patient verbalizing understanding of  information received but will need to continue to be educated. No evidence of use of coping skills. No safety concerns voiced . Less anxiety

## 2017-06-13 NOTE — Progress Notes (Signed)
D: Pt denies SI/HI/AVH. Pt is pleasant and cooperative. Pt stated he was doing better due to his medications and the voices are less.   A: Pt was offered support and encouragement. Pt was given scheduled medications. Pt was encourage to attend groups. Q 15 minute checks were done for safety.   R:Pt attends groups and interacts well with peers and staff. Pt is taking medication. Pt has no complaints.Pt receptive to treatment and safety maintained on unit.

## 2017-06-13 NOTE — [facility] Group Notes (Signed)
LCSW Group Therapy Note  06/13/2017 1:00pm  Type of Therapy and Topic:  Group Therapy:  Feelings around Relapse and Recovery  Participation Level:  Did Not Attend   Description of Group:    Patients in this group will discuss emotions they experience before and after a relapse. They will process how experiencing these feelings, or avoidance of experiencing them, relates to having a relapse. Facilitator will guide patients to explore emotions they have related to recovery. Patients will be encouraged to process which emotions are more powerful. They will be guided to discuss the emotional reaction significant others in their lives may have to their relapse or recovery. Patients will be assisted in exploring ways to respond to the emotions of others without this contributing to a relapse.  Therapeutic Goals: 1. Patient will identify two or more emotions that lead to a relapse for them 2. Patient will identify two emotions that result when they relapse 3. Patient will identify two emotions related to recovery 4. Patient will demonstrate ability to communicate their needs through discussion and/or role plays   Summary of Patient Progress:     Therapeutic Modalities:   Cognitive Behavioral Therapy Solution-Focused Therapy Assertiveness Training Relapse Prevention Therapy   Devona Konig, LCSW 06/13/2017 2:25 PM

## 2017-06-13 NOTE — Plan of Care (Signed)
  Safety: Ability to remain free from injury will improve 06/13/2017 2246 - Progressing by Providence Crosby, RN Note Pt safe on the unit at this time

## 2017-06-13 NOTE — [facility] Group Notes (Signed)
[place] Group Notes:  (Nursing/MHT/Case Management/Adjunct)  Date:  06/13/2017  Time:  12:42 AM  Type of Therapy:  Group Therapy  Participation Level:  Active  Participation Quality:  Appropriate  Affect:  Appropriate  Cognitive:  Alert  Insight:  Good  Engagement in Group:  Engaged  Modes of Intervention:  Support  Summary of Progress/Problems:  Isaiah Peterson 06/13/2017, 12:42 AM

## 2017-06-14 NOTE — Plan of Care (Signed)
Patient in bed upon arrival to the unit. Patient up for meals and is compliant with medications. Remains isolative to his room, denies SI/HI/AVH but reluctantly stated that he was not having any auditory hallucinations. Nurse will continue to talk with patient until he feels comfortable to endorse any active hallucinations. Milieu remains safe.

## 2017-06-14 NOTE — [facility] Group Notes (Signed)
LCSW Group Therapy Note  06/14/2017 1:15pm  Type of Therapy and Topic: Group Therapy: Holding on to Grudges   Participation Level: Active   Description of Group:  In this group patients will be asked to explore and define a grudge. Patients will be guided to discuss their thoughts, feelings, and reasons as to why people have grudges. Patients will process the impact grudges have on daily life and identify thoughts and feelings related to holding grudges. Facilitator will challenge patients to identify ways to let go of grudges and the benefits this provides. Patients will be confronted to address why one struggles letting go of grudges. Lastly, patients will identify feelings and thoughts related to what life would look like without grudges. This group will be process-oriented, with patients participating in exploration of their own experiences, giving and receiving support, and processing challenge from other group members.  Therapeutic Goals:  1. Patient will identify specific grudges related to their personal life.  2. Patient will identify feelings, thoughts, and beliefs around grudges.  3. Patient will identify how one releases grudges appropriately.  4. Patient will identify situations where they could have let go of the grudge, but instead chose to hold on.   Summary of Patient Progress: Pt reported he feels "good".  Pt shared with the group that he held a grudge with his long time girlfriend. He reported that it took him a long time to let go of the grudge but he was able to do it by thinking about the good times he spent with her. Pt was able to identify feelings, thoughts and beliefs around grudges. He shared with the group that talking to others helped him release some of his grudges appropriately. Patient participated and explored his own experiences, by giving and receiving support, and processing challenge from other group members.      Therapeutic Modalities:  Cognitive  Behavioral Therapy  Solution Focused Therapy  Motivational Interviewing  Brief Therapy   Norwin Aleman  CUEBAS-COLON, LCSW 06/14/2017 3:16 PM

## 2017-06-14 NOTE — Progress Notes (Signed)
[facility] MD Progress Note  06/14/2017 1:07 PM [place].  MRN:  [id number] Subjective: Patient follow-up for 42 year old man with schizophrenia.  He tells me he is feeling better.  He denies having any suicidal thoughts or any hallucinations.  No new physical complaints. Principal Problem: Schizoaffective disorder, bipolar type without good prognostic features ([place]) Diagnosis:   Patient Active Problem List   Diagnosis Date Noted  . Tobacco use disorder [F17.200] 06/08/2017  . GERD (gastroesophageal reflux disease) [K21.9] 06/08/2017  . Cannabis abuse [F12.10]   . Schizoaffective disorder, bipolar type without good prognostic features ([place]) [F25.0] 06/07/2017  . Dysphagia [R13.10] 05/28/2012  . Abdominal pain, chronic, epigastric [R10.13, G89.29] 05/28/2012  . Gallstones [K80.20] 08/06/2011  . Pancreatitis [K85.90] 08/06/2011  . Cannabis use disorder, moderate, dependence ([place]) [F12.20] 06/10/2011  . Schizophrenia, paranoid, chronic ([place]) [F20.0] 06/08/2011    Class: Acute   Total Time spent with patient: 20 minutes  Past Psychiatric History: Patient has a history of schizophrenia.  Doing much better on current medicine profile.  Past Medical History:  Past Medical History:  Diagnosis Date  . Bipolar 1 disorder ([place])   . Gallstones   . Pancreatitis    questionable mild biliary pancreatitis  . Schizophrenia, schizo-affective ([place])     Past Surgical History:  Procedure Laterality Date  . APPENDECTOMY    . CHOLECYSTECTOMY  02/28/2012   Procedure: LAPAROSCOPIC CHOLECYSTECTOMY;  Surgeon: Jamesetta So, MD;  Location: [facility];  Service: General;  Laterality: N/A;  . ESOPHAGOGASTRODUODENOSCOPY  07/11/2006   RMR: Esophageal foreign body (chicken bone), status post removal, as described above.  Complete esophagogastroduodenoscopy not carried out  . MULTIPLE TOOTH EXTRACTIONS     due to decay, nerve damage, edentulous   Family History:  Family History  Problem Relation Age of  Onset  . Aneurysm Mother        brain  . Liver disease Mother   . Colon cancer Maternal Uncle        great uncle  . Pancreatitis Maternal Uncle        died age 14  . Stomach cancer Maternal Aunt        great aunt   Family Psychiatric  History: None identified Social History:  Social History   Substance and Sexual Activity  Alcohol Use No   Comment: former quit about 10 years ago     Social History   Substance and Sexual Activity  Drug Use Yes  . Types: Marijuana   Comment: marijuana end of Jan 2014     Social History   Socioeconomic History  . Marital status: Single    Spouse name: None  . Number of children: None  . Years of education: None  . Highest education level: None  Social Needs  . Financial resource strain: None  . Food insecurity - worry: None  . Food insecurity - inability: None  . Transportation needs - medical: None  . Transportation needs - non-medical: None  Occupational History  . None  Tobacco Use  . Smoking status: Current Every Day Smoker    Packs/day: 1.50    Years: 20.00    Pack years: 30.00    Types: Cigarettes  . Smokeless tobacco: Never Used  Substance and Sexual Activity  . Alcohol use: No    Comment: former quit about 10 years ago  . Drug use: Yes    Types: Marijuana    Comment: marijuana end of Jan 2014   . Sexual activity: Yes  Other Topics Concern  . None  Social History Narrative  . None   Additional Social History:                         Sleep: Fair  Appetite:  Fair  Current Medications: Current Facility-Administered Medications  Medication Dose Route Frequency Provider Last Rate Last Dose  . acetaminophen (TYLENOL) tablet 650 mg  650 mg Oral Q6H PRN Chauncey Mann, MD      . alum & mag hydroxide-simeth (MAALOX/MYLANTA) 200-200-20 MG/5ML suspension 30 mL  30 mL Oral Q4H PRN Chauncey Mann, MD      . cloZAPine (CLOZARIL) tablet 150 mg  150 mg Oral QHS Pucilowska, Jolanta B, MD   150 mg at 06/13/17 2104   . [START ON 06/15/2017] cloZAPine (CLOZARIL) tablet 200 mg  200 mg Oral QHS Pucilowska, Jolanta B, MD      . divalproex (DEPAKOTE ER) 24 hr tablet 1,000 mg  1,000 mg Oral QHS Chauncey Mann, MD   1,000 mg at 06/13/17 2103  . docusate sodium (COLACE) capsule 100 mg  100 mg Oral Daily Pucilowska, Jolanta B, MD   100 mg at 06/14/17 0812  . feeding supplement (ENSURE ENLIVE) (ENSURE ENLIVE) liquid 237 mL  237 mL Oral TID BM Pucilowska, Jolanta B, MD   237 mL at 06/14/17 1015  . magic mouthwash  5 mL Oral TID Chauncey Mann, MD   5 mL at 06/14/17 1148  . magnesium hydroxide (MILK OF MAGNESIA) suspension 30 mL  30 mL Oral Daily PRN Chauncey Mann, MD      . nicotine (NICODERM CQ - dosed in mg/24 hours) patch 14 mg  14 mg Transdermal Daily Chauncey Mann, MD   14 mg at 06/14/17 2505  . OLANZapine (ZYPREXA) tablet 10 mg  10 mg Oral QHS Pucilowska, Jolanta B, MD   10 mg at 06/13/17 2105  . pantoprazole (PROTONIX) EC tablet 40 mg  40 mg Oral Daily Chauncey Mann, MD   40 mg at 06/14/17 3976  . polyethylene glycol (MIRALAX / GLYCOLAX) packet 17 g  17 g Oral Daily Pucilowska, Jolanta B, MD   17 g at 06/13/17 0823  . propranolol (INDERAL) tablet 10 mg  10 mg Oral TID Chauncey Mann, MD   10 mg at 06/14/17 1147  . traZODone (DESYREL) tablet 100 mg  100 mg Oral QHS Pucilowska, Jolanta B, MD   100 mg at 06/13/17 2105    Lab Results: No results found for this or any previous visit (from the past 48 hour(s)).  Blood Alcohol level:  Lab Results  Component Value Date   [facility] <10 06/06/2017   ETH <11 73/41/9379    Metabolic Disorder Labs: Lab Results  Component Value Date   HGBA1C 4.6 (L) 06/08/2017   MPG 85.32 06/08/2017   MPG 108 06/11/2011   Lab Results  Component Value Date   PROLACTIN 48.5 (H) 06/08/2017   Lab Results  Component Value Date   CHOL 156 06/08/2017   TRIG 252 (H) 06/08/2017   HDL 31 (L) 06/08/2017   CHOLHDL 5.0 06/08/2017   VLDL 50 (H) 06/08/2017   LDLCALC 75 06/08/2017    LDLCALC 63 06/11/2011    Physical Findings: AIMS:  , ,  ,  ,    CIWA:    COWS:     Musculoskeletal: Strength & Muscle Tone: within normal limits Gait & Station: normal Patient leans: N/A  Psychiatric Specialty  Exam: Physical Exam  Nursing note and vitals reviewed. Constitutional: He appears well-developed and well-nourished.  HENT:  Head: Normocephalic and atraumatic.  Eyes: Conjunctivae are normal. Pupils are equal, round, and reactive to light.  Neck: Normal range of motion.  Cardiovascular: Regular rhythm and normal heart sounds.  Respiratory: Effort normal. No respiratory distress.  GI: Soft.  Musculoskeletal: Normal range of motion.  Neurological: He is alert.  Skin: Skin is warm and dry.  Psychiatric: Judgment normal. His mood appears anxious. His speech is delayed. He is slowed and withdrawn. Thought content is not paranoid. Cognition and memory are impaired. He expresses no homicidal and no suicidal ideation.    Review of Systems  Constitutional: Negative.   HENT: Negative.   Eyes: Negative.   Respiratory: Negative.   Cardiovascular: Negative.   Gastrointestinal: Negative.   Musculoskeletal: Negative.   Skin: Negative.   Neurological: Negative.   Psychiatric/Behavioral: Negative for depression, hallucinations, memory loss, substance abuse and suicidal ideas. The patient is not nervous/anxious and does not have insomnia.     Blood pressure 134/87, pulse (!) 112, temperature (!) 97.4 F (36.3 C), temperature source Oral, resp. rate 18, height 5\' 11"  (1.803 m), weight 68.2 kg (150 lb 4 oz), SpO2 99 %.Body mass index is 20.96 kg/m.  General Appearance: Casual  Eye Contact:  Fair  Speech:  Slow  Volume:  Decreased  Mood:  Euthymic  Affect:  Constricted  Thought Process:  Goal Directed  Orientation:  Full (Time, Place, and Person)  Thought Content:  Logical  Suicidal Thoughts:  No  Homicidal Thoughts:  No  Memory:  Immediate;   Fair Recent;   Fair Remote;    Fair  Judgement:  Fair  Insight:  Fair  Psychomotor Activity:  Decreased  Concentration:  Concentration: Fair  Recall:  [employer] of Knowledge:  Fair  Language:  Fair  Akathisia:  No  Handed:  Right  AIMS (if indicated):     Assets:  Desire for Improvement Housing Resilience  ADL's:  Intact  Cognition:  Impaired,  Mild  Sleep:  Number of Hours: 7     Treatment Plan Summary: Plan 42 year old man with schizophrenia who has shown great improvement.  As he tells me, he came into the hospital with "suicidal thoughts hearing things and seeing things".  He denies all of that now.  He is smiling relaxed calm.  Does interact with people a little bit but spends a lot of time alone.  Taking care of his basic ADLs.  No physical complaints.  Probably ready for discharge soon.  No change to treatment plan.  Alethia Berthold, MD 06/14/2017, 1:07 PM

## 2017-06-15 ENCOUNTER — Inpatient Hospital Stay: Payer: Medicare Other

## 2017-06-15 LAB — INFLUENZA PANEL BY PCR (TYPE A & B)
Influenza A By PCR: NEGATIVE
Influenza B By PCR: NEGATIVE

## 2017-06-15 MED ORDER — OSELTAMIVIR PHOSPHATE 75 MG PO CAPS
75.0000 mg | ORAL_CAPSULE | Freq: Two times a day (BID) | ORAL | Status: DC
  Filled 2017-06-15 (×2): qty 1

## 2017-06-15 NOTE — Progress Notes (Signed)
Patient presents this am alert and orient, pleasant and cooperative with care. Medication compliant. Appropriate with staff and peers. Denies SI, HI, AVH. Patient had fever this am and chills. Flu swab negative. Patient no longer complains of symptoms. Temp wnl 98.9, in no distress. Eating meals well. Remains safe on unit with q 15 min checks.

## 2017-06-15 NOTE — Progress Notes (Signed)
[facility] MD Progress Note  06/15/2017 1:59 PM Isaiah Daune Perch.  MRN:  [id number] Subjective: Follow-up for 42 year old man with schizophrenia.  Patient has no psychiatric complaints.  Denies hallucinations.  Says his mood is feeling good.  Denies suicidal thoughts.  Patient this morning had an episode of nausea and vomiting and nurses found him to have a slightly elevated temperature.  Concern was immediately raised about flu.  Patient is now stating he feels much better.  No subsequent nausea and no other symptoms of sickness. Principal Problem: Schizoaffective disorder, bipolar type without good prognostic features ([place]) Diagnosis:   Patient Active Problem List   Diagnosis Date Noted  . Tobacco use disorder [F17.200] 06/08/2017  . GERD (gastroesophageal reflux disease) [K21.9] 06/08/2017  . Cannabis abuse [F12.10]   . Schizoaffective disorder, bipolar type without good prognostic features ([place]) [F25.0] 06/07/2017  . Dysphagia [R13.10] 05/28/2012  . Abdominal pain, chronic, epigastric [R10.13, G89.29] 05/28/2012  . Gallstones [K80.20] 08/06/2011  . Pancreatitis [K85.90] 08/06/2011  . Cannabis use disorder, moderate, dependence ([place]) [F12.20] 06/10/2011  . Schizophrenia, paranoid, chronic ([place]) [F20.0] 06/08/2011    Class: Acute   Total Time spent with patient: 20 minutes  Past Psychiatric History: History of schizophrenia schizophrenia  Past Medical History:  Past Medical History:  Diagnosis Date  . Bipolar 1 disorder ([place])   . Gallstones   . Pancreatitis    questionable mild biliary pancreatitis  . Schizophrenia, schizo-affective ([place])     Past Surgical History:  Procedure Laterality Date  . APPENDECTOMY    . CHOLECYSTECTOMY  02/28/2012   Procedure: LAPAROSCOPIC CHOLECYSTECTOMY;  Surgeon: Jamesetta So, MD;  Location: [facility];  Service: General;  Laterality: N/A;  . ESOPHAGOGASTRODUODENOSCOPY  07/11/2006   RMR: Esophageal foreign body (chicken bone), status post removal, as  described above.  Complete esophagogastroduodenoscopy not carried out  . MULTIPLE TOOTH EXTRACTIONS     due to decay, nerve damage, edentulous   Family History:  Family History  Problem Relation Age of Onset  . Aneurysm Mother        brain  . Liver disease Mother   . Colon cancer Maternal Uncle        great uncle  . Pancreatitis Maternal Uncle        died age 44  . Stomach cancer Maternal Aunt        great aunt   Family Psychiatric  History: None Social History:  Social History   Substance and Sexual Activity  Alcohol Use No   Comment: former quit about 10 years ago     Social History   Substance and Sexual Activity  Drug Use Yes  . Types: Marijuana   Comment: marijuana end of Jan 2014     Social History   Socioeconomic History  . Marital status: Single    Spouse name: None  . Number of children: None  . Years of education: None  . Highest education level: None  Social Needs  . Financial resource strain: None  . Food insecurity - worry: None  . Food insecurity - inability: None  . Transportation needs - medical: None  . Transportation needs - non-medical: None  Occupational History  . None  Tobacco Use  . Smoking status: Current Every Day Smoker    Packs/day: 1.50    Years: 20.00    Pack years: 30.00    Types: Cigarettes  . Smokeless tobacco: Never Used  Substance and Sexual Activity  . Alcohol use: No    Comment:  former quit about 10 years ago  . Drug use: Yes    Types: Marijuana    Comment: marijuana end of Jan 2014   . Sexual activity: Yes  Other Topics Concern  . None  Social History Narrative  . None   Additional Social History:                         Sleep: Fair  Appetite:  Fair  Current Medications: Current Facility-Administered Medications  Medication Dose Route Frequency Provider Last Rate Last Dose  . acetaminophen (TYLENOL) tablet 650 mg  650 mg Oral Q6H PRN Chauncey Mann, MD      . alum & mag hydroxide-simeth  (MAALOX/MYLANTA) 200-200-20 MG/5ML suspension 30 mL  30 mL Oral Q4H PRN Chauncey Mann, MD      . cloZAPine (CLOZARIL) tablet 200 mg  200 mg Oral QHS Pucilowska, Jolanta B, MD      . divalproex (DEPAKOTE ER) 24 hr tablet 1,000 mg  1,000 mg Oral QHS Chauncey Mann, MD   1,000 mg at 06/14/17 2106  . docusate sodium (COLACE) capsule 100 mg  100 mg Oral Daily Pucilowska, Jolanta B, MD   100 mg at 06/15/17 0801  . feeding supplement (ENSURE ENLIVE) (ENSURE ENLIVE) liquid 237 mL  237 mL Oral TID BM Pucilowska, Jolanta B, MD   237 mL at 06/15/17 1115  . magic mouthwash  5 mL Oral TID Chauncey Mann, MD   5 mL at 06/15/17 1218  . magnesium hydroxide (MILK OF MAGNESIA) suspension 30 mL  30 mL Oral Daily PRN Chauncey Mann, MD      . nicotine (NICODERM CQ - dosed in mg/24 hours) patch 14 mg  14 mg Transdermal Daily Chauncey Mann, MD   14 mg at 06/15/17 0801  . OLANZapine (ZYPREXA) tablet 10 mg  10 mg Oral QHS Pucilowska, Jolanta B, MD   10 mg at 06/14/17 2106  . oseltamivir (TAMIFLU) capsule 75 mg  75 mg Oral BID Alphia Behanna T, MD      . pantoprazole (PROTONIX) EC tablet 40 mg  40 mg Oral Daily Chauncey Mann, MD   40 mg at 06/15/17 0801  . polyethylene glycol (MIRALAX / GLYCOLAX) packet 17 g  17 g Oral Daily Pucilowska, Jolanta B, MD   17 g at 06/15/17 0801  . propranolol (INDERAL) tablet 10 mg  10 mg Oral TID Chauncey Mann, MD   10 mg at 06/15/17 1218  . traZODone (DESYREL) tablet 100 mg  100 mg Oral QHS Pucilowska, Jolanta B, MD   100 mg at 06/14/17 2106    Lab Results:  Results for orders placed or performed during the hospital encounter of 06/07/17 (from the past 48 hour(s))  Influenza panel by PCR (type A & B)     Status: None   Collection Time: 06/15/17 10:08 AM  Result Value Ref Range   Influenza A By PCR NEGATIVE NEGATIVE   Influenza B By PCR NEGATIVE NEGATIVE    Comment: (NOTE) The Xpert Xpress Flu assay is intended as an aid in the diagnosis of  influenza and should not be used as a  sole basis for treatment.  This  assay is FDA approved for nasopharyngeal swab specimens only. Nasal  washings and aspirates are unacceptable for Xpert Xpress Flu testing. Performed at [facility], [address], [place], [place] [address]     Blood Alcohol level:  Lab Results  Component Value Date   [facility] <10 06/06/2017   ETH <11 15/17/6160    Metabolic Disorder Labs: Lab Results  Component Value Date   HGBA1C 4.6 (L) 06/08/2017   MPG 85.32 06/08/2017   MPG 108 06/11/2011   Lab Results  Component Value Date   PROLACTIN 48.5 (H) 06/08/2017   Lab Results  Component Value Date   CHOL 156 06/08/2017   TRIG 252 (H) 06/08/2017   HDL 31 (L) 06/08/2017   CHOLHDL 5.0 06/08/2017   VLDL 50 (H) 06/08/2017   LDLCALC 75 06/08/2017   LDLCALC 63 06/11/2011    Physical Findings: AIMS:  , ,  ,  ,    CIWA:    COWS:     Musculoskeletal: Strength & Muscle Tone: within normal limits Gait & Station: normal Patient leans: N/A  Psychiatric Specialty Exam: Physical Exam  Nursing note and vitals reviewed. Constitutional: He appears well-developed and well-nourished.  HENT:  Head: Normocephalic and atraumatic.  Eyes: Conjunctivae are normal. Pupils are equal, round, and reactive to light.  Neck: Normal range of motion.  Cardiovascular: Regular rhythm and normal heart sounds.  Respiratory: Effort normal. No respiratory distress.  GI: Soft.  Musculoskeletal: Normal range of motion.  Neurological: He is alert.  Skin: Skin is warm and dry.  Psychiatric: Judgment normal. His affect is blunt. His speech is delayed. He is slowed. Thought content is not delusional. Cognition and memory are impaired. He expresses no homicidal and no suicidal ideation.    Review of Systems  Constitutional: Positive for fever.  HENT: Negative.   Eyes: Negative.   Respiratory: Negative.   Cardiovascular: Negative.   Gastrointestinal: Positive for abdominal pain and vomiting.  Musculoskeletal:  Negative.   Skin: Negative.   Neurological: Negative.   Psychiatric/Behavioral: Negative.     Blood pressure 134/74, pulse 89, temperature (!) 97.4 F (36.3 C), temperature source Oral, resp. rate 18, height 5\' 11"  (1.803 m), weight 68.2 kg (150 lb 4 oz), SpO2 99 %.Body mass index is 20.96 kg/m.  General Appearance: Casual  Eye Contact:  Fair  Speech:  Slow  Volume:  Decreased  Mood:  Euthymic  Affect:  Constricted  Thought Process:  Goal Directed  Orientation:  Full (Time, Place, and Person)  Thought Content:  Logical  Suicidal Thoughts:  No  Homicidal Thoughts:  No  Memory:  Immediate;   Fair Recent;   Fair Remote;   Fair  Judgement:  Fair  Insight:  Fair  Psychomotor Activity:  Decreased  Concentration:  Concentration: Fair  Recall:  [employer] of Knowledge:  Fair  Language:  Fair  Akathisia:  No  Handed:  Right  AIMS (if indicated):     Assets:  Desire for Improvement Housing Physical Health Resilience Social Support  ADL's:  Intact  Cognition:  Impaired,  Mild  Sleep:  Number of Hours: 7.15     Treatment Plan Summary: Daily contact with patient to assess and evaluate symptoms and progress in treatment, Medication management and Plan Patient was thought this morning to likely have fluent and orders were placed for Tamiflu.  Also ordered a screen for influenza which turned out to come back negative.  Hospitalist was consulted.  Decision was ultimately made that he did not meet need to be transferred to another unit.  He is feeling much better at this point.  We can disc continue the Tamiflu.  No other change to medicine.  Alethia Berthold, MD 06/15/2017, 1:59 PM

## 2017-06-15 NOTE — Progress Notes (Signed)
NUTRITION ASSESSMENT  Pt identified as at risk on the Malnutrition Screen Tool  INTERVENTION: Current "Soft" diet is actually a GI diet that is low in fiber. Will not help with patient's poor dentition. RD will downgrade diet to dysphagia 3 (mechanical soft) with thin liquids.  Continue Ensure Enlive po TID, each supplement provides 350 kcal and 20 grams of protein.  NUTRITION DIAGNOSIS: Unintentional weight loss related to sub-optimal intake as evidenced by pt report.   Goal: Pt to meet >/= 90% of their estimated nutrition needs.  Monitor:  PO intake  Assessment:  42 y.o. male with PMHx of bipolar 1 disorder, schizoaffective disorder, gallstones, hx mild biliary pancreatitis who was admitted under IVC endorsing suicidal and homicidal thoughts in context of active psychotic symptoms.  Per chart patient is having poor PO intake in setting of poor dentition. Of note, he is on the GI soft diet, which is not mechanical soft. RD will downgrade diet. Patient is drinking Ensure TID. He is ordered for Tamiflu and had an episode of emesis last night. Could not find flu test in labs.  If current weight is accurate patient has gained about 12 lbs since 2014.  Meal completion variable in chart from 0-100%  Height: Ht Readings from Last 1 Encounters:  06/07/17 5\' 11"  (1.803 m)    Weight: Wt Readings from Last 1 Encounters:  06/07/17 150 lb 4 oz (68.2 kg)    Weight Hx: Wt Readings from Last 10 Encounters:  06/07/17 150 lb 4 oz (68.2 kg)  06/06/17 150 lb (68 kg)  05/28/12 138 lb 9.6 oz (62.9 kg)  02/27/12 136 lb (61.7 kg)  02/19/12 134 lb 6.4 oz (61 kg)  01/18/12 128 lb (58.1 kg)  08/06/11 147 lb 9.6 oz (67 kg)  08/01/11 145 lb (65.8 kg)  06/08/11 131 lb (59.4 kg)  06/07/11 130 lb (59 kg)    BMI:  Body mass index is 20.96 kg/m. Pt meets criteria for normal weight based on current BMI.  Estimated Nutritional Needs: Kcal: 25-30 kcal/kg Protein: > 1 gram protein/kg Fluid: 1  ml/kcal  Diet Order: DIET SOFT Room service appropriate? Yes; Fluid consistency: Thin Pt is also offered choice of unit snacks mid-morning and mid-afternoon.   Lab results and medications reviewed.   Willey Blade, MS, [place], LDN Office: [phone] Pager: [phone] After Hours/Weekend Pager: [phone]

## 2017-06-15 NOTE — Plan of Care (Signed)
  Progressing Anxiety Demonstrates healthy coping skills 06/15/2017 1827 - Progressing by Jacinto Halim, RN Activity: Interest or engagement in activities will improve 06/15/2017 1827 - Progressing by Jacinto Halim, RN Sleeping patterns will improve 06/15/2017 1827 - Progressing by Jacinto Halim, RN Sleeping patterns will improve 06/15/2017 1827 - Progressing by Jacinto Halim, RN Education: Knowledge of [place] Education information/materials will improve 06/15/2017 1827 - Progressing by Jacinto Halim, RN Mental status will improve 06/15/2017 1827 - Progressing by Jacinto Halim, RN Coping: Ability to verbalize frustrations and anger appropriately will improve 06/15/2017 1827 - Progressing by Jacinto Halim, RN Ability to demonstrate self-control will improve 06/15/2017 1827 - Progressing by Jacinto Halim, RN Activity: Interest or engagement in leisure activities will improve 06/15/2017 1827 - Progressing by Jacinto Halim, RN Education: Utilization of techniques to improve thought processes will improve 06/15/2017 1827 - Progressing by Jacinto Halim, RN Education: Knowledge of General Education information will improve 06/15/2017 1827 - Progressing by Jacinto Halim, RN Health Behavior/Discharge Planning: Ability to manage health-related needs will improve 06/15/2017 1827 - Progressing by Jacinto Halim, RN Activity: Risk for activity intolerance will decrease 06/15/2017 1827 - Progressing by Jacinto Halim, RN Coping: Level of anxiety will decrease 06/15/2017 1827 - Progressing by Jacinto Halim, RN Pain Managment: General experience of comfort will improve 06/15/2017 1827 - Progressing by Jacinto Halim, RN Safety: Ability to remain free from injury will improve 06/15/2017 1827 - Progressing by Jacinto Halim, RN

## 2017-06-15 NOTE — Consult Note (Signed)
[place] Consultation  Isaiah Peterson [name]. NGE:[id number] DOB: 03-11-1976 DOA: 06/07/2017 PCP: Ellamae Sia, MD   Requesting physician: Alethia Berthold MD Date of consultation: 06/15/2017 Reason for consultation: Questionable flu  CHIEF COMPLAINT: Cold, chills.  HISTORY OF PRESENT ILLNESS: Isaiah Peterson  is a 42 y.o. male with a known history of bipolar 1 disorder, pancreatitis in the past, schizophrenia, cholelithiasis currently in the behavioral unit.  Hospitalist service was consulted for chills and cold and congestion been going on from this morning.  Patient also had a low-grade fever of 100.8 F.  No complains of any chest pain.  No shortness of breath.  Has decreased appetite.  Had one episode of vomiting.  Flu test was done and was negative for influenza a and B.   PAST MEDICAL HISTORY:   Past Medical History:  Diagnosis Date  . Bipolar 1 disorder ([place])   . Gallstones   . Pancreatitis    questionable mild biliary pancreatitis  . Schizophrenia, schizo-affective ([place])     PAST SURGICAL HISTORY:  Past Surgical History:  Procedure Laterality Date  . APPENDECTOMY    . CHOLECYSTECTOMY  02/28/2012   Procedure: LAPAROSCOPIC CHOLECYSTECTOMY;  Surgeon: Jamesetta So, MD;  Location: [facility];  Service: General;  Laterality: N/A;  . ESOPHAGOGASTRODUODENOSCOPY  07/11/2006   RMR: Esophageal foreign body (chicken bone), status post removal, as described above.  Complete esophagogastroduodenoscopy not carried out  . MULTIPLE TOOTH EXTRACTIONS     due to decay, nerve damage, edentulous    SOCIAL HISTORY:  Social History   Tobacco Use  . Smoking status: Current Every Day Smoker    Packs/day: 1.50    Years: 20.00    Pack years: 30.00    Types: Cigarettes  . Smokeless tobacco: Never Used  Substance Use Topics  . Alcohol use: No    Comment: former quit about 10 years ago    FAMILY HISTORY:  Family History  Problem Relation Age of Onset  . Aneurysm Mother         brain  . Liver disease Mother   . Colon cancer Maternal Uncle        great uncle  . Pancreatitis Maternal Uncle        died age 5  . Stomach cancer Maternal Aunt        great aunt    DRUG ALLERGIES:  Allergies  Allergen Reactions  . Haldol [Haloperidol Lactate] Other (See Comments)    Tardive dyskinesia.    REVIEW OF SYSTEMS:   CONSTITUTIONAL: Low grade fever this am, no fatigue or weakness.  EYES: No blurred or double vision.  EARS, NOSE, AND THROAT: No tinnitus or ear pain.  RESPIRATORY: No cough, shortness of breath, wheezing or hemoptysis.  Had congestion. CARDIOVASCULAR: No chest pain, orthopnea, edema.  GASTROINTESTINAL: No nausea,  diarrhea or abdominal pain.  Had one episode of vomiting. GENITOURINARY: No dysuria, hematuria.  ENDOCRINE: No polyuria, nocturia,  HEMATOLOGY: No anemia, easy bruising or bleeding SKIN: No rash or lesion. MUSCULOSKELETAL: No joint pain or arthritis.   NEUROLOGIC: No tingling, numbness, weakness.  PSYCHIATRY: No anxiety or depression.   MEDICATIONS AT HOME:  Prior to Admission medications   Medication Sig Start Date End Date Taking? Authorizing Provider  Alum & Mag Hydroxide-Simeth (MAGIC MOUTHWASH) SOLN Take 5 mLs by mouth 3 (three) times daily. Patient not taking: Reported on 06/06/2017 01/05/13   Junius Creamer, NP  divalproex (DEPAKOTE ER) 500 MG 24 hr tablet Take 1,000 mg by mouth  at bedtime.     [provider]  HYDROcodone-acetaminophen (NORCO/VICODIN) 5-325 MG per tablet Take 1 tablet by mouth every 6 (six) hours as needed for pain. Patient not taking: Reported on 06/06/2017 02/04/13   Junius Creamer, NP  methocarbamol (ROBAXIN) 500 MG tablet Take 1 tablet (500 mg total) by mouth 4 (four) times daily. Patient not taking: Reported on 06/06/2017 02/04/13   Junius Creamer, NP  mirtazapine (REMERON) 7.5 MG tablet Take 1 tablet by mouth at bedtime. 05/06/17   [provider]  omeprazole (PRILOSEC) 20 MG capsule Take 1 capsule  (20 mg total) by mouth daily. Patient not taking: Reported on 06/06/2017 12/17/12   Dorie Rank, MD  paliperidone (INVEGA SUSTENNA) 156 MG/ML SUSP injection Inject 1 mL into the muscle every 30 (thirty) days.     [provider]  predniSONE (DELTASONE) 20 MG tablet 3 Tabs PO Days 1-3, then 2 tabs PO Days 4-6, then 1 tab PO Day 7-9, then Half Tab PO Day 10-12 Patient not taking: Reported on 06/06/2017 02/04/13   Junius Creamer, NP  propranolol (INDERAL) 80 MG tablet Take by mouth at bedtime.     [provider]  QUEtiapine (SEROQUEL) 400 MG tablet Take 400 mg by mouth at bedtime.     [provider]      PHYSICAL EXAMINATION:   VITAL SIGNS: Blood pressure 134/74, pulse 89, temperature (!) 97.4 F (36.3 C), temperature source Oral, resp. rate 18, height 5\' 11"  (1.803 m), weight 68.2 kg (150 lb 4 oz), SpO2 99 %.  GENERAL:  42 y.o.-year-old patient lying in the bed with no acute distress.  EYES: Pupils equal, round, reactive to light and accommodation. No scleral icterus. Extraocular muscles intact.  HEENT: Head atraumatic, normocephalic. Oropharynx and nasopharynx clear.  NECK:  Supple, no jugular venous distention. No thyroid enlargement, no tenderness.  LUNGS: Normal breath sounds bilaterally, no wheezing, rales,rhonchi or crepitation. No use of accessory muscles of respiration.  CARDIOVASCULAR: S1, S2 normal. No murmurs, rubs, or gallops.  ABDOMEN: Soft, nontender, nondistended. Bowel sounds present. No organomegaly or mass.  EXTREMITIES: No pedal edema, cyanosis, or clubbing.  NEUROLOGIC: Cranial nerves II through XII are intact. Muscle strength 5/5 in all extremities. Sensation intact. Gait not checked.  PSYCHIATRIC: The patient is alert and oriented x 3.  SKIN: No obvious rash, lesion, or ulcer.   LABORATORY PANEL:   CBC Recent Labs  Lab 06/09/17 1355  WBC 8.8  HGB 13.6  HCT 38.9*  PLT 127*  MCV 93.2  MCH 32.5  MCHC 34.9  RDW 12.1  LYMPHSABS 1.8   MONOABS 1.2*  EOSABS 0.2  BASOSABS 0.1   ------------------------------------------------------------------------------------------------------------------  Chemistries  No results for input(s): NA, K, CL, CO2, GLUCOSE, BUN, CREATININE, CALCIUM, MG, AST, ALT, ALKPHOS, BILITOT in the last 168 hours.  Invalid input(s): GFRCGP ------------------------------------------------------------------------------------------------------------------ estimated creatinine clearance is 90.2 mL/min (by C-G formula based on SCr of 1.05 mg/dL). ------------------------------------------------------------------------------------------------------------------ No results for input(s): TSH, T4TOTAL, T3FREE, THYROIDAB in the last 72 hours.  Invalid input(s): FREET3   Coagulation profile No results for input(s): INR, PROTIME in the last 168 hours. ------------------------------------------------------------------------------------------------------------------- No results for input(s): DDIMER in the last 72 hours. -------------------------------------------------------------------------------------------------------------------  Cardiac Enzymes No results for input(s): CKMB, TROPONINI, MYOGLOBIN in the last 168 hours.  Invalid input(s): CK ------------------------------------------------------------------------------------------------------------------ Invalid input(s): POCBNP  ---------------------------------------------------------------------------------------------------------------  Urinalysis    Component Value Date/Time   COLORURINE YELLOW 12/17/2012 [place] 12/17/2012 1917   LABSPEC 1.006 12/17/2012 1917   PHURINE  7.0 12/17/2012 1917   GLUCOSEU NEGATIVE 12/17/2012 [place] NEGATIVE 12/17/2012 [place] NEGATIVE 12/17/2012 [place] 12/17/2012 [place] NEGATIVE 12/17/2012 1917   UROBILINOGEN 1.0 12/17/2012 1917   NITRITE NEGATIVE  12/17/2012 1917   LEUKOCYTESUR NEGATIVE 12/17/2012 1917     RADIOLOGY: No results found.  EKG: Orders placed or performed during the hospital encounter of 06/07/17  . EKG 12-Lead  . EKG 12-Lead    IMPRESSION AND PLAN: 42 year old male patient with history of pancreatitis, bipolar 1 disorder, schizophrenia currently in behavioral unit consulted for cold, congestion.  1.  Acute viral syndrome As needed Tylenol Nutritional supplements like Ensure  2.  Flu test negative No need for droplet isolation  3.  Check chest x-ray and urinalysis to assess for any pneumonia and UTI  4.  Bipolar disorder and schizophrenia Continue psych management Thank you for the consultation we will follow patient  All the records are reviewed and case discussed with ED provider. Management plans discussed with the patient, family and they are in agreement.  CODE STATUS:FULL CODE    Code Status Orders  (From admission, onward)        Start     Ordered   06/07/17 1602  Full code  Continuous     06/07/17 1601    Code Status History    Date Active Date Inactive Code Status Order ID Comments User Context   06/07/2011 10:57 06/08/2011 05:35 Full Code [id number]  Kathalene Frames, MD ED       TOTAL TIME TAKING CARE OF THIS PATIENT: 50 minutes.    Saundra Shelling M.D on 06/15/2017 at 11:42 AM  Between 7am to 6pm - Pager - [phone]  After 6pm go to [email or web address] - password [email or web address]  [facility] Office  [phone]  CC: Primary care physician; Ellamae Sia, MD

## 2017-06-15 NOTE — Progress Notes (Signed)
Patient shivering, reports being extremely cold. Patient reports to [facility] that he had vomited earlier. Patient has temp 100.8. Md notified

## 2017-06-15 NOTE — Plan of Care (Signed)
  Progressing Anxiety Demonstrates healthy coping skills 06/15/2017 0108 - Progressing by Corinna Capra, RN Activity: Interest or engagement in activities will improve 06/15/2017 0108 - Progressing by Corinna Capra, RN Sleeping patterns will improve 06/15/2017 0108 - Progressing by Corinna Capra, RN Sleeping patterns will improve 06/15/2017 0108 - Progressing by Corinna Capra, RN Education: Knowledge of [place] Education information/materials will improve 06/15/2017 0108 - Progressing by Corinna Capra, RN Mental status will improve 06/15/2017 0108 - Progressing by Corinna Capra, RN Coping: Ability to verbalize frustrations and anger appropriately will improve 06/15/2017 0108 - Progressing by Corinna Capra, RN Ability to demonstrate self-control will improve 06/15/2017 0108 - Progressing by Corinna Capra, RN Activity: Interest or engagement in leisure activities will improve 06/15/2017 0108 - Progressing by Corinna Capra, RN Education: Utilization of techniques to improve thought processes will improve 06/15/2017 0108 - Progressing by Corinna Capra, RN Education: Knowledge of [place] Education information will improve 06/15/2017 0108 - Progressing by Corinna Capra, RN Health Behavior/Discharge Planning: Ability to manage health-related needs will improve 06/15/2017 0108 - Progressing by Corinna Capra, RN Activity: Risk for activity intolerance will decrease 06/15/2017 0108 - Progressing by Corinna Capra, RN Coping: Level of anxiety will decrease 06/15/2017 0108 - Progressing by Corinna Capra, RN Pain Managment: General experience of comfort will improve 06/15/2017 0108 - Progressing by Corinna Capra, RN Safety: Ability to remain free from injury will improve 06/15/2017 0108 - Progressing by Corinna Capra, RN

## 2017-06-15 NOTE — [facility] Group Notes (Signed)
LCSW Group Therapy Note 06/15/2017 1:15pm  Type of Therapy and Topic: Group Therapy: Feelings Around Returning Home & Establishing a Supportive Framework and Supporting Oneself When Supports Not Available  Participation Level: Did Not Attend  Description of Group:  Patients first processed thoughts and feelings about upcoming discharge. These included fears of upcoming changes, lack of change, new living environments, judgements and expectations from others and overall stigma of mental health issues. The group then discussed the definition of a supportive framework, what that looks and feels like, and how do to discern it from an unhealthy non-supportive network. The group identified different types of supports as well as what to do when your family/friends are less than helpful or unavailable  Therapeutic Goals  1. Patient will identify one healthy supportive network that they can use at discharge. 2. Patient will identify one factor of a supportive framework and how to tell it from an unhealthy network. 3. Patient able to identify one coping skill to use when they do not have positive supports from others. 4. Patient will demonstrate ability to communicate their needs through discussion and/or role plays.  Summary of Patient Progress:    Therapeutic Modalities Cognitive Behavioral Therapy Motivational Interviewing   Cheree Ditto, LCSW 06/15/2017 12:31 PM

## 2017-06-16 LAB — CBC WITH DIFFERENTIAL/PLATELET
Basophils Absolute: 0 10*3/uL (ref 0–0.1)
Basophils Relative: 0 %
EOS PCT: 1 %
Eosinophils Absolute: 0.1 10*3/uL (ref 0–0.7)
HCT: 36.5 % — ABNORMAL LOW (ref 40.0–52.0)
Hemoglobin: 12.8 g/dL — ABNORMAL LOW (ref 13.0–18.0)
LYMPHS PCT: 7 %
Lymphs Abs: 1 10*3/uL (ref 1.0–3.6)
MCH: 32.3 pg (ref 26.0–34.0)
MCHC: 35 g/dL (ref 32.0–36.0)
MCV: 92.5 fL (ref 80.0–100.0)
MONO ABS: 2 10*3/uL — AB (ref 0.2–1.0)
MONOS PCT: 14 %
NEUTROS PCT: 78 %
Neutro Abs: 11.4 10*3/uL — ABNORMAL HIGH (ref 1.4–6.5)
Platelets: 162 10*3/uL (ref 150–440)
RBC: 3.95 MIL/uL — AB (ref 4.40–5.90)
RDW: 12 % (ref 11.5–14.5)
WBC: 14.5 10*3/uL — AB (ref 3.8–10.6)

## 2017-06-16 MED ORDER — PROPRANOLOL HCL 20 MG PO TABS: 20.0000 mg | ORAL_TABLET | Freq: Three times a day (TID) | ORAL | 1 refills | Status: DC

## 2017-06-16 MED ORDER — POLYETHYLENE GLYCOL 3350 17 G PO PACK: 17.0000 g | PACK | Freq: Every day | ORAL | 1 refills | Status: DC

## 2017-06-16 MED ORDER — PALIPERIDONE PALMITATE 156 MG/ML IM SUSP: 156.0000 mg | INTRAMUSCULAR | 1 refills | Status: DC

## 2017-06-16 MED ORDER — PALIPERIDONE PALMITATE 234 MG/1.5ML IM SUSP: 234.0000 mg | INTRAMUSCULAR | Status: DC

## 2017-06-16 MED ORDER — CLOZAPINE 200 MG PO TABS: 200.0000 mg | ORAL_TABLET | Freq: Every day | ORAL | 1 refills | Status: DC

## 2017-06-16 MED ORDER — OMEPRAZOLE 20 MG PO CPDR: 20.0000 mg | DELAYED_RELEASE_CAPSULE | Freq: Every day | ORAL | 1 refills | Status: DC

## 2017-06-16 MED ORDER — TEMAZEPAM 15 MG PO CAPS: 15.0000 mg | ORAL_CAPSULE | Freq: Every day | ORAL | 1 refills | Status: DC

## 2017-06-16 MED ORDER — PROPRANOLOL HCL 20 MG PO TABS
20.0000 mg | ORAL_TABLET | Freq: Three times a day (TID) | ORAL | Status: DC
  Administered 2017-06-16 – 2017-06-17 (×3): 20 mg via ORAL
  Filled 2017-06-16 (×3): qty 1

## 2017-06-16 MED ORDER — DOCUSATE SODIUM 100 MG PO CAPS: 100.0000 mg | ORAL_CAPSULE | Freq: Every day | ORAL | 1 refills | Status: DC

## 2017-06-16 MED ORDER — TEMAZEPAM 15 MG PO CAPS
15.0000 mg | ORAL_CAPSULE | Freq: Every day | ORAL | Status: DC
  Administered 2017-06-16: 15 mg via ORAL
  Filled 2017-06-16: qty 1

## 2017-06-16 MED ORDER — DIVALPROEX SODIUM ER 500 MG PO TB24: 1000.0000 mg | ORAL_TABLET | Freq: Every day | ORAL | 1 refills | Status: DC

## 2017-06-16 NOTE — Progress Notes (Signed)
[facility] [place] NAME: Isaiah Peterson    MR#:  [id number]  DATE OF BIRTH:  1976/04/06  SUBJECTIVE:  CHIEF COMPLAINT:  No chief complaint on file.  -No further fevers. Feels all right.  REVIEW OF SYSTEMS:  Review of Systems  Constitutional: Negative for chills, fever and malaise/fatigue.  HENT: Negative for congestion, ear discharge, hearing loss and nosebleeds.   Eyes: Negative for blurred vision and double vision.  Respiratory: Negative for cough, shortness of breath and wheezing.   Cardiovascular: Negative for chest pain, palpitations and leg swelling.  Gastrointestinal: Negative for abdominal pain, constipation, diarrhea, nausea and vomiting.  Genitourinary: Negative for dysuria.  Musculoskeletal: Negative for myalgias.  Neurological: Negative for dizziness, speech change, focal weakness, seizures and headaches.  Psychiatric/Behavioral: Negative for depression.    DRUG ALLERGIES:   Allergies  Allergen Reactions  . Haldol [Haloperidol Lactate] Other (See Comments)    Tardive dyskinesia.    VITALS:  Blood pressure 137/83, pulse (!) 113, temperature 98 F (36.7 C), temperature source Oral, resp. rate 16, height 5\' 11"  (1.803 m), weight 68.2 kg (150 lb 4 oz), SpO2 100 %.  PHYSICAL EXAMINATION:  Physical Exam  GENERAL:  42 y.o.-year-old patient lying in the bed with no acute distress.  EYES: Pupils equal, round, reactive to light and accommodation. No scleral icterus. Extraocular muscles intact.  HEENT: Head atraumatic, normocephalic. Oropharynx and nasopharynx clear.  NECK:  Supple, no jugular venous distention. No thyroid enlargement, no tenderness.  LUNGS: Normal breath sounds bilaterally, no wheezing, rales,rhonchi or crepitation. No use of accessory muscles of respiration.  CARDIOVASCULAR: S1, S2 normal. No murmurs, rubs, or gallops.  ABDOMEN: Soft, nontender, nondistended. Bowel sounds present. No organomegaly or mass.    EXTREMITIES: No pedal edema, cyanosis, or clubbing.  NEUROLOGIC: Cranial nerves II through XII are intact. Muscle strength 5/5 in all extremities. Sensation intact. Gait not checked.  PSYCHIATRIC: The patient is alert and oriented x 3.  SKIN: No obvious rash, lesion, or ulcer.    LABORATORY PANEL:   CBC Recent Labs  Lab 06/16/17 1014  WBC 14.5*  HGB 12.8*  HCT 36.5*  PLT 162   ------------------------------------------------------------------------------------------------------------------  Chemistries  No results for input(s): NA, K, CL, CO2, GLUCOSE, BUN, CREATININE, CALCIUM, MG, AST, ALT, ALKPHOS, BILITOT in the last 168 hours.  Invalid input(s): GFRCGP ------------------------------------------------------------------------------------------------------------------  Cardiac Enzymes No results for input(s): TROPONINI in the last 168 hours. ------------------------------------------------------------------------------------------------------------------  RADIOLOGY:  Dg Chest 2 View  Result Date: 06/15/2017 CLINICAL DATA:  Pneumonia EXAM: CHEST - 2 VIEW COMPARISON:  10/07/2016 FINDINGS: Heart and mediastinal contours are within normal limits. No focal opacities or effusions. No acute bony abnormality. IMPRESSION: No active cardiopulmonary disease. Electronically Signed   By: Rolm Baptise M.D.   On: 06/15/2017 13:18    EKG:   Orders placed or performed during the hospital encounter of 06/07/17  . EKG 12-Lead  . EKG 12-Lead    ASSESSMENT AND PLAN:   42 year old male with past medical history significant for schizoaffective disorder, polysubstance abuse admitted to the hospital secondary to hallucinations.  1. Fever- viral syndrome, no further fevers - CXR clear, supportive treatment - no need for antibiotics, monitor  2. Schizoaffective bipolar disorder-management per psychiatry. -Currently on Depakote, clozapine, inVega. Also on propranolol  3. Polysubstance  abuse-counseled. Not interested in inpatient treatment at this time.  Marland Kitchen GERD-Protonix  Medically stable. We will sign off. Consult if needed  All the records are reviewed and case  discussed with Care Management/Social Workerr. Management plans discussed with the patient, family and they are in agreement.  CODE STATUS: full code  TOTAL TIME TAKING CARE OF THIS PATIENT: 37 minutes.   POSSIBLE D/C IN 1-2 DAYS, DEPENDING ON CLINICAL CONDITION.   Gladstone Lighter M.D on 06/16/2017 at 1:24 PM  Between 7am to 6pm - Pager - [phone]  After 6pm go to [email or web address] - password [email or web address] [facility]  Office  [phone]  CC: Primary care physician; Ellamae Sia, MD

## 2017-06-16 NOTE — BHH Group Notes (Signed)
[facility] Group Notes:  (Nursing/MHT/Case Management/Adjunct)  Date:  06/16/2017  Time:  3:04 PM  Type of Therapy:  Psychoeducational Skills  Participation Level:  Active  Participation Quality:  Appropriate  Affect:  Appropriate  Cognitive:  Appropriate  Insight:  Appropriate  Engagement in Group:  Engaged  Modes of Intervention:  Socialization  Summary of Progress/Problems:  Isaiah Peterson 06/16/2017, 3:04 PM

## 2017-06-16 NOTE — Progress Notes (Signed)
Patient ID: Isaiah Peterson., male   DOB: 02-21-1976, 42 y.o.   MRN: [id number] PER STATE REGULATIONS 482.30  THIS CHART WAS REVIEWED FOR MEDICAL NECESSITY WITH RESPECT TO THE PATIENT'S ADMISSION/ DURATION OF STAY.  NEXT REVIEW DATE:  06/19/2017  Chauncy Lean, RN, BSN CASE MANAGER

## 2017-06-16 NOTE — Plan of Care (Addendum)
Patient found in room upon my arrival. Patient is visible and somewhat social this evening. Reports he is being discharged tomorrow and feels ready for discharge. Affect and mood are brighter. Denies all complaints. Compliant with HS medications and staff direction. Q 15 minute checks maintained. Will continue to monitor throughout the shift.  Patient slept 6.25 hours. No apparent distress. Will endorse care to oncoming shift.  Progressing Anxiety Demonstrates healthy coping skills 06/16/2017 2244 - Progressing by Derek Mound, RN Activity: Interest or engagement in activities will improve 06/16/2017 2244 - Progressing by Derek Mound, RN Sleeping patterns will improve 06/16/2017 2244 - Progressing by Derek Mound, RN Sleeping patterns will improve 06/16/2017 2244 - Progressing by Derek Mound, RN Education: Knowledge of [place] Education information/materials will improve 06/16/2017 2244 - Progressing by Derek Mound, RN Mental status will improve 06/16/2017 2244 - Progressing by Derek Mound, RN Coping: Ability to verbalize frustrations and anger appropriately will improve 06/16/2017 2244 - Progressing by Derek Mound, RN Ability to demonstrate self-control will improve 06/16/2017 2244 - Progressing by Derek Mound, RN Activity: Interest or engagement in leisure activities will improve 06/16/2017 2244 - Progressing by Derek Mound, RN Education: Utilization of techniques to improve thought processes will improve 06/16/2017 2244 - Progressing by Derek Mound, RN Education: Knowledge of [place] Education information will improve 06/16/2017 2244 - Progressing by Derek Mound, RN Health Behavior/Discharge Planning: Ability to manage health-related needs will improve 06/16/2017 2244 - Progressing by Derek Mound, RN Activity: Risk for activity intolerance will decrease 06/16/2017 2244 - Progressing by Derek Mound, RN Coping: Level of anxiety  will decrease 06/16/2017 2244 - Progressing by Derek Mound, RN Pain Managment: General experience of comfort will improve 06/16/2017 2244 - Progressing by Derek Mound, RN Safety: Ability to remain free from injury will improve 06/16/2017 2244 - Progressing by Derek Mound, RN

## 2017-06-16 NOTE — Consult Note (Signed)
[facility] for Clozapine Lab Monitoring and REMs reporting   Indication: Schizophrenia   Allergies  Allergen Reactions  . Haldol [Haloperidol Lactate] Other (See Comments)    Tardive dyskinesia.   Patient Measurements: Height: 5\' 11"  (180.3 cm) Weight: 150 lb 4 oz (68.2 kg) IBW/kg (Calculated) : 75.3  Vital Signs: Temp: 98 F (36.7 C) (03/11 0607) Temp Source: Oral (03/11 0607) BP: 137/83 (03/11 1151) Pulse Rate: 113 (03/11 1151)  Labs: Recent Labs    06/16/17 1014  WBC 14.5*  HGB 12.8*  HCT 36.5*  PLT 162   Estimated Creatinine Clearance: 90.2 mL/min (by C-G formula based on SCr of 1.05 mg/dL).  Medical History: Past Medical History:  Diagnosis Date  . Bipolar 1 disorder ([place])   . Gallstones   . Pancreatitis    questionable mild biliary pancreatitis  . Schizophrenia, schizo-affective [facility])     Assessment: Pharmacy consulted for lab monitoring and Clozapine REMs program reporting in 42yo male being initiated on clozapine for Schizophrenia.   3/4  [place] 5500 3/11 ANC 11.4   Plan:  Lab was reported to online REMs program. Patient is eligible to receive clozapine with weekly lab monitoring and reporting.  Check ANC at least weekly while inpatient. Next labs ordered for 06/23/17.  Rocky Morel, PharmD, BCPS Clinical Pharmacist 06/16/2017 11:57 AM

## 2017-06-16 NOTE — Plan of Care (Signed)
Patient has been demonstrating healthy coping skills on the unit today. Patient's risk for activity intolerance has decreased by interacting well with other members on the unit without any issues thus far. Patient states that he slept good last night and has not been sleeping throughout the day today. Patient verbalizes understanding of the general information that has been provided to him and has not voiced any further questions or concerns at this time. Patient denies SI/HI/AVH as well as any signs/symptoms of depression/anxiety at this time. Patient has been demonstrating self-control on the unit thus far. Patient has the ability to manage health-related needs and his overall comfort level has improved. Patient has remained free from injury and remains safe on the unit at this time.

## 2017-06-16 NOTE — Progress Notes (Signed)
D- Patient alert and oriented. Patient presents in a pleasant mood on assessment stating that he slept good last night and has no complaints at this time. Patient denies SI, HI, AVH, and pain at this time. Patient also denies any signs/symptoms of depression/anxiety at this time. Patient's goal for today is to "talk to the doctor about discharge".  A- Scheduled medications administered to patient, per MD orders. Support and encouragement provided.  Routine safety checks conducted every 15 minutes.  Patient informed to notify staff with problems or concerns.  R- No adverse drug reactions noted. Patient contracts for safety at this time. Patient compliant with medications and treatment plan. Patient receptive, calm, and cooperative. Patient interacts well with others on the unit.  Patient remains safe at this time.

## 2017-06-16 NOTE — Progress Notes (Addendum)
[facility] MD Progress Note  06/16/2017 2:22 PM Isaiah Peterson.  MRN:  [id number]  Subjective:  Isaiah Peterson has no complaints. Auditory hallucination are gone. Mood is good affect brighter. Tolerates medications well. Sleep is good, appetite fair.  Treatment plan. We will continue monthly Invega sustenna injections, Clozapine 200 mg nightly and Depakote 1000 mg nightly. Continues to sleep poorly in spite of Trazodone.   Social/disposition. He will be discharged to home with family. Follow up with [facility] team if possible.  Principal Problem: Schizoaffective disorder, bipolar type without good prognostic features ([place]) Diagnosis:   Patient Active Problem List   Diagnosis Date Noted  . Schizoaffective disorder, bipolar type without good prognostic features ([place]) [F25.0] 06/07/2017    Priority: High  . Tobacco use disorder [F17.200] 06/08/2017  . GERD (gastroesophageal reflux disease) [K21.9] 06/08/2017  . Cannabis abuse [F12.10]   . Dysphagia [R13.10] 05/28/2012  . Abdominal pain, chronic, epigastric [R10.13, G89.29] 05/28/2012  . Gallstones [K80.20] 08/06/2011  . Pancreatitis [K85.90] 08/06/2011  . Cannabis use disorder, moderate, dependence ([place]) [F12.20] 06/10/2011  . Schizophrenia, paranoid, chronic ([place]) [F20.0] 06/08/2011    Class: Acute   Total Time spent with patient: 30 minutes  Past Psychiatric History: schizophrenia  Past Medical History:  Past Medical History:  Diagnosis Date  . Bipolar 1 disorder ([place])   . Gallstones   . Pancreatitis    questionable mild biliary pancreatitis  . Schizophrenia, schizo-affective ([place])     Past Surgical History:  Procedure Laterality Date  . APPENDECTOMY    . CHOLECYSTECTOMY  02/28/2012   Procedure: LAPAROSCOPIC CHOLECYSTECTOMY;  Surgeon: Jamesetta So, MD;  Location: [facility];  Service: General;  Laterality: N/A;  . ESOPHAGOGASTRODUODENOSCOPY  07/11/2006   RMR: Esophageal foreign body (chicken bone), status post removal,  as described above.  Complete esophagogastroduodenoscopy not carried out  . MULTIPLE TOOTH EXTRACTIONS     due to decay, nerve damage, edentulous   Family History:  Family History  Problem Relation Age of Onset  . Aneurysm Mother        brain  . Liver disease Mother   . Colon cancer Maternal Uncle        great uncle  . Pancreatitis Maternal Uncle        died age 58  . Stomach cancer Maternal Aunt        great aunt   Family Psychiatric  History: none reported Social History:  Social History   Substance and Sexual Activity  Alcohol Use No   Comment: former quit about 10 years ago     Social History   Substance and Sexual Activity  Drug Use Yes  . Types: Marijuana   Comment: marijuana end of Jan 2014     Social History   Socioeconomic History  . Marital status: Single    Spouse name: None  . Number of children: None  . Years of education: None  . Highest education level: None  Social Needs  . Financial resource strain: None  . Food insecurity - worry: None  . Food insecurity - inability: None  . Transportation needs - medical: None  . Transportation needs - non-medical: None  Occupational History  . None  Tobacco Use  . Smoking status: Current Every Day Smoker    Packs/day: 1.50    Years: 20.00    Pack years: 30.00    Types: Cigarettes  . Smokeless tobacco: Never Used  Substance and Sexual Activity  . Alcohol use: No  Comment: former quit about 10 years ago  . Drug use: Yes    Types: Marijuana    Comment: marijuana end of Jan 2014   . Sexual activity: Yes  Other Topics Concern  . None  Social History Narrative  . None   Additional Social History:                         Sleep: Poor  Appetite:  Poor  Current Medications: Current Facility-Administered Medications  Medication Dose Route Frequency Provider Last Rate Last Dose  . acetaminophen (TYLENOL) tablet 650 mg  650 mg Oral Q6H PRN Chauncey Mann, MD      . alum & mag  hydroxide-simeth (MAALOX/MYLANTA) 200-200-20 MG/5ML suspension 30 mL  30 mL Oral Q4H PRN Chauncey Mann, MD      . cloZAPine (CLOZARIL) tablet 200 mg  200 mg Oral QHS Harm Jou B, MD   200 mg at 06/15/17 2128  . divalproex (DEPAKOTE ER) 24 hr tablet 1,000 mg  1,000 mg Oral QHS Chauncey Mann, MD   1,000 mg at 06/15/17 2129  . docusate sodium (COLACE) capsule 100 mg  100 mg Oral Daily Saylah Ketner B, MD   100 mg at 06/15/17 0801  . feeding supplement (ENSURE ENLIVE) (ENSURE ENLIVE) liquid 237 mL  237 mL Oral TID BM Teara Duerksen B, MD   237 mL at 06/16/17 1100  . magic mouthwash  5 mL Oral TID Chauncey Mann, MD   5 mL at 06/16/17 1153  . magnesium hydroxide (MILK OF MAGNESIA) suspension 30 mL  30 mL Oral Daily PRN Chauncey Mann, MD      . nicotine (NICODERM CQ - dosed in mg/24 hours) patch 14 mg  14 mg Transdermal Daily Chauncey Mann, MD   14 mg at 06/16/17 0830  . pantoprazole (PROTONIX) EC tablet 40 mg  40 mg Oral Daily Chauncey Mann, MD   40 mg at 06/16/17 0829  . polyethylene glycol (MIRALAX / GLYCOLAX) packet 17 g  17 g Oral Daily Paizleigh Wilds B, MD   17 g at 06/15/17 0801  . propranolol (INDERAL) tablet 10 mg  10 mg Oral TID Chauncey Mann, MD   10 mg at 06/16/17 1153  . traZODone (DESYREL) tablet 100 mg  100 mg Oral QHS Jaimy Kliethermes B, MD   100 mg at 06/15/17 2129    Lab Results:  Results for orders placed or performed during the hospital encounter of 06/07/17 (from the past 48 hour(s))  Influenza panel by PCR (type A & B)     Status: None   Collection Time: 06/15/17 10:08 AM  Result Value Ref Range   Influenza A By PCR NEGATIVE NEGATIVE   Influenza B By PCR NEGATIVE NEGATIVE    Comment: (NOTE) The Xpert Xpress Flu assay is intended as an aid in the diagnosis of  influenza and should not be used as a sole basis for treatment.  This  assay is FDA approved for nasopharyngeal swab specimens only. Nasal  washings and aspirates are unacceptable for  Xpert Xpress Flu testing. Performed at [facility], [address] [place], [place] [address]   CBC with Differential/Platelet     Status: Abnormal   Collection Time: 06/16/17 10:14 AM  Result Value Ref Range   WBC 14.5 (H) 3.8 - 10.6 K/uL   RBC 3.95 (L) 4.40 - 5.90 MIL/uL   Hemoglobin 12.8 (L) 13.0 - 18.0 g/dL  HCT 36.5 (L) 40.0 - 52.0 %   MCV 92.5 80.0 - 100.0 fL   MCH 32.3 26.0 - 34.0 pg   MCHC 35.0 32.0 - 36.0 g/dL   RDW 12.0 11.5 - 14.5 %   Platelets 162 150 - 440 K/uL   Neutrophils Relative % 78 %   Lymphocytes Relative 7 %   Monocytes Relative 14 %   Eosinophils Relative 1 %   Basophils Relative 0 %   Neutro Abs 11.4 (H) 1.4 - 6.5 K/uL   Lymphs Abs 1.0 1.0 - 3.6 K/uL   Monocytes Absolute 2.0 (H) 0.2 - 1.0 K/uL   Eosinophils Absolute 0.1 0 - 0.7 K/uL   Basophils Absolute 0.0 0 - 0.1 K/uL   RBC Morphology BASOPHILIC STIPPLING     Comment: POLYCHROMASIA PRESENT Performed at [facility], [address] [place], [place] [address]     Blood Alcohol level:  Lab Results  Component Value Date   [facility] <10 06/06/2017   ETH <11 19/14/7829    Metabolic Disorder Labs: Lab Results  Component Value Date   HGBA1C 4.6 (L) 06/08/2017   MPG 85.32 06/08/2017   MPG 108 06/11/2011   Lab Results  Component Value Date   PROLACTIN 48.5 (H) 06/08/2017   Lab Results  Component Value Date   CHOL 156 06/08/2017   TRIG 252 (H) 06/08/2017   HDL 31 (L) 06/08/2017   CHOLHDL 5.0 06/08/2017   VLDL 50 (H) 06/08/2017   LDLCALC 75 06/08/2017   LDLCALC 63 06/11/2011    Physical Findings: AIMS:  , ,  ,  ,    CIWA:    COWS:     Musculoskeletal: Strength & Muscle Tone: within normal limits Gait & Station: normal Patient leans: N/A  Psychiatric Specialty Exam: Physical Exam  Nursing note and vitals reviewed. Psychiatric: He has a normal mood and affect. His speech is normal and behavior is normal. Judgment and thought content normal. Cognition and memory  are normal.    Review of Systems  Neurological: Negative.   Psychiatric/Behavioral: Negative.   All other systems reviewed and are negative.   Blood pressure 137/83, pulse (!) 113, temperature 98 F (36.7 C), temperature source Oral, resp. rate 16, height 5\' 11"  (1.803 m), weight 68.2 kg (150 lb 4 oz), SpO2 100 %.Body mass index is 20.96 kg/m.  General Appearance: Casual  Eye Contact:  Good  Speech:  Clear and Coherent  Volume:  Normal  Mood:  Euthymic  Affect:  Blunt  Thought Process:  Goal Directed and Descriptions of Associations: Intact  Orientation:  Full (Time, Place, and Person)  Thought Content:  WDL  Suicidal Thoughts:  No  Homicidal Thoughts:  No  Memory:  Immediate;   Fair Recent;   Fair Remote;   Fair  Judgement:  Fair  Insight:  Present  Psychomotor Activity:  Normal  Concentration:  Concentration: Fair and Attention Span: Fair  Recall:  [employer] of Knowledge:  Fair  Language:  Fair  Akathisia:  No  Handed:  Right  AIMS (if indicated):     Assets:  Communication Skills Desire for Improvement Financial Resources/Insurance Housing Physical Health Resilience Social Support  ADL's:  Intact  Cognition:  WNL  Sleep:  Number of Hours: 3.45     Treatment Plan Summary: Daily contact with patient to assess and evaluate symptoms and progress in treatment and Medication management   Mr. Camps is a 42 year old male with a diagnosis of schizoaffective disorder who came to  the emergency room under IVC endorsing suicidal and homicidal thoughts in the context of active psychotic symptoms. He attempted to buy a gun illegally prior to admission.   #Schizoaffective disorder bipolar type -continue Depakote ER 1000 mg nightly VPA level 83 on 2/4 -continue [place] monthly injections, last injection given 05/30/17 -increase Propanolol 20 mg po TIDto help with akathesia and tachycardia -continue Clozapine 200 mg nightly   #Insomnia -discontinue Trazodone   -start Restoril 15 mg nightly  #Cannabis use disorder -patient was advised to abstain from marijuana and all illicit drugs as they may worsen mood symptoms. -at this time, he is not interested in substance abuse treatment  #Nicotine use disorder -nicotine patch is available - he was advised to stop smoking completely as it may worsen his health  #Poor oral intake -poor dentition, soft diet -Ensure  #Metabolic syndrome monitoring -lipid panel shows elevated TG, TSH is 5.3, Normal HgbA1C -EKG, QTc431  #Admission status -voluntary admission  #Disposition -discharge to home with the mother -follow up with Dr. Isabelle Course.     Orson Slick, MD 06/16/2017, 2:22 PM

## 2017-06-16 NOTE — Plan of Care (Signed)
Pt has been visible in milieu interacting with peers. Pt denies ah/vh/si/hi/and pain. No new complaints this shift. Pt is medication compliant. Pt remains on Q 15 mins safety rounds. Will cont to monitor pt. Progressing Anxiety Demonstrates healthy coping skills 06/16/2017 0050 - Progressing by Corinna Capra, RN Activity: Interest or engagement in activities will improve 06/16/2017 0050 - Progressing by Corinna Capra, RN Sleeping patterns will improve 06/16/2017 0050 - Progressing by Corinna Capra, RN Sleeping patterns will improve 06/16/2017 0050 - Progressing by Corinna Capra, RN Education: Knowledge of [place] Education information/materials will improve 06/16/2017 0050 - Progressing by Corinna Capra, RN Mental status will improve 06/16/2017 0050 - Progressing by Corinna Capra, RN Coping: Ability to verbalize frustrations and anger appropriately will improve 06/16/2017 0050 - Progressing by Corinna Capra, RN Ability to demonstrate self-control will improve 06/16/2017 0050 - Progressing by Corinna Capra, RN Activity: Interest or engagement in leisure activities will improve 06/16/2017 0050 - Progressing by Corinna Capra, RN Education: Utilization of techniques to improve thought processes will improve 06/16/2017 0050 - Progressing by Corinna Capra, RN Education: Knowledge of General Education information will improve 06/16/2017 0050 - Progressing by Corinna Capra, RN Health Behavior/Discharge Planning: Ability to manage health-related needs will improve 06/16/2017 0050 - Progressing by Corinna Capra, RN Activity: Risk for activity intolerance will decrease 06/16/2017 0050 - Progressing by Corinna Capra, RN Coping: Level of anxiety will decrease 06/16/2017 0050 - Progressing by Corinna Capra, RN Pain Managment: General experience of comfort will improve 06/16/2017 0050 - Progressing by Corinna Capra, RN Safety: Ability to remain free from injury  will improve 06/16/2017 0050 - Progressing by Corinna Capra, RN

## 2017-06-16 NOTE — Consult Note (Signed)
MEDICATION RELATED CONSULT NOTE - INITIAL   Pharmacy Consult for Clozapine Lab Monitoring and REMs reporting   Indication: Schizophrenia   Allergies  Allergen Reactions  . Haldol [Haloperidol Lactate] Other (See Comments)    Tardive dyskinesia.   Patient Measurements: Height: 5\' 11"  (180.3 cm) Weight: 150 lb 4 oz (68.2 kg) IBW/kg (Calculated) : 75.3  Vital Signs: Temp: 98 F (36.7 C) (03/11 0607) Temp Source: Oral (03/11 0607) BP: 145/80 (03/11 0607) Pulse Rate: 110 (03/11 0607)  Labs: No results for input(s): WBC, HGB, HCT, PLT, APTT, CREATININE, LABCREA, CREATININE, CREAT24HRUR, MG, PHOS, ALBUMIN, PROT, ALBUMIN, AST, ALT, ALKPHOS, BILITOT, BILIDIR, IBILI in the last 72 hours. Estimated Creatinine Clearance: 90.2 mL/min (by C-G formula based on SCr of 1.05 mg/dL).  Medical History: Past Medical History:  Diagnosis Date  . Bipolar 1 disorder ([place])   . Gallstones   . Pancreatitis    questionable mild biliary pancreatitis  . Schizophrenia, schizo-affective [facility])     Assessment: Pharmacy consulted for lab monitoring and Clozapine REMs program reporting in 42yo male being initiated on clozapine for Schizophrenia.   Plan:  [place] today 8889. Lab was reported to online REMs program. Patient is eligible to receive clozapine with weekly lab monitoring and reporting.  Check ANC at least weekly while inpatient.  03/11  [place]  11,400 Next labs ordered for 06/23/17.  Prudy Feeler, RPh  06/16/2017 10:44 AM

## 2017-06-16 NOTE — BHH Group Notes (Signed)
[place] Group Notes:  (Nursing/MHT/Case Management/Adjunct)  Date:  06/16/2017  Time:  9:03 PM  Type of Therapy:  Group Therapy  Participation Level:  Active  Participation Quality:  Appropriate  Affect:  Appropriate  Cognitive:  Appropriate  Insight:  Appropriate  Engagement in Group:  Engaged  Modes of Intervention:  Discussion  Summary of Progress/Problems:  Isaiah Peterson 06/16/2017, 9:03 PM

## 2017-06-17 NOTE — Progress Notes (Signed)
D: Patient is aware of  Discharge this shift .Patient denies suicidal /homicidal ideations. Patient received all belongings brought in   A: No Storage medications. Writer reviewed Discharge Summary, Suicide Risk Assessment, and Transitional Record. Patient also received Prescriptions   from  MD. Of follow up appointment .  R: Patient left unit with no questions  Or concerns  With family
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
## 2017-06-17 NOTE — BHH Group Notes (Signed)
  06/17/2017  Time: 0900  Type of Therapy and Topic: Group Therapy: Goals Group: SMART Goals   Participation Level:  Active   Description of Group:   The purpose of a daily goals group is to assist and guide patients in setting recovery/wellness-related goals. The objective is to set goals as they relate to the crisis in which they were admitted. Patients will be using SMART goal modalities to set measurable goals. Characteristics of realistic goals will be discussed and patients will be assisted in setting and processing how one will reach their goal. Facilitator will also assist patients in applying interventions and coping skills learned in psycho-education groups to the SMART goal and process how one will achieve defined goal.   Therapeutic Goals:  -Patients will develop and document one goal related to or their crisis in which brought them into treatment.  -Patients will be guided by LCSW using SMART goal setting modality in how to set a measurable, attainable, realistic and time sensitive goal.  -Patients will process barriers in reaching goal.  -Patients will process interventions in how to overcome and successful in reaching goal.   Patient's Goal:  Pt continues to work towards their tx goals but has not yet reached them. Pt was able to appropriately participate in group discussion, and was able to offer support/validation to other group members.  Pt reported his goal for the day is to, "call [facility] at least once by the end of the day to set up follow up appointment."   Therapeutic Modalities:  Motivational Interviewing  Cognitive Behavioral Therapy  Crisis Intervention Model  SMART goals setting   Alden Hipp, MSW, LCSW 06/17/2017 9:40 AM

## 2017-06-17 NOTE — BHH Group Notes (Signed)
06/17/2017 1PM  Type of Therapy/Topic:  Group Therapy:  Feelings about Diagnosis  Participation Level:  Did Not Attend   Description of Group:   This group will allow patients to explore their thoughts and feelings about diagnoses they have received. Patients will be guided to explore their level of understanding and acceptance of these diagnoses. Facilitator will encourage patients to process their thoughts and feelings about the reactions of others to their diagnosis and will guide patients in identifying ways to discuss their diagnosis with significant others in their lives. This group will be process-oriented, with patients participating in exploration of their own experiences, giving and receiving support, and processing challenge from other group members.   Therapeutic Goals: 1. Patient will demonstrate understanding of diagnosis as evidenced by identifying two or more symptoms of the disorder 2. Patient will be able to express two feelings regarding the diagnosis 3. Patient will demonstrate their ability to communicate their needs through discussion and/or role play  Summary of Patient Progress: Patient was encouraged and invited to attend group. Patient did not attend group. Social worker will continue to encourage group participation in the future.        Therapeutic Modalities:   Cognitive Behavioral Therapy Brief Therapy Feelings Identification    Hetty Blend 06/17/2017 2:33 PM

## 2017-06-17 NOTE — B[facility] Suicide Risk Assessment (Signed)
[facility] Discharge Suicide Risk Assessment   Principal Problem: Schizoaffective disorder, bipolar type without good prognostic features [facility]) Discharge Diagnoses:  Patient Active Problem List   Diagnosis Date Noted  . Schizoaffective disorder, bipolar type without good prognostic features ([place]) [F25.0] 06/07/2017    Priority: High  . Tobacco use disorder [F17.200] 06/08/2017  . GERD (gastroesophageal reflux disease) [K21.9] 06/08/2017  . Cannabis abuse [F12.10]   . Dysphagia [R13.10] 05/28/2012  . Abdominal pain, chronic, epigastric [R10.13, G89.29] 05/28/2012  . Gallstones [K80.20] 08/06/2011  . Pancreatitis [K85.90] 08/06/2011  . Cannabis use disorder, moderate, dependence ([place]) [F12.20] 06/10/2011  . Schizophrenia, paranoid, chronic ([place]) [F20.0] 06/08/2011    Class: Acute    Total Time spent with patient: 35  Musculoskeletal: Strength & Muscle Tone: within normal limits Gait & Station: normal Patient leans: N/A  Psychiatric Specialty Exam: Review of Systems  Neurological: Negative.   Psychiatric/Behavioral: Negative.   All other systems reviewed and are negative.   Blood pressure 134/76, pulse (!) 109, temperature 99.1 F (37.3 C), temperature source Oral, resp. rate 16, height 5\' 11"  (1.803 m), weight 68.2 kg (150 lb 4 oz), SpO2 100 %.Body mass index is 20.96 kg/m.  General Appearance: Casual  Eye Contact::  Good  Speech:  Clear and Coherent409  Volume:  Normal  Mood:  Euthymic  Affect:  Blunt  Thought Process:  Goal Directed and Descriptions of Associations: Intact  Orientation:  Full (Time, Place, and Person)  Thought Content:  WDL  Suicidal Thoughts:  No  Homicidal Thoughts:  No  Memory:  Immediate;   Fair Recent;   Fair Remote;   Fair  Judgement:  Fair  Insight:  Present  Psychomotor Activity:  Normal  Concentration:  Fair  Recall:  [employer] of [place]  Language: Fair  Akathisia:  No  Handed:  Right  AIMS (if indicated):     Assets:   Communication Skills Desire for Improvement Financial Resources/Insurance Housing Physical Health Resilience Social Support  Sleep:  Number of Hours: 6.25  Cognition: WNL  ADL's:  Intact   Mental Status Per Nursing Assessment::   On Admission:  NA  Demographic Factors:  Male and Caucasian  Loss Factors: NA  Historical Factors: Impulsivity  Risk Reduction Factors:   Sense of responsibility to family, Living with another person, especially a relative, Positive social support and Positive therapeutic relationship  Continued Clinical Symptoms:  Schizophrenia:   Less than 65 years old Paranoid or undifferentiated type  Cognitive Features That Contribute To Risk:  None    Suicide Risk:  Minimal: No identifiable suicidal ideation.  Patients presenting with no risk factors but with morbid ruminations; may be classified as minimal risk based on the severity of the depressive symptoms  Follow-up Information    [place] on 06/18/2017.   Why:  9:30am, Dr. Lita Mains for Hospital Follow up. Contact information: [place] [address] [phone]           Plan Of Care/Follow-up recommendations:  Activity:  as tolerated Diet:  low sodium hrat healthy Other:  keep follow up appointments  Orson Slick, MD 06/17/2017, 10:21 AM

## 2017-06-17 NOTE — Progress Notes (Signed)
Patient ID: Isaiah Peterson., male   DOB: [birth year], 42 y.o.   MRN: [id number] CSW spoke with [name], Nurse at [facility] on speaker phone to make sure that all parties were on the same page at discharge.  RN  Isaiah Peterson at [facility] reiterated importance of getting labs done weekly and that they would need to have them in order to get medications from pharmacy. RN Isaiah Peterson informed her that Pt's provider would change as Dr. Ernie Hew is the only physician that prescribes Clozaril at [facility].  [name] was agreeable to getting Pt to appointments and agreed to assist with all transportation arrangements. CSW provided list of [facility] locations in [place], times they were open and phone numbers for them to arrange appointment as appriopriate to Dr's orders on chart at discharge.  She verbalizes understanding of plan.    Isaiah Arbour, LCSW

## 2017-06-17 NOTE — Progress Notes (Signed)
  [facility] Adult Case Management Discharge Plan :  Will you be returning to the same living situation after discharge:  Yes,    At discharge, do you have transportation home?: Yes,    Do you have the ability to pay for your medications: Yes,     Release of information consent forms completed and in the chart;  Patient's signature needed at discharge.  Patient to Follow up at: Follow-up Information    [facility] on 06/18/2017.   Why:  9:30am, Dr. Lita Mains for Hospital Follow up. Contact information: [place] [address] [phone]           Next level of care provider has access to [facility] Link:No  Safety Planning and Suicide Prevention discussed: Yes,     Have you used any form of tobacco in the last 30 days? (Cigarettes, Smokeless Tobacco, Cigars, and/or Pipes): Yes  Has patient been referred to the Quitline?: Patient refused referral  Patient has been referred for addiction treatment:  Yes  August Saucer, LCSW 06/17/2017, 1:23 PM

## 2017-07-03 ENCOUNTER — Other Ambulatory Visit: Payer: Self-pay

## 2017-07-04 LAB — CBC WITH DIFFERENTIAL/PLATELET
BASOS ABS: 0 10*3/uL (ref 0.0–0.2)
Basos: 0 %
EOS (ABSOLUTE): 0.3 10*3/uL (ref 0.0–0.4)
Eos: 3 %
HEMATOCRIT: 41.3 % (ref 37.5–51.0)
HEMOGLOBIN: 13.5 g/dL (ref 13.0–17.7)
Immature Grans (Abs): 0 10*3/uL (ref 0.0–0.1)
Immature Granulocytes: 0 %
LYMPHS ABS: 2.1 10*3/uL (ref 0.7–3.1)
Lymphs: 23 %
MCH: 32.4 pg (ref 26.6–33.0)
MCHC: 32.7 g/dL (ref 31.5–35.7)
MCV: 99 fL — ABNORMAL HIGH (ref 79–97)
Monocytes Absolute: 1 10*3/uL — ABNORMAL HIGH (ref 0.1–0.9)
Monocytes: 11 %
NEUTROS ABS: 5.8 10*3/uL (ref 1.4–7.0)
Neutrophils: 63 %
Platelets: 174 10*3/uL (ref 150–379)
RBC: 4.17 x10E6/uL (ref 4.14–5.80)
RDW: 13.2 % (ref 12.3–15.4)
WBC: 9.2 10*3/uL (ref 3.4–10.8)

## 2017-07-10 ENCOUNTER — Other Ambulatory Visit: Payer: Self-pay

## 2017-07-10 LAB — CBC WITH DIFFERENTIAL/PLATELET
BASOS: 0 %
Basophils Absolute: 0 10*3/uL (ref 0.0–0.2)
EOS (ABSOLUTE): 0.2 10*3/uL (ref 0.0–0.4)
Eos: 2 %
Hematocrit: 37 % — ABNORMAL LOW (ref 37.5–51.0)
Hemoglobin: 13.4 g/dL (ref 13.0–17.7)
Immature Grans (Abs): 0 10*3/uL (ref 0.0–0.1)
Immature Granulocytes: 0 %
LYMPHS ABS: 1.5 10*3/uL (ref 0.7–3.1)
Lymphs: 14 %
MCH: 33.6 pg — AB (ref 26.6–33.0)
MCHC: 36.2 g/dL — ABNORMAL HIGH (ref 31.5–35.7)
MCV: 93 fL (ref 79–97)
MONOS ABS: 1.4 10*3/uL — AB (ref 0.1–0.9)
Monocytes: 13 %
NEUTROS ABS: 7.9 10*3/uL — AB (ref 1.4–7.0)
Neutrophils: 71 %
Platelets: 170 10*3/uL (ref 150–379)
RBC: 3.99 x10E6/uL — ABNORMAL LOW (ref 4.14–5.80)
RDW: 11.9 % — AB (ref 12.3–15.4)
WBC: 11 10*3/uL — ABNORMAL HIGH (ref 3.4–10.8)

## 2017-10-14 ENCOUNTER — Encounter: Payer: Self-pay | Admitting: Emergency Medicine

## 2017-10-14 ENCOUNTER — Emergency Department: Payer: Medicare Other

## 2017-10-14 ENCOUNTER — Other Ambulatory Visit: Payer: Self-pay

## 2017-10-14 ENCOUNTER — Inpatient Hospital Stay
Admission: EM | Admit: 2017-10-14 | Discharge: 2017-10-16 | DRG: 071 | Disposition: A | Discharge status: Home / Self Care | Payer: Medicare Other | Attending: Internal Medicine | Admitting: Internal Medicine

## 2017-10-14 DIAGNOSIS — K219 Gastro-esophageal reflux disease without esophagitis: Secondary | ICD-10-CM | POA: Diagnosis present

## 2017-10-14 DIAGNOSIS — Y92009 Unspecified place in unspecified non-institutional (private) residence as the place of occurrence of the external cause: Secondary | ICD-10-CM

## 2017-10-14 DIAGNOSIS — D696 Thrombocytopenia, unspecified: Secondary | ICD-10-CM | POA: Diagnosis present

## 2017-10-14 DIAGNOSIS — F17210 Nicotine dependence, cigarettes, uncomplicated: Secondary | ICD-10-CM | POA: Diagnosis present

## 2017-10-14 DIAGNOSIS — R4182 Altered mental status, unspecified: Secondary | ICD-10-CM | POA: Diagnosis present

## 2017-10-14 DIAGNOSIS — F319 Bipolar disorder, unspecified: Secondary | ICD-10-CM | POA: Diagnosis present

## 2017-10-14 DIAGNOSIS — T426X5A Adverse effect of other antiepileptic and sedative-hypnotic drugs, initial encounter: Secondary | ICD-10-CM | POA: Diagnosis present

## 2017-10-14 DIAGNOSIS — E7220 Disorder of urea cycle metabolism, unspecified: Secondary | ICD-10-CM | POA: Diagnosis present

## 2017-10-14 DIAGNOSIS — Z800 Family history of malignant neoplasm of digestive organs: Secondary | ICD-10-CM | POA: Diagnosis not present

## 2017-10-14 DIAGNOSIS — Z9049 Acquired absence of other specified parts of digestive tract: Secondary | ICD-10-CM

## 2017-10-14 DIAGNOSIS — Z888 Allergy status to other drugs, medicaments and biological substances status: Secondary | ICD-10-CM | POA: Diagnosis not present

## 2017-10-14 DIAGNOSIS — R410 Disorientation, unspecified: Secondary | ICD-10-CM

## 2017-10-14 DIAGNOSIS — R7301 Impaired fasting glucose: Secondary | ICD-10-CM | POA: Diagnosis present

## 2017-10-14 DIAGNOSIS — F259 Schizoaffective disorder, unspecified: Secondary | ICD-10-CM | POA: Diagnosis present

## 2017-10-14 DIAGNOSIS — G9341 Metabolic encephalopathy: Secondary | ICD-10-CM | POA: Diagnosis not present

## 2017-10-14 DIAGNOSIS — F251 Schizoaffective disorder, depressive type: Secondary | ICD-10-CM | POA: Diagnosis not present

## 2017-10-14 LAB — COMPREHENSIVE METABOLIC PANEL
ALK PHOS: 48 U/L (ref 38–126)
ALT: 8 U/L (ref 0–44)
ANION GAP: 7 (ref 5–15)
AST: 15 U/L (ref 15–41)
Albumin: 3.8 g/dL (ref 3.5–5.0)
BILIRUBIN TOTAL: 0.6 mg/dL (ref 0.3–1.2)
BUN: 14 mg/dL (ref 6–20)
CALCIUM: 8.9 mg/dL (ref 8.9–10.3)
CO2: 26 mmol/L (ref 22–32)
Chloride: 109 mmol/L (ref 98–111)
Creatinine, Ser: 1.06 mg/dL (ref 0.61–1.24)
GFR calc non Af Amer: >60 mL/min (ref >60)
Glucose, Bld: 116 mg/dL — ABNORMAL HIGH (ref 70–99)
Potassium: 4.3 mmol/L (ref 3.5–5.1)
Sodium: 142 mmol/L (ref 135–145)
TOTAL PROTEIN: 6.7 g/dL (ref 6.5–8.1)

## 2017-10-14 LAB — ETHANOL

## 2017-10-14 LAB — CBC
HCT: 40.5 % (ref 40.0–52.0)
HEMATOCRIT: 40.7 % (ref 40.0–52.0)
HEMOGLOBIN: 14.3 g/dL (ref 13.0–18.0)
Hemoglobin: 14.1 g/dL (ref 13.0–18.0)
MCH: 32.6 pg (ref 26.0–34.0)
MCH: 32.3 pg (ref 26.0–34.0)
MCHC: 35.3 g/dL (ref 32.0–36.0)
MCHC: 34.8 g/dL (ref 32.0–36.0)
MCV: 92.4 fL (ref 80.0–100.0)
MCV: 92.9 fL (ref 80.0–100.0)
Platelets: 134 10*3/uL — ABNORMAL LOW (ref 150–440)
Platelets: 139 10*3/uL — ABNORMAL LOW (ref 150–440)
RBC: 4.38 MIL/uL — AB (ref 4.40–5.90)
RBC: 4.38 MIL/uL — ABNORMAL LOW (ref 4.40–5.90)
RDW: 12.3 % (ref 11.5–14.5)
RDW: 12.5 % (ref 11.5–14.5)
WBC: 8.2 10*3/uL (ref 3.8–10.6)
WBC: 8.2 10*3/uL (ref 3.8–10.6)

## 2017-10-14 LAB — DIFFERENTIAL
BASOS PCT: 0 %
Basophils Absolute: 0 10*3/uL (ref 0–0.1)
EOS ABS: 0.1 10*3/uL (ref 0–0.7)
EOS PCT: 2 %
Lymphocytes Relative: 16 %
Lymphs Abs: 1.3 10*3/uL (ref 1.0–3.6)
MONO ABS: 0.8 10*3/uL (ref 0.2–1.0)
Monocytes Relative: 9 %
Neutro Abs: 6 10*3/uL (ref 1.4–6.5)
Neutrophils Relative %: 73 %

## 2017-10-14 LAB — TROPONIN I

## 2017-10-14 LAB — ACETAMINOPHEN LEVEL

## 2017-10-14 LAB — URINALYSIS, ROUTINE W REFLEX MICROSCOPIC
Bilirubin Urine: NEGATIVE
GLUCOSE, UA: NEGATIVE mg/dL
HGB URINE DIPSTICK: NEGATIVE
KETONES UR: 5 mg/dL — AB
LEUKOCYTES UA: NEGATIVE
Nitrite: NEGATIVE
PROTEIN: NEGATIVE mg/dL
Specific Gravity, Urine: 1.011 (ref 1.005–1.030)
pH: 7 (ref 5.0–8.0)

## 2017-10-14 LAB — URINE DRUG SCREEN, QUALITATIVE (ARMC ONLY)
Amphetamines, Ur Screen: NOT DETECTED
BENZODIAZEPINE, UR SCRN: POSITIVE — AB
CANNABINOID 50 NG, UR ~~LOC~~: POSITIVE — AB
Cocaine Metabolite,Ur ~~LOC~~: NOT DETECTED
MDMA (ECSTASY) UR SCREEN: NOT DETECTED
Methadone Scn, Ur: NOT DETECTED
Opiate, Ur Screen: NOT DETECTED
PHENCYCLIDINE (PCP) UR S: NOT DETECTED
TRICYCLIC, UR SCREEN: NOT DETECTED

## 2017-10-14 LAB — SALICYLATE LEVEL: Salicylate Lvl: <7 mg/dL (ref 2.8–30.0)

## 2017-10-14 MED ORDER — ACETAMINOPHEN 325 MG PO TABS: 650.0000 mg | ORAL_TABLET | Freq: Four times a day (QID) | ORAL | Status: DC | PRN

## 2017-10-14 MED ORDER — GLYCOPYRROLATE 1 MG PO TABS
2.0000 mg | ORAL_TABLET | Freq: Every day | ORAL | Status: DC
  Administered 2017-10-14 – 2017-10-15 (×2): 2 mg via ORAL
  Filled 2017-10-14 (×3): qty 2

## 2017-10-14 MED ORDER — QUETIAPINE FUMARATE 200 MG PO TABS
400.0000 mg | ORAL_TABLET | Freq: Every day | ORAL | Status: DC
  Administered 2017-10-14 – 2017-10-15 (×2): 400 mg via ORAL
  Filled 2017-10-14 (×3): qty 2

## 2017-10-14 MED ORDER — PANTOPRAZOLE SODIUM 40 MG PO TBEC
40.0000 mg | DELAYED_RELEASE_TABLET | Freq: Every day | ORAL | Status: DC
  Administered 2017-10-15 – 2017-10-16 (×2): 40 mg via ORAL
  Filled 2017-10-14 (×2): qty 1

## 2017-10-14 MED ORDER — SODIUM CHLORIDE 0.9 % IV BOLUS
1000.0000 mL | Freq: Once | INTRAVENOUS | Status: AC
  Administered 2017-10-14: 1000 mL via INTRAVENOUS

## 2017-10-14 MED ORDER — ENOXAPARIN SODIUM 40 MG/0.4ML ~~LOC~~ SOLN
40.0000 mg | SUBCUTANEOUS | Status: DC
  Administered 2017-10-14 – 2017-10-15 (×2): 40 mg via SUBCUTANEOUS
  Filled 2017-10-14 (×2): qty 0.4

## 2017-10-14 MED ORDER — POLYETHYLENE GLYCOL 3350 17 G PO PACK: 17.0000 g | PACK | Freq: Every day | ORAL | Status: DC

## 2017-10-14 MED ORDER — LORAZEPAM 2 MG/ML IJ SOLN
1.0000 mg | Freq: Once | INTRAMUSCULAR | Status: AC
  Administered 2017-10-14: 1 mg via INTRAVENOUS
  Filled 2017-10-14: qty 1

## 2017-10-14 MED ORDER — DOCUSATE SODIUM 100 MG PO CAPS
100.0000 mg | ORAL_CAPSULE | Freq: Every day | ORAL | Status: DC
  Administered 2017-10-14: 100 mg via ORAL
  Filled 2017-10-14 (×2): qty 1

## 2017-10-14 MED ORDER — TEMAZEPAM 15 MG PO CAPS
15.0000 mg | ORAL_CAPSULE | Freq: Every day | ORAL | Status: DC
  Administered 2017-10-14: 15 mg via ORAL
  Filled 2017-10-14: qty 1

## 2017-10-14 MED ORDER — DIVALPROEX SODIUM ER 500 MG PO TB24
1000.0000 mg | ORAL_TABLET | Freq: Every day | ORAL | Status: DC
  Administered 2017-10-14: 1000 mg via ORAL
  Filled 2017-10-14 (×2): qty 2

## 2017-10-14 MED ORDER — NICOTINE 21 MG/24HR TD PT24
21.0000 mg | MEDICATED_PATCH | Freq: Every day | TRANSDERMAL | Status: DC
  Administered 2017-10-14 – 2017-10-16 (×3): 21 mg via TRANSDERMAL
  Filled 2017-10-14 (×3): qty 1

## 2017-10-14 MED ORDER — ONDANSETRON HCL 4 MG/2ML IJ SOLN: 4.0000 mg | Freq: Four times a day (QID) | INTRAMUSCULAR | Status: DC | PRN

## 2017-10-14 MED ORDER — ONDANSETRON HCL 4 MG PO TABS: 4.0000 mg | ORAL_TABLET | Freq: Four times a day (QID) | ORAL | Status: DC | PRN

## 2017-10-14 MED ORDER — SODIUM CHLORIDE 0.9 % IV SOLN
INTRAVENOUS | Status: DC
  Administered 2017-10-14 – 2017-10-16 (×2): via INTRAVENOUS

## 2017-10-14 MED ORDER — ACETAMINOPHEN 650 MG RE SUPP: 650.0000 mg | Freq: Four times a day (QID) | RECTAL | Status: DC | PRN

## 2017-10-14 MED ORDER — NALOXONE HCL 2 MG/2ML IJ SOSY
0.5000 mg | PREFILLED_SYRINGE | Freq: Once | INTRAMUSCULAR | Status: AC
  Administered 2017-10-14: 0.5 mg via INTRAVENOUS
  Filled 2017-10-14: qty 2

## 2017-10-14 MED ORDER — PROPRANOLOL HCL 20 MG PO TABS
20.0000 mg | ORAL_TABLET | Freq: Three times a day (TID) | ORAL | Status: DC
  Administered 2017-10-14 – 2017-10-16 (×5): 20 mg via ORAL
  Filled 2017-10-14 (×8): qty 1

## 2017-10-14 MED ORDER — MIRTAZAPINE 15 MG PO TABS
7.5000 mg | ORAL_TABLET | Freq: Every day | ORAL | Status: DC
  Administered 2017-10-14 – 2017-10-15 (×2): 7.5 mg via ORAL
  Filled 2017-10-14 (×2): qty 1

## 2017-10-14 MED ORDER — CLOZAPINE 100 MG PO TABS
200.0000 mg | ORAL_TABLET | Freq: Every day | ORAL | Status: DC
  Administered 2017-10-14 – 2017-10-15 (×2): 200 mg via ORAL
  Filled 2017-10-14 (×3): qty 2

## 2017-10-14 NOTE — ED Provider Notes (Addendum)
[facility] Emergency Department Provider Note  Time seen: 11:15 AM  I have reviewed the triage vital signs and the nursing notes.   HISTORY  Chief Complaint Altered Mental Status and Cough    HPI Isaiah Peterson. is a 42 y.o. male with a past medical history of bipolar, schizophrenia, presents to the emergency department for unresponsiveness.  Per EMS the patient family called for unresponsiveness.  Upon their arrival EMS states the patient was awake but with very mumbled slurred speech.  Upon arrival to the emergency department the patient is awake alert oriented to person and place but not time.  Patient continues to have very slurred/mumbled speech.  Appears to be moving all extremities.  Has a wet sounding cough in the emergency department but is not able to contribute much to his own history or review of systems secondary to acute confusion.   Past Medical History:  Diagnosis Date  . Bipolar 1 disorder ([place])   . Gallstones   . Pancreatitis    questionable mild biliary pancreatitis  . Schizophrenia, schizo-affective [facility])     Patient Active Problem List   Diagnosis Date Noted  . Tobacco use disorder 06/08/2017  . GERD (gastroesophageal reflux disease) 06/08/2017  . Cannabis abuse   . Schizoaffective disorder, bipolar type without good prognostic features ([place]) 06/07/2017  . Dysphagia 05/28/2012  . Abdominal pain, chronic, epigastric 05/28/2012  . Gallstones 08/06/2011  . Pancreatitis 08/06/2011  . Cannabis use disorder, moderate, dependence ([place]) 06/10/2011  . Schizophrenia, paranoid, chronic ([place]) 06/08/2011    Class: Acute    Past Surgical History:  Procedure Laterality Date  . APPENDECTOMY    . CHOLECYSTECTOMY  02/28/2012   Procedure: LAPAROSCOPIC CHOLECYSTECTOMY;  Surgeon: Jamesetta So, MD;  Location: [facility];  Service: General;  Laterality: N/A;  . ESOPHAGOGASTRODUODENOSCOPY  07/11/2006   RMR: Esophageal foreign body (chicken  bone), status post removal, as described above.  Complete esophagogastroduodenoscopy not carried out  . MULTIPLE TOOTH EXTRACTIONS     due to decay, nerve damage, edentulous    Prior to Admission medications   Medication Sig Start Date End Date Taking? Authorizing Provider  Alum & Mag Hydroxide-Simeth (MAGIC MOUTHWASH) SOLN Take 5 mLs by mouth 3 (three) times daily. Patient not taking: Reported on 06/06/2017 01/05/13   Junius Creamer, NP  cloZAPine (CLOZARIL) 200 MG tablet Take 1 tablet (200 mg total) by mouth at bedtime. 06/16/17   Pucilowska, Herma Ard B, MD  divalproex (DEPAKOTE ER) 500 MG 24 hr tablet Take 2 tablets (1,000 mg total) by mouth at bedtime. 06/16/17   Pucilowska, Herma Ard B, MD  docusate sodium (COLACE) 100 MG capsule Take 1 capsule (100 mg total) by mouth daily. 06/17/17   Pucilowska, Herma Ard B, MD  omeprazole (PRILOSEC) 20 MG capsule Take 1 capsule (20 mg total) by mouth daily. 06/16/17   Pucilowska, Jolanta B, MD  paliperidone (INVEGA SUSTENNA) 156 MG/ML SUSP injection Inject 1 mL (156 mg total) into the muscle every 30 (thirty) days. 06/16/17   Pucilowska, Jolanta B, MD  polyethylene glycol (MIRALAX / GLYCOLAX) packet Take 17 g by mouth daily. 06/17/17   Pucilowska, Herma Ard B, MD  propranolol (INDERAL) 20 MG tablet Take 1 tablet (20 mg total) by mouth 3 (three) times daily. 06/16/17   Pucilowska, Jolanta B, MD  temazepam (RESTORIL) 15 MG capsule Take 1 capsule (15 mg total) by mouth at bedtime. 06/16/17   Pucilowska, Wardell Honour, MD    Allergies  Allergen Reactions  . Haldol [Haloperidol  Lactate] Other (See Comments)    Tardive dyskinesia.    Family History  Problem Relation Age of Onset  . Aneurysm Mother        brain  . Liver disease Mother   . Colon cancer Maternal Uncle        great uncle  . Pancreatitis Maternal Uncle        died age 56  . Stomach cancer Maternal Aunt        great aunt    Social History Social History   Tobacco Use  . Smoking status: Current Every  Day Smoker    Packs/day: 1.50    Years: 20.00    Pack years: 30.00    Types: Cigarettes  . Smokeless tobacco: Never Used  Substance Use Topics  . Alcohol use: No    Comment: former quit about 10 years ago  . Drug use: Yes    Types: Marijuana    Comment: marijuana end of Jan 2014     Review of Systems Unable to obtain an adequate/accurate review of systems secondary to altered mental status/confusion  ____________________________________________   PHYSICAL EXAM:  Constitutional: Alert, oriented to person and place but not time.  Has very slurred/mumbled speech.  Will follow most commands. Eyes: Normal exam, 2 mm bilaterally, reactive. ENT   Head: Normocephalic and atraumatic.   Mouth/Throat: Mucous membranes are moist. Cardiovascular: Normal rate, regular rhythm. No murmur Respiratory: Normal respiratory effort without tachypnea nor retractions. Breath sounds are clear.  Occasional wet sounding cough during examination. Gastrointestinal: Soft and nontender. No distention. Musculoskeletal: Nontender with normal range of motion in all extremities.  Neurologic: Slurred/mumbled speech.  Confused.  Is able to follow simple commands such as open his mouth and squeeze my hands.  Appears to have equal grip strength bilaterally appears to be able to move all extremities equally. Skin:  Skin is warm, dry and intact.  Psychiatric: Appears to be confused.  ____________________________________________   RADIOLOGY  CT had negative Chest x-ray negative  EKG reviewed and interpreted by myself shows normal sinus rhythm at 67 bpm with a narrow QRS, normal axis, normal intervals, nonspecific but no concerning ST changes. ____________________________________________   INITIAL IMPRESSION / ASSESSMENT AND PLAN / ED COURSE  Pertinent labs & imaging results that were available during my care of the patient were reviewed by me and considered in my medical decision making (see chart for  details).  Patient presents to the emergency department from home after EMS was called out for unresponsiveness.  Patient is disoriented to time, confused unable to provide an adequate history.  Awaiting family arrival for further details.  Given the likelihood of acute confusion we will check labs, CT scan of the head, chest x-ray.  We will also dose IV fluids and a small dose of Narcan.  No reaction to Narcan.  Imaging is resulted negative.  Patient's labs are largely within normal limits besides a cannabinoid and benzodiazepine positive urinalysis.  I reviewed the patient's medications he is prescribed because of pain.  His mother is now here with the patient, she states this is an acute change this morning compared to baseline.  States normally he is able to communicate normally and is not confused.  He states he does not recall even coming to the hospital today, still is not able to tell me the correct year.  Patient's urinalysis being positive for benzodiazepines could explain the patient's symptoms today.  He is also prescribed clozapine as well as Depakote which could  also contribute to his symptoms.  However the patient remains acutely confused we will admit to the hospitalist service for continued work-up.  Patient and mother agreeable to plan of care.  Patient has become somewhat tremulous, possible withdrawal situation alcohol versus benzodiazepine.  We will dose 1 mg of IV Ativan and continue to closely monitor while awaiting admission to the hospital. ____________________________________________   FINAL CLINICAL IMPRESSION(S) / ED DIAGNOSES  Altered mental status    Harvest Dark, MD 10/14/17 1438    Harvest Dark, MD 10/14/17 1521

## 2017-10-14 NOTE — ED Notes (Signed)
Patient continuing to attempt to get out of bed and pulling at leads. This RN able to redirect patient back to bed. Water given. Mother at bedside with patient.

## 2017-10-14 NOTE — ED Triage Notes (Addendum)
Patient from home via [facility]. Per EMS patient was found unresponsive by family. EMS states that upon their arrival, patient was responding to voice, with mumbled speech. Upon arrival to ED, patient is alert and oriented to self and place, disoriented to time, however speech is mumbled. Attempting to follow commands. No complaints of pain at this time. Congested cough noted which patient states he as had since Friday.

## 2017-10-14 NOTE — ED Notes (Signed)
Ann, Hydrographic surveyor involved in placing patient on floor at this time. Rover from ED will go with patient to floor as 1:1 sitter. Charge nurse aware of delay.

## 2017-10-14 NOTE — Consult Note (Signed)
MEDICATION RELATED CONSULT NOTE - INITIAL   Pharmacy Consult for clozapine Indication: lab monitoring and REMs program reporting  Allergies  Allergen Reactions  . Haldol [Haloperidol Lactate] Other (See Comments)    Tardive dyskinesia.    Patient Measurements: Height: 5\' 10"  (177.8 cm) Weight: 149 lb (67.6 kg) IBW/kg (Calculated) : 73   Vital Signs: Temp: 101.6 F (38.7 C) (07/09 1754) Temp Source: Oral (07/09 1754) BP: 123/71 (07/09 1754) Pulse Rate: 91 (07/09 1754) Intake/Output from previous day: No intake/output data recorded. Intake/Output from this shift: Total I/O In: 240 [P.O.:240] Out: -   Labs: Recent Labs    10/14/17 1119  WBC 8.2  8.2  HGB 14.1  14.3  HCT 40.7  40.5  PLT 139*  134*  CREATININE 1.06  ALBUMIN 3.8  PROT 6.7  AST 15  ALT 8  ALKPHOS 48  BILITOT 0.6   Estimated Creatinine Clearance: 88.6 mL/min (by C-G formula based on SCr of 1.06 mg/dL).   Microbiology: No results found for this or any previous visit (from the past 720 hour(s)).  Medical History: Past Medical History:  Diagnosis Date  . Bipolar 1 disorder ([place])   . Gallstones   . Pancreatitis    questionable mild biliary pancreatitis  . Schizophrenia, schizo-affective [facility])     Assessment: Pharmacy consulted for lab monitoring and clozapine REMs program reporting for 42 year old male with PMH of schizophrenia. Patient is taking clozapine 200 mg at bedtime PTA.   Plan:  7/9 [place] 6000. Level has been submitted online to clozapine REMs program. Patient is eligible to receive clozapine with weekly lab monitoring and reporting. Next CBC/diff 7/16.  Tawnya Crook, PharmD Pharmacy Resident  10/14/2017 7:22 PM

## 2017-10-14 NOTE — Progress Notes (Signed)
Patient admitted to unit febrile. Notified MD and also requested CIWA protocol.  Primary nurse notified for need of floor mat and of above information.

## 2017-10-14 NOTE — ED Notes (Signed)
Patient transported to floor by Wilfred Lacy, EDT and [place], NT who will stay as patient's 1:1

## 2017-10-14 NOTE — ED Notes (Signed)
This RN entered patient's room to find patient had unhooked himself from monitor and had unhooked IV fluids for IV. Patient standing at end up bed. Patient appears unsteady on feet. This RN able to assist patient back to bed and reattach him to monitor. MD aware. Verbal order for safety sitter given. Charge nurse made aware.

## 2017-10-14 NOTE — ED Notes (Signed)
Helped patient to use the urinal .

## 2017-10-14 NOTE — Progress Notes (Signed)
Advanced care plan. Purpose of the Encounter: CODE STATUS Parties in Attendance: Patient and family  Patient's Decision Capacity: Not good Subjective/Patient's story: Presented to emergency room for confusion and lethargy Objective/Medical story Has schizophrenia and not taking meds Goals of care determination:  Advance care directives, goals of care discussed Patients family wants everything done which includes cpr, intubation, ventilator if need arises. CODE STATUS: Full code Time spent discussing advanced care planning: 16 minutes

## 2017-10-14 NOTE — H&P (Signed)
[place] [facility] [place] NAME: Isaiah Peterson    MR#:  [id number]  DATE OF BIRTH:  15-Mar-1976  DATE OF ADMISSION:  10/14/2017  PRIMARY CARE PHYSICIAN: Ellamae Sia, MD   REQUESTING/REFERRING PHYSICIAN:   CHIEF COMPLAINT:   Chief Complaint  Patient presents with  . Altered Mental Status  . Cough    HISTORY OF PRESENT ILLNESS: Isaiah Peterson  is a 42 y.o. male with a known history of bipolar disorder, schizophrenia, cholelithiasis presented to the emergency room for confusion.  Patient was found on the floor and his roommate called EMS and patient was brought to the ER.  According to patient's mother who was in the ER he has not been taking his medications regularly for the last couple of weeks patient is awake but confused not completely oriented he is on one-on-one observation for safety.  Patient is an active smoker.  S service was consulted for further care.  Patient was worked up with CT head and chest x-ray which were normal studies.  PAST MEDICAL HISTORY:   Past Medical History:  Diagnosis Date  . Bipolar 1 disorder ([place])   . Gallstones   . Pancreatitis    questionable mild biliary pancreatitis  . Schizophrenia, schizo-affective ([place])     PAST SURGICAL HISTORY:  Past Surgical History:  Procedure Laterality Date  . APPENDECTOMY    . CHOLECYSTECTOMY  02/28/2012   Procedure: LAPAROSCOPIC CHOLECYSTECTOMY;  Surgeon: Jamesetta So, MD;  Location: [facility];  Service: General;  Laterality: N/A;  . ESOPHAGOGASTRODUODENOSCOPY  07/11/2006   RMR: Esophageal foreign body (chicken bone), status post removal, as described above.  Complete esophagogastroduodenoscopy not carried out  . MULTIPLE TOOTH EXTRACTIONS     due to decay, nerve damage, edentulous    SOCIAL HISTORY:  Social History   Tobacco Use  . Smoking status: Current Every Day Smoker    Packs/day: 1.50    Years: 20.00    Pack years: 30.00    Types: Cigarettes  .  Smokeless tobacco: Never Used  Substance Use Topics  . Alcohol use: No    Comment: former quit about 10 years ago    FAMILY HISTORY:  Family History  Problem Relation Age of Onset  . Aneurysm Mother        brain  . Liver disease Mother   . Colon cancer Maternal Uncle        great uncle  . Pancreatitis Maternal Uncle        died age 67  . Stomach cancer Maternal Aunt        great aunt    DRUG ALLERGIES:  Allergies  Allergen Reactions  . Haldol [Haloperidol Lactate] Other (See Comments)    Tardive dyskinesia.    REVIEW OF SYSTEMS:   Could not be obtained completely secondary to confusion  MEDICATIONS AT HOME:  Prior to Admission medications   Medication Sig Start Date End Date Taking? Authorizing Provider  cloZAPine (CLOZARIL) 200 MG tablet Take 1 tablet (200 mg total) by mouth at bedtime. 06/16/17  Yes Pucilowska, Jolanta B, MD  divalproex (DEPAKOTE ER) 500 MG 24 hr tablet Take 2 tablets (1,000 mg total) by mouth at bedtime. 06/16/17  Yes Pucilowska, Jolanta B, MD  docusate sodium (COLACE) 100 MG capsule Take 1 capsule (100 mg total) by mouth daily. 06/17/17  Yes Pucilowska, Jolanta B, MD  glycopyrrolate (ROBINUL) 2 MG tablet Take 2 mg by mouth at bedtime. 09/23/17  Yes [provider]  hydrochlorothiazide (MICROZIDE) 12.5 MG capsule Take 1 capsule by mouth daily. 10/11/17  Yes [provider]  mirtazapine (REMERON) 7.5 MG tablet Take 7.5 mg by mouth at bedtime. 08/13/17  Yes [provider]  omeprazole (PRILOSEC) 20 MG capsule Take 1 capsule (20 mg total) by mouth daily. 06/16/17  Yes Pucilowska, Jolanta B, MD  paliperidone (INVEGA SUSTENNA) 156 MG/ML SUSP injection Inject 1 mL (156 mg total) into the muscle every 30 (thirty) days. 06/16/17  Yes Pucilowska, Jolanta B, MD  polyethylene glycol (MIRALAX / GLYCOLAX) packet Take 17 g by mouth daily. 06/17/17  Yes Pucilowska, Jolanta B, MD  propranolol (INDERAL) 20 MG tablet Take 1 tablet (20 mg total) by mouth 3  (three) times daily. 06/16/17  Yes Pucilowska, Jolanta B, MD  QUEtiapine (SEROQUEL) 400 MG tablet Take 400 mg by mouth at bedtime. 07/16/17  Yes [provider]  temazepam (RESTORIL) 15 MG capsule Take 1 capsule (15 mg total) by mouth at bedtime. 06/16/17  Yes Pucilowska, Jolanta B, MD  Alum & Mag Hydroxide-Simeth (MAGIC MOUTHWASH) SOLN Take 5 mLs by mouth 3 (three) times daily. Patient not taking: Reported on 06/06/2017 01/05/13   Junius Creamer, NP      PHYSICAL EXAMINATION:   VITAL SIGNS: Blood pressure 140/83, pulse 75, temperature (!) 97.4 F (36.3 C), temperature source Oral, resp. rate 18, height 5\' 10"  (1.778 m), weight 67.6 kg (149 lb), SpO2 99 %.  GENERAL:  42 y.o.-year-old patient lying in the bed  EYES: Pupils equal, round, reactive to light and accommodation. No scleral icterus. Extraocular muscles intact.  HEENT: Head atraumatic, normocephalic. Oropharynx and nasopharynx clear.  NECK:  Supple, no jugular venous distention. No thyroid enlargement, no tenderness.  LUNGS: Normal breath sounds bilaterally, no wheezing, rales,rhonchi or crepitation. No use of accessory muscles of respiration.  CARDIOVASCULAR: S1, S2 normal. No murmurs, rubs, or gallops.  ABDOMEN: Soft, nontender, nondistended. Bowel sounds present. No organomegaly or mass.  EXTREMITIES: No pedal edema, cyanosis, or clubbing.  NEUROLOGIC: Awake but not completely oriented to time place and person.  Moves all extremities.  gait not checked.  PSYCHIATRIC: Could not be assessed completely SKIN: No obvious rash, lesion, or ulcer.   LABORATORY PANEL:   CBC Recent Labs  Lab 10/14/17 1119  WBC 8.2  HGB 14.3  HCT 40.5  PLT 134*  MCV 92.4  MCH 32.6  MCHC 35.3  RDW 12.3   ------------------------------------------------------------------------------------------------------------------  Chemistries  Recent Labs  Lab 10/14/17 1119  NA 142  K 4.3  CL 109  CO2 26  GLUCOSE 116*  BUN 14  CREATININE 1.06   CALCIUM 8.9  AST 15  ALT 8  ALKPHOS 48  BILITOT 0.6   ------------------------------------------------------------------------------------------------------------------ estimated creatinine clearance is 88.6 mL/min (by C-G formula based on SCr of 1.06 mg/dL). ------------------------------------------------------------------------------------------------------------------ No results for input(s): TSH, T4TOTAL, T3FREE, THYROIDAB in the last 72 hours.  Invalid input(s): FREET3   Coagulation profile No results for input(s): INR, PROTIME in the last 168 hours. ------------------------------------------------------------------------------------------------------------------- No results for input(s): DDIMER in the last 72 hours. -------------------------------------------------------------------------------------------------------------------  Cardiac Enzymes Recent Labs  Lab 10/14/17 1119  TROPONINI <0.03   ------------------------------------------------------------------------------------------------------------------ Invalid input(s): POCBNP  ---------------------------------------------------------------------------------------------------------------  Urinalysis    Component Value Date/Time   COLORURINE YELLOW (A) 10/14/2017 1126   APPEARANCEUR CLEAR (A) 10/14/2017 1126   LABSPEC 1.011 10/14/2017 [place] 7.0 10/14/2017 1126   [place] 10/14/2017 1126   HGBUR NEGATIVE 10/14/2017 1126   [place] 10/14/2017 1126   KETONESUR  5 (A) 10/14/2017 1126   PROTEINUR NEGATIVE 10/14/2017 1126   UROBILINOGEN 1.0 12/17/2012 1917   NITRITE NEGATIVE 10/14/2017 1126   LEUKOCYTESUR NEGATIVE 10/14/2017 1126     RADIOLOGY: Dg Chest 2 View  Result Date: 10/14/2017 CLINICAL DATA:  Cough with altered mental status, smoker EXAM: CHEST - 2 VIEW COMPARISON:  06/15/2017 FINDINGS: Normal heart size, mediastinal contours, and pulmonary vascularity. Lungs mildly  hyperinflated but clear. No infiltrate, pleural effusion or pneumothorax. Bones unremarkable. IMPRESSION: Mildly hyperinflated lungs without acute abnormalities. Electronically Signed   By: Lavonia Dana M.D.   On: 10/14/2017 11:52   Ct Head Wo Contrast  Result Date: 10/14/2017 CLINICAL DATA:  Pt unable to follow instructions or hold position for CT scan despite use of immobilization devices. Best images obtained. Triage note: Patient from home via [facility]. Per EMS patient was found unresponsive by family EXAM: CT HEAD WITHOUT CONTRAST TECHNIQUE: Contiguous axial images were obtained from the base of the skull through the vertex without intravenous contrast. COMPARISON:  None. FINDINGS: Brain: No acute intracranial hemorrhage. No focal mass lesion. No CT evidence of acute infarction. No midline shift or mass effect. No hydrocephalus. Basilar cisterns are patent. Vascular: No hyperdense vessel or unexpected calcification. Skull: Normal. Negative for fracture or focal lesion. Sinuses/Orbits: Paranasal sinuses and mastoid air cells are clear. Orbits are clear. Other: Cerumen impaction in the LEFT external auditory canal IMPRESSION: No acute intracranial findings. Electronically Signed   By: Suzy Bouchard M.D.   On: 10/14/2017 12:13    EKG: Orders placed or performed during the hospital encounter of 08/01/11  . ED EKG  . ED EKG  . EKG 12-Lead  . EKG 12-Lead  . EKG 12-Lead  . EKG 12-Lead  . EKG    IMPRESSION AND PLAN:  42 year old male patient with history of schizophrenia, bipolar disorder, cholelithiasis presented to the emergency room for confusion  - Altered mental status most probably secondary to benzodiazepine withdrawal From home medications One-on-one observation for patient safety  -Schizophrenia Psychiatric consultation to tailor medications  -Bipolar disorder Resume psych medications  -Tobacco cessation counseled to the patient for 6 minutes Nicotine patch offered  -DVT  prophylaxis subcu Lovenox daily  All the records are reviewed and case discussed with ED provider. Management plans discussed with the patient, family and they are in agreement.  CODE STATUS: Full code Code Status History    Date Active Date Inactive Code Status Order ID Comments User Context   06/07/2017 1601 06/17/2017 1743 Full Code [id number]  Chauncey Mann, MD Inpatient   06/07/2011 1057 06/08/2011 0535 Full Code [id number]  Kathalene Frames, MD ED       TOTAL TIME TAKING CARE OF THIS PATIENT: 53 minutes.    Saundra Shelling M.D on 10/14/2017 at 3:59 PM  Between 7am to 6pm - Pager - [phone]  After 6pm go to [email or web address] - password [email or web address] [facility]  Office  [phone]  CC: Primary care physician; Ellamae Sia, MD

## 2017-10-14 NOTE — ED Notes (Signed)
Safety sitter at bedside with patient. Patient restless and pulling at wires and sheets. Able to be redirected back to bed.

## 2017-10-15 DIAGNOSIS — F251 Schizoaffective disorder, depressive type: Secondary | ICD-10-CM

## 2017-10-15 LAB — BASIC METABOLIC PANEL
Anion gap: 7 (ref 5–15)
BUN: 9 mg/dL (ref 6–20)
CHLORIDE: 111 mmol/L (ref 98–111)
CO2: 24 mmol/L (ref 22–32)
Calcium: 8.3 mg/dL — ABNORMAL LOW (ref 8.9–10.3)
Creatinine, Ser: 1.21 mg/dL (ref 0.61–1.24)
Glucose, Bld: 128 mg/dL — ABNORMAL HIGH (ref 70–99)
POTASSIUM: 3.5 mmol/L (ref 3.5–5.1)
SODIUM: 142 mmol/L (ref 135–145)

## 2017-10-15 LAB — VALPROIC ACID LEVEL: VALPROIC ACID LVL: 87 ug/mL (ref 50.0–100.0)

## 2017-10-15 LAB — CBC
HEMATOCRIT: 36.3 % — AB (ref 40.0–52.0)
Hemoglobin: 12.6 g/dL — ABNORMAL LOW (ref 13.0–18.0)
MCH: 31.8 pg (ref 26.0–34.0)
MCHC: 34.7 g/dL (ref 32.0–36.0)
MCV: 91.7 fL (ref 80.0–100.0)
PLATELETS: 139 10*3/uL — AB (ref 150–440)
RBC: 3.96 MIL/uL — AB (ref 4.40–5.90)
RDW: 12.5 % (ref 11.5–14.5)
WBC: 12.5 10*3/uL — AB (ref 3.8–10.6)

## 2017-10-15 LAB — AMMONIA: Ammonia: 54 umol/L — ABNORMAL HIGH (ref 9–35)

## 2017-10-15 MED ORDER — LACTULOSE 10 GM/15ML PO SOLN
30.0000 g | Freq: Two times a day (BID) | ORAL | Status: DC
  Administered 2017-10-15 – 2017-10-16 (×3): 30 g via ORAL
  Filled 2017-10-15 (×3): qty 60

## 2017-10-15 NOTE — Consult Note (Signed)
[facility]   Reason for Consult: Consult for 42 year old man with a history of severe chronic mental illness who was brought to the hospital after being found passed out at home Referring Physician: Bobbye Charleston Patient Identification: Isaiah Peterson. MRN:  [id number] Principal Diagnosis: <principal problem not specified> Diagnosis:   Patient Active Problem List   Diagnosis Date Noted  . Altered mental state [R41.82] 10/14/2017  . Tobacco use disorder [F17.200] 06/08/2017  . GERD (gastroesophageal reflux disease) [K21.9] 06/08/2017  . Cannabis abuse [F12.10]   . Schizoaffective disorder, bipolar type without good prognostic features ([place]) [F25.0] 06/07/2017  . Dysphagia [R13.10] 05/28/2012  . Abdominal pain, chronic, epigastric [R10.13, G89.29] 05/28/2012  . Gallstones [K80.20] 08/06/2011  . Pancreatitis [K85.90] 08/06/2011  . Cannabis use disorder, moderate, dependence ([place]) [F12.20] 06/10/2011  . Schizophrenia, paranoid, chronic ([place]) [F20.0] 06/08/2011    Class: Acute    Total Time spent with patient: 1 hour  Subjective:   Isaiah Daune Perch. is a 42 y.o. male patient admitted with "suicidal homicidal".  HPI: Patient seen chart reviewed.  Patient familiar from previous encounters.  42 year old man with a long-standing history of schizophrenia or schizoaffective disorder was found by his family passed out at home.  Brought into the emergency room with altered mental status.  Patient was able to wake up enough to have a little bit of conversation with me this afternoon.  He told me that he was "suicidal homicidal".  I clarified with him that he did not actually mean that he wanted to hurt anyone else but that he had taken an overdose out of suicidal intent.  Cannot give any lucid reason for why he would want to kill himself.  Will not answer questions about current hallucinations.  Claims vaguely that he has been compliant with his medication recently.   Denies drug abuse.  Patient cannot remember which of his medications he might of overdosed on but says it was probably some combination of what he normally takes.  Medical history: History of pancreatitis and gallstones and goal disease  Social history: Lives at home with family of origin.  Does not regularly work outside the home.  Substance abuse history: Intermittent abuse of marijuana.  Not clear how much she is been using recently.  Not drinking or using other drugs.  Past Psychiatric History: Patient has a history of psychosis and mood disorder.  Has had several prior hospitalizations.  He has at least 2 known prior serious suicide attempts by overdose.  Patient was last in the hospital here within the last year and was discharged on a combination of mood stabilizers antipsychotics and antianxiety medicines.  Not clear how closely he has been following up with outpatient treatment.  Risk to Self:   Risk to Others:   Prior Inpatient Therapy:   Prior Outpatient Therapy:    Past Medical History:  Past Medical History:  Diagnosis Date  . Bipolar 1 disorder ([place])   . Gallstones   . Pancreatitis    questionable mild biliary pancreatitis  . Schizophrenia, schizo-affective ([place])     Past Surgical History:  Procedure Laterality Date  . APPENDECTOMY    . CHOLECYSTECTOMY  02/28/2012   Procedure: LAPAROSCOPIC CHOLECYSTECTOMY;  Surgeon: Jamesetta So, MD;  Location: [facility];  Service: General;  Laterality: N/A;  . ESOPHAGOGASTRODUODENOSCOPY  07/11/2006   RMR: Esophageal foreign body (chicken bone), status post removal, as described above.  Complete esophagogastroduodenoscopy not carried out  . MULTIPLE TOOTH EXTRACTIONS  due to decay, nerve damage, edentulous   Family History:  Family History  Problem Relation Age of Onset  . Aneurysm Mother        brain  . Liver disease Mother   . Colon cancer Maternal Uncle        great uncle  . Pancreatitis Maternal Uncle        died age 71   . Stomach cancer Maternal Aunt        great aunt   Family Psychiatric  History: Denies knowing of any Social History:  Social History   Substance and Sexual Activity  Alcohol Use No   Comment: former quit about 10 years ago     Social History   Substance and Sexual Activity  Drug Use Yes  . Types: Marijuana   Comment: marijuana end of Jan 2014     Social History   Socioeconomic History  . Marital status: Single    Spouse name: Not on file  . Number of children: Not on file  . Years of education: Not on file  . Highest education level: Not on file  Occupational History  . Not on file  Social Needs  . Financial resource strain: Not on file  . Food insecurity:    Worry: Not on file    Inability: Not on file  . Transportation needs:    Medical: Not on file    Non-medical: Not on file  Tobacco Use  . Smoking status: Current Every Day Smoker    Packs/day: 1.50    Years: 20.00    Pack years: 30.00    Types: Cigarettes  . Smokeless tobacco: Never Used  Substance and Sexual Activity  . Alcohol use: No    Comment: former quit about 10 years ago  . Drug use: Yes    Types: Marijuana    Comment: marijuana end of Jan 2014   . Sexual activity: Yes  Lifestyle  . Physical activity:    Days per week: Not on file    Minutes per session: Not on file  . Stress: Not on file  Relationships  . Social connections:    Talks on phone: Not on file    Gets together: Not on file    Attends religious service: Not on file    Active member of club or organization: Not on file    Attends meetings of clubs or organizations: Not on file    Relationship status: Not on file  Other Topics Concern  . Not on file  Social History Narrative  . Not on file   Additional Social History:    Allergies:   Allergies  Allergen Reactions  . Haldol [Haloperidol Lactate] Other (See Comments)    Tardive dyskinesia.    Labs:  Results for orders placed or performed during the hospital  encounter of 10/14/17 (from the past 48 hour(s))  Comprehensive metabolic panel     Status: Abnormal   Collection Time: 10/14/17 11:19 AM  Result Value Ref Range   Sodium 142 135 - 145 mmol/L   Potassium 4.3 3.5 - 5.1 mmol/L   Chloride 109 98 - 111 mmol/L    Comment: Please note change in reference range.   CO2 26 22 - 32 mmol/L   Glucose, Bld 116 (H) 70 - 99 mg/dL    Comment: Please note change in reference range.   BUN 14 6 - 20 mg/dL    Comment: Please note change in reference range.   Creatinine, Ser 1.06  0.61 - 1.24 mg/dL   Calcium 8.9 8.9 - 10.3 mg/dL   Total Protein 6.7 6.5 - 8.1 g/dL   Albumin 3.8 3.5 - 5.0 g/dL   AST 15 15 - 41 U/L   ALT 8 0 - 44 U/L    Comment: Please note change in reference range.   Alkaline Phosphatase 48 38 - 126 U/L   Total Bilirubin 0.6 0.3 - 1.2 mg/dL   GFR calc non Af Amer >60 >60 mL/min   GFR calc Af Amer >60 >60 mL/min    Comment: (NOTE) The eGFR has been calculated using the CKD EPI equation. This calculation has not been validated in all clinical situations. eGFR's persistently <60 mL/min signify possible Chronic Kidney Disease.    Anion gap 7 5 - 15    Comment: Performed at [facility], [address] [place], [place] [address]  CBC     Status: Abnormal   Collection Time: 10/14/17 11:19 AM  Result Value Ref Range   WBC 8.2 3.8 - 10.6 K/uL   RBC 4.38 (L) 4.40 - 5.90 MIL/uL   Hemoglobin 14.3 13.0 - 18.0 g/dL   HCT 40.5 40.0 - 52.0 %   MCV 92.4 80.0 - 100.0 fL   MCH 32.6 26.0 - 34.0 pg   MCHC 35.3 32.0 - 36.0 g/dL   RDW 12.3 11.5 - 14.5 %   Platelets 134 (L) 150 - 440 K/uL    Comment: Performed at [facility], [address] [place], [place] [address]  Troponin I     Status: None   Collection Time: 10/14/17 11:19 AM  Result Value Ref Range   Troponin I <0.03 <0.03 ng/mL    Comment: Performed at [facility], [address] [place], [place] [address]  Ethanol     Status: None   Collection Time:  10/14/17 11:19 AM  Result Value Ref Range   Alcohol, Ethyl (B) <10 <10 mg/dL    Comment: (NOTE) Lowest detectable limit for serum alcohol is 10 mg/dL. For medical purposes only. Performed at [facility], [address] [place], [place] [address]   Acetaminophen level     Status: Abnormal   Collection Time: 10/14/17 11:19 AM  Result Value Ref Range   Acetaminophen (Tylenol), Serum <10 (L) 10 - 30 ug/mL    Comment: (NOTE) Therapeutic concentrations vary significantly. A range of 10-30 ug/mL  may be an effective concentration for many patients. However, some  are best treated at concentrations outside of this range. Acetaminophen concentrations >150 ug/mL at 4 hours after ingestion  and >50 ug/mL at 12 hours after ingestion are often associated with  toxic reactions. Performed at [facility], [address] [place], [place] [address]   Salicylate level     Status: None   Collection Time: 10/14/17 11:19 AM  Result Value Ref Range   Salicylate Lvl <9.4 2.8 - 30.0 mg/dL    Comment: Performed at [facility], [address] [place], [place] [address]  CBC     Status: Abnormal   Collection Time: 10/14/17 11:19 AM  Result Value Ref Range   WBC 8.2 3.8 - 10.6 K/uL   RBC 4.38 (L) 4.40 - 5.90 MIL/uL   Hemoglobin 14.1 13.0 - 18.0 g/dL   HCT 40.7 40.0 - 52.0 %   MCV 92.9 80.0 - 100.0 fL   MCH 32.3 26.0 - 34.0 pg   MCHC 34.8 32.0 - 36.0 g/dL   RDW 12.5 11.5 - 14.5 %  Platelets 139 (L) 150 - 440 K/uL    Comment: Performed at [facility], [address] [place], [place] [address]  Differential     Status: None   Collection Time: 10/14/17 11:19 AM  Result Value Ref Range   Neutrophils Relative % 73 %   Neutro Abs 6.0 1.4 - 6.5 K/uL   Lymphocytes Relative 16 %   Lymphs Abs 1.3 1.0 - 3.6 K/uL   Monocytes Relative 9 %   Monocytes Absolute 0.8 0.2 - 1.0 K/uL   Eosinophils Relative 2 %   Eosinophils Absolute 0.1 0 - 0.7 K/uL   Basophils  Relative 0 %   Basophils Absolute 0.0 0 - 0.1 K/uL    Comment: Performed at [facility], [address] [place], [place] [address]  Urinalysis, Routine w reflex microscopic     Status: Abnormal   Collection Time: 10/14/17 11:26 AM  Result Value Ref Range   Color, Urine YELLOW (A) YELLOW   APPearance CLEAR (A) CLEAR   Specific Gravity, Urine 1.011 1.005 - 1.030   pH 7.0 5.0 - 8.0   Glucose, UA NEGATIVE NEGATIVE mg/dL   Hgb urine dipstick NEGATIVE NEGATIVE   Bilirubin Urine NEGATIVE NEGATIVE   Ketones, ur 5 (A) NEGATIVE mg/dL   Protein, ur NEGATIVE NEGATIVE mg/dL   Nitrite NEGATIVE NEGATIVE   Leukocytes, UA NEGATIVE NEGATIVE    Comment: Performed at [facility], [address] [place], [place] [address]  Urine Drug Screen, Qualitative ([facility] only)     Status: Abnormal   Collection Time: 10/14/17 11:27 AM  Result Value Ref Range   Tricyclic, Ur Screen NONE DETECTED NONE DETECTED   Amphetamines, Ur Screen NONE DETECTED NONE DETECTED   MDMA (Ecstasy)Ur Screen NONE DETECTED NONE DETECTED   Cocaine Metabolite,Ur [place] NONE DETECTED NONE DETECTED   Opiate, Ur Screen NONE DETECTED NONE DETECTED   Phencyclidine (PCP) Ur S NONE DETECTED NONE DETECTED   Cannabinoid 50 Ng, Ur [place] POSITIVE (A) NONE DETECTED   Barbiturates, Ur Screen (A) NONE DETECTED    Result not available. Reagent lot number recalled by manufacturer.   Benzodiazepine, Ur Scrn POSITIVE (A) NONE DETECTED   Methadone Scn, Ur NONE DETECTED NONE DETECTED    Comment: (NOTE) Tricyclics + metabolites, urine    Cutoff 1000 ng/mL Amphetamines + metabolites, urine  Cutoff 1000 ng/mL MDMA (Ecstasy), urine              Cutoff 500 ng/mL Cocaine Metabolite, urine          Cutoff 300 ng/mL Opiate + metabolites, urine        Cutoff 300 ng/mL Phencyclidine (PCP), urine         Cutoff 25 ng/mL Cannabinoid, urine                 Cutoff 50 ng/mL Barbiturates + metabolites, urine  Cutoff 200 ng/mL Benzodiazepine, urine               Cutoff 200 ng/mL Methadone, urine                   Cutoff 300 ng/mL The urine drug screen provides only a preliminary, unconfirmed analytical test result and should not be used for non-medical purposes. Clinical consideration and professional judgment should be applied to any positive drug screen result due to possible interfering substances. A more specific alternate chemical method must be used in order to obtain a confirmed analytical result. Gas chromatography / mass spectrometry (GC/MS) is the  preferred confirmat ory method. Performed at [facility], [address] [place], [place] [address]   Basic metabolic panel     Status: Abnormal   Collection Time: 10/15/17  5:25 AM  Result Value Ref Range   Sodium 142 135 - 145 mmol/L   Potassium 3.5 3.5 - 5.1 mmol/L   Chloride 111 98 - 111 mmol/L    Comment: Please note change in reference range.   CO2 24 22 - 32 mmol/L   Glucose, Bld 128 (H) 70 - 99 mg/dL    Comment: Please note change in reference range.   BUN 9 6 - 20 mg/dL    Comment: Please note change in reference range.   Creatinine, Ser 1.21 0.61 - 1.24 mg/dL   Calcium 8.3 (L) 8.9 - 10.3 mg/dL   GFR calc non Af Amer >60 >60 mL/min   GFR calc Af Amer >60 >60 mL/min    Comment: (NOTE) The eGFR has been calculated using the CKD EPI equation. This calculation has not been validated in all clinical situations. eGFR's persistently <60 mL/min signify possible Chronic Kidney Disease.    Anion gap 7 5 - 15    Comment: Performed at [facility], [address] [place], [place] [address]  CBC     Status: Abnormal   Collection Time: 10/15/17  5:25 AM  Result Value Ref Range   WBC 12.5 (H) 3.8 - 10.6 K/uL   RBC 3.96 (L) 4.40 - 5.90 MIL/uL   Hemoglobin 12.6 (L) 13.0 - 18.0 g/dL   HCT 36.3 (L) 40.0 - 52.0 %   MCV 91.7 80.0 - 100.0 fL   MCH 31.8 26.0 - 34.0 pg   MCHC 34.7 32.0 - 36.0 g/dL   RDW 12.5 11.5 - 14.5 %   Platelets 139 (L) 150 - 440 K/uL     Comment: Performed at [facility], [address] [place], [place] [address]  Valproic acid level     Status: None   Collection Time: 10/15/17  5:25 AM  Result Value Ref Range   Valproic Acid Lvl 87 50.0 - 100.0 ug/mL    Comment: Performed at [facility], [address] [place], [place] [address]  Ammonia     Status: Abnormal   Collection Time: 10/15/17 10:08 AM  Result Value Ref Range   Ammonia 54 (H) 9 - 35 umol/L    Comment: Performed at [facility], [address] [place], [place] [address]    Current Facility-Administered Medications  Medication Dose Route Frequency Provider Last Rate Last Dose  . 0.9 %  sodium chloride infusion   Intravenous Continuous Saundra Shelling, MD 75 mL/hr at 10/14/17 2053    . acetaminophen (TYLENOL) tablet 650 mg  650 mg Oral Q6H PRN Saundra Shelling, MD       Or  . acetaminophen (TYLENOL) suppository 650 mg  650 mg Rectal Q6H PRN Pyreddy, Reatha Harps, MD      . cloZAPine (CLOZARIL) tablet 200 mg  200 mg Oral QHS Pyreddy, Reatha Harps, MD   200 mg at 10/14/17 2050  . divalproex (DEPAKOTE ER) 24 hr tablet 1,000 mg  1,000 mg Oral QHS Pyreddy, Reatha Harps, MD   1,000 mg at 10/14/17 2049  . docusate sodium (COLACE) capsule 100 mg  100 mg Oral Daily Pyreddy, Reatha Harps, MD   100 mg at 10/14/17 2057  . enoxaparin (LOVENOX) injection 40 mg  40 mg Subcutaneous Q24H Saundra Shelling, MD   40 mg at 10/14/17 2048  .  glycopyrrolate (ROBINUL) tablet 2 mg  2 mg Oral QHS Pyreddy, Reatha Harps, MD   2 mg at 10/14/17 2050  . lactulose (CHRONULAC) 10 GM/15ML solution 30 g  30 g Oral BID Loletha Grayer, MD   30 g at 10/15/17 1115  . mirtazapine (REMERON) tablet 7.5 mg  7.5 mg Oral QHS Pyreddy, Reatha Harps, MD   7.5 mg at 10/14/17 2048  . nicotine (NICODERM CQ - dosed in mg/24 hours) patch 21 mg  21 mg Transdermal Daily Pyreddy, Reatha Harps, MD   21 mg at 10/15/17 1116  . ondansetron (ZOFRAN) tablet 4 mg  4 mg Oral Q6H PRN Pyreddy, Reatha Harps, MD       Or  . ondansetron (ZOFRAN)  injection 4 mg  4 mg Intravenous Q6H PRN Pyreddy, [place], MD      . pantoprazole (PROTONIX) EC tablet 40 mg  40 mg Oral Daily Pyreddy, Reatha Harps, MD   40 mg at 10/15/17 1116  . propranolol (INDERAL) tablet 20 mg  20 mg Oral TID Saundra Shelling, MD   20 mg at 10/15/17 1151  . QUEtiapine (SEROQUEL) tablet 400 mg  400 mg Oral QHS Saundra Shelling, MD   400 mg at 10/14/17 2051  . temazepam (RESTORIL) capsule 15 mg  15 mg Oral QHS Saundra Shelling, MD   15 mg at 10/14/17 2049    Musculoskeletal: Strength & Muscle Tone: decreased Gait & Station: unsteady Patient leans: N/A  Psychiatric Specialty Exam: Physical Exam  Nursing note and vitals reviewed. Constitutional: He appears well-developed and well-nourished.  HENT:  Head: Normocephalic and atraumatic.  Eyes: Pupils are equal, round, and reactive to light. Conjunctivae are normal.  Neck: Normal range of motion.  Cardiovascular: Regular rhythm and normal heart sounds.  Respiratory: Effort normal. No respiratory distress.  GI: Soft.  Musculoskeletal: Normal range of motion.  Neurological: He is alert.  Skin: Skin is warm and dry.  Psychiatric: His affect is blunt. His speech is delayed. He is not agitated and not aggressive. Cognition and memory are impaired. He expresses impulsivity. He expresses suicidal ideation. He is noncommunicative.    Review of Systems  Unable to perform ROS: Psychiatric disorder  Psychiatric/Behavioral: Positive for suicidal ideas.    Blood pressure 116/82, pulse 78, temperature 98.3 F (36.8 C), temperature source Oral, resp. rate 12, height 5' 10"  (1.778 m), weight 65.5 kg (144 lb 8 oz), SpO2 94 %.Body mass index is 20.73 kg/m.  General Appearance: Disheveled  Eye Contact:  Minimal  Speech:  Slow and Slurred  Volume:  Decreased  Mood:  Depressed  Affect:  Congruent  Thought Process:  Disorganized  Orientation:  Other:  Patient knows he is in the hospital.  He is a little vague about the date.  He remembers having  taken an overdose.  Thought Content:  Rumination  Suicidal Thoughts:  Yes.  with intent/plan  Homicidal Thoughts:  No  Memory:  Immediate;   Fair Recent;   Fair Remote;   Fair  Judgement:  Poor  Insight:  Shallow  Psychomotor Activity:  Decreased  Concentration:  Concentration: Fair  Recall:  [employer] of Knowledge:  Fair  Language:  Poor  Akathisia:  No  Handed:  Right  AIMS (if indicated):     Assets:  Financial Resources/Insurance Housing Social Support  ADL's:  Impaired  Cognition:  Impaired,  Mild  Sleep:        Treatment Plan Summary: Daily contact with patient to assess and evaluate symptoms and progress in treatment, Medication management and  Plan Patient came into the hospital being found unconscious.  Everything about this looks like he really did just take an overdose.  No other specific illness apparent on the workup.  I ordered a Depakote level and an ammonia level today to complete some of the workup.  His ammonia level was elevated above 50 and his Depakote level was still in the normal range which suggests to me that it was probably higher yesterday.  Patient is prescribed chronic benzodiazepines which probably were the major part of what is keeping him sedated now.  I agree with continuing the involuntary commitment paperwork.  Once he is medically stabilized I expect he will need to be transferred to psychiatry.  We can continue his basic antipsychotics for now although some levels are still pending.  I will hold off on the Depakote and the Restoril because of the oversedation  Disposition: Recommend psychiatric Inpatient admission when medically cleared. Supportive therapy provided about ongoing stressors.  Alethia Berthold, MD 10/15/2017 2:54 PM

## 2017-10-15 NOTE — Progress Notes (Signed)
Patient ID: Isaiah Peterson., male   DOB: Dec 17, 1975, 41 y.o.   MRN: [id number]  [facility] PROGRESS NOTE  Dyshon Marcello Moores [name]. ZLD:[id number] DOB: 04-24-1975 DOA: 10/14/2017 PCP: Ellamae Sia, MD  HPI/Subjective: Patient given a sternal rub in order to wake up.  Patient answered a few questions but fell back asleep.  Patient on quite a few psychiatric medications.  Objective: Vitals:   10/15/17 0511 10/15/17 1113  BP: 108/65 116/82  Pulse: 80 78  Resp: 20 12  Temp: 97.6 F (36.4 C) 98.3 F (36.8 C)  SpO2: 94% 94%    Intake/Output Summary (Last 24 hours) at 10/15/2017 1448 Last data filed at 10/15/2017 1422 Gross per 24 hour  Intake 968.75 ml  Output 575 ml  Net 393.75 ml   Filed Weights   10/14/17 1104 10/15/17 0500  Weight: 67.6 kg (149 lb) 65.5 kg (144 lb 8 oz)    ROS: Review of Systems  Unable to perform ROS: Acuity of condition  Respiratory: Negative for shortness of breath.   Cardiovascular: Negative for chest pain.  Gastrointestinal: Negative for abdominal pain.  Genitourinary: Negative for dysuria.   Exam: Physical Exam  Constitutional: He appears lethargic.  HENT:  Nose: Mucosal edema present.  Mouth/Throat: No oropharyngeal exudate.  Eyes: Pupils are equal, round, and reactive to light. Conjunctivae and lids are normal.  Cardiovascular: Regular rhythm, S1 normal, S2 normal and normal heart sounds.  Pulses:      Dorsalis pedis pulses are 2+ on the right side, and 2+ on the left side.  Respiratory: He has no decreased breath sounds. He has no wheezes. He has no rhonchi.  GI: Soft. Bowel sounds are normal. There is no tenderness.  Musculoskeletal:       Right ankle: He exhibits no swelling.       Left ankle: He exhibits no swelling.  Neurological: He appears lethargic.  Patient moves all extremities for me  Skin: Skin is warm. No rash noted. Nails show no clubbing.  Psychiatric:  Patient answered a few questions but fell back asleep.       Data Reviewed: Basic Metabolic Panel: Recent Labs  Lab 10/14/17 1119 10/15/17 0525  NA 142 142  K 4.3 3.5  CL 109 111  CO2 26 24  GLUCOSE 116* 128*  BUN 14 9  CREATININE 1.06 1.21  CALCIUM 8.9 8.3*   Liver Function Tests: Recent Labs  Lab 10/14/17 1119  AST 15  ALT 8  ALKPHOS 48  BILITOT 0.6  PROT 6.7  ALBUMIN 3.8    Recent Labs  Lab 10/15/17 1008  AMMONIA 54*   CBC: Recent Labs  Lab 10/14/17 1119 10/15/17 0525  WBC 8.2  8.2 12.5*  NEUTROABS 6.0  --   HGB 14.1  14.3 12.6*  HCT 40.7  40.5 36.3*  MCV 92.9  92.4 91.7  PLT 139*  134* 139*   Cardiac Enzymes: Recent Labs  Lab 10/14/17 1119  TROPONINI <0.03     Studies: Dg Chest 2 View  Result Date: 10/14/2017 CLINICAL DATA:  Cough with altered mental status, smoker EXAM: CHEST - 2 VIEW COMPARISON:  06/15/2017 FINDINGS: Normal heart size, mediastinal contours, and pulmonary vascularity. Lungs mildly hyperinflated but clear. No infiltrate, pleural effusion or pneumothorax. Bones unremarkable. IMPRESSION: Mildly hyperinflated lungs without acute abnormalities. Electronically Signed   By: Lavonia Dana M.D.   On: 10/14/2017 11:52   Ct Head Wo Contrast  Result Date: 10/14/2017 CLINICAL DATA:  Pt unable to follow instructions  or hold position for CT scan despite use of immobilization devices. Best images obtained. Triage note: Patient from home via [facility]. Per EMS patient was found unresponsive by family EXAM: CT HEAD WITHOUT CONTRAST TECHNIQUE: Contiguous axial images were obtained from the base of the skull through the vertex without intravenous contrast. COMPARISON:  None. FINDINGS: Brain: No acute intracranial hemorrhage. No focal mass lesion. No CT evidence of acute infarction. No midline shift or mass effect. No hydrocephalus. Basilar cisterns are patent. Vascular: No hyperdense vessel or unexpected calcification. Skull: Normal. Negative for fracture or focal lesion. Sinuses/Orbits: Paranasal sinuses and  mastoid air cells are clear. Orbits are clear. Other: Cerumen impaction in the LEFT external auditory canal IMPRESSION: No acute intracranial findings. Electronically Signed   By: Suzy Bouchard M.D.   On: 10/14/2017 12:13    Scheduled Meds: . clozapine  200 mg Oral QHS  . divalproex  1,000 mg Oral QHS  . docusate sodium  100 mg Oral Daily  . enoxaparin (LOVENOX) injection  40 mg Subcutaneous Q24H  . glycopyrrolate  2 mg Oral QHS  . lactulose  30 g Oral BID  . mirtazapine  7.5 mg Oral QHS  . nicotine  21 mg Transdermal Daily  . pantoprazole  40 mg Oral Daily  . propranolol  20 mg Oral TID  . QUEtiapine  400 mg Oral QHS  . temazepam  15 mg Oral QHS   Continuous Infusions: . sodium chloride 75 mL/hr at 10/14/17 2053    Assessment/Plan:  1. Acute encephalopathy.  Unclear etiology.  Ammonia level slightly elevated which can happen with Depakote.  We will give a couple doses of lactulose and see how he does.  Psychiatric consultation to adjust medications and evaluate. 2. History of schizophrenia and schizoaffective disorder bipolar 1 disorder.  Medications as per psychiatry 3. GERD on Protonix  Code Status:     Code Status Orders  (From admission, onward)        Start     Ordered   10/14/17 1800  Full code  Continuous     10/14/17 1759    Code Status History    Date Active Date Inactive Code Status Order ID Comments User Context   06/07/2017 1601 06/17/2017 1743 Full Code [id number]  Chauncey Mann, MD Inpatient   06/07/2011 1057 06/08/2011 0535 Full Code [id number]  Kathalene Frames, MD ED      Disposition Plan: Once mental status improves can assess him further.  Consultants:  Psychiatry  Time spent: 26 minutes  [place]

## 2017-10-15 NOTE — Progress Notes (Signed)
Spoke with Dr. Weber Cooks re: IVC paperwork, he will take care of this for pt.

## 2017-10-16 LAB — VALPROIC ACID LEVEL: Valproic Acid Lvl: 68 ug/mL (ref 50.0–100.0)

## 2017-10-16 LAB — AMMONIA: Ammonia: 35 umol/L (ref 9–35)

## 2017-10-16 MED ORDER — NICOTINE 21 MG/24HR TD PT24: 21.0000 mg | MEDICATED_PATCH | Freq: Every day | TRANSDERMAL | 0 refills | Status: DC

## 2017-10-16 NOTE — Progress Notes (Signed)
Patient has no acute event overnight, he has a 1:1 bedside safety sitter overnight. VSS, NSR , denied being in any discomfort.

## 2017-10-16 NOTE — Progress Notes (Signed)
PT Cancellation Note  Patient Details Name: Isaiah Peterson. MRN: [id number] DOB: 1976/02/10   Cancelled Treatment:    Reason Eval/Treat Not Completed: PT screened, no needs identified, will sign off.  Per RN the pt is independently ambulating in the room and hallway with no signs of instability or weakness.  No skilled PT needs identified, PT will sign off.   Collie Siad PT, DPT 10/16/2017, 3:35 PM

## 2017-10-16 NOTE — Discharge Summary (Signed)
[place] at [place] NAME: Isaiah Peterson    MR#:  [id number]  DATE OF BIRTH:  1976/01/10  DATE OF ADMISSION:  10/14/2017 ADMITTING PHYSICIAN: Saundra Shelling, MD  DATE OF DISCHARGE: 10/16/2017  PRIMARY CARE PHYSICIAN: Ellamae Sia, MD    ADMISSION DIAGNOSIS:  Confusion [R41.0] Altered mental status, unspecified altered mental status type [R41.82]  DISCHARGE DIAGNOSIS:  Active Problems:   Altered mental state   SECONDARY DIAGNOSIS:   Past Medical History:  Diagnosis Date  . Bipolar 1 disorder ([place])   . Gallstones   . Pancreatitis    questionable mild biliary pancreatitis  . Schizophrenia, schizo-affective ([place])     HOSPITAL COURSE:   1.  Acute encephalopathy.  This is metabolic with hyperammonemia.  His Depakote was stopped which could cause a high ammonia level.  I did give a few doses of lactulose and ammonia level has come down.  Mental status improved.  Psychiatric reevaluation by Dr. Weber Cooks cleared the patient to go home and not to the psych floor. 2.  History of schizophrenia, schizoaffective disorder and bipolar disorder.  Would continue to hold Depakote and temazepam at this time.  Continue other psychiatric medications.  Follow-up with Dr. Randel Books at [facility] psychiatry.  Dr. Weber Cooks cleared to go home and not to the psychiatric floor 3.  GERD on Protonix 4.  Hold hydrochlorothiazide 5.  Chronic thrombocytopenia.  Continue to watch as outpatient.  I will try to add on a hepatitis C profile to his morning labs. 6.  Impaired fasting glucose.  Last hemoglobin A1c a few months ago was 4.6.  Patient not a diabetic.  DISCHARGE CONDITIONS:   Satisfactory  CONSULTS OBTAINED:  Treatment Team:  Clapacs, Madie Reno, MD  DRUG ALLERGIES:   Allergies  Allergen Reactions  . Haldol [Haloperidol Lactate] Other (See Comments)    Tardive dyskinesia.    DISCHARGE MEDICATIONS:   Allergies as of 10/16/2017      Reactions   Haldol  [haloperidol Lactate] Other (See Comments)   Tardive dyskinesia.      Medication List    STOP taking these medications   divalproex 500 MG 24 hr tablet Commonly known as:  DEPAKOTE ER   hydrochlorothiazide 12.5 MG capsule Commonly known as:  MICROZIDE   magic mouthwash Soln   temazepam 15 MG capsule Commonly known as:  RESTORIL     TAKE these medications   clozapine 200 MG tablet Commonly known as:  CLOZARIL Take 1 tablet (200 mg total) by mouth at bedtime.   docusate sodium 100 MG capsule Commonly known as:  COLACE Take 1 capsule (100 mg total) by mouth daily.   glycopyrrolate 2 MG tablet Commonly known as:  ROBINUL Take 2 mg by mouth at bedtime.   mirtazapine 7.5 MG tablet Commonly known as:  REMERON Take 7.5 mg by mouth at bedtime.   nicotine 21 mg/24hr patch Commonly known as:  NICODERM CQ - dosed in mg/24 hours Place 1 patch (21 mg total) onto the skin daily. Okay for generic patch Start taking on:  10/17/2017   omeprazole 20 MG capsule Commonly known as:  PRILOSEC Take 1 capsule (20 mg total) by mouth daily.   paliperidone 156 MG/ML Susp injection Commonly known as:  INVEGA SUSTENNA Inject 1 mL (156 mg total) into the muscle every 30 (thirty) days.   polyethylene glycol packet Commonly known as:  MIRALAX / GLYCOLAX Take 17 g by mouth daily.   propranolol 20 MG tablet Commonly  known as:  INDERAL Take 1 tablet (20 mg total) by mouth 3 (three) times daily.   QUEtiapine 400 MG tablet Commonly known as:  SEROQUEL Take 400 mg by mouth at bedtime.        DISCHARGE INSTRUCTIONS:   Follow-up PMD 6 days Follow-up psychiatry 1 week  If you experience worsening of your admission symptoms, develop shortness of breath, life threatening emergency, suicidal or homicidal thoughts you must seek medical attention immediately by calling 911 or calling your MD immediately  if symptoms less severe.  You Must read complete instructions/literature along with all  the possible adverse reactions/side effects for all the Medicines you take and that have been prescribed to you. Take any new Medicines after you have completely understood and accept all the possible adverse reactions/side effects.   Please note  You were cared for by a hospitalist during your hospital stay. If you have any questions about your discharge medications or the care you received while you were in the hospital after you are discharged, you can call the unit and asked to speak with the hospitalist on call if the hospitalist that took care of you is not available. Once you are discharged, your primary care physician will handle any further medical issues. Please note that NO REFILLS for any discharge medications will be authorized once you are discharged, as it is imperative that you return to your primary care physician (or establish a relationship with a primary care physician if you do not have one) for your aftercare needs so that they can reassess your need for medications and monitor your lab values.    Today   CHIEF COMPLAINT:   Chief Complaint  Patient presents with  . Altered Mental Status  . Cough    HISTORY OF PRESENT ILLNESS:  Isaiah Peterson  is a 42 y.o. male came in with altered mental status and cough   VITAL SIGNS:  Blood pressure 138/86, pulse 68, temperature 97.8 F (36.6 C), temperature source Oral, resp. rate 18, height 5\' 10"  (1.778 m), weight 65.8 kg (145 lb 1.6 oz), SpO2 97 %.   PHYSICAL EXAMINATION:  GENERAL:  42 y.o.-year-old patient lying in the bed with no acute distress.  EYES: Pupils equal, round, reactive to light and accommodation. No scleral icterus. Extraocular muscles intact.  HEENT: Head atraumatic, normocephalic. Oropharynx and nasopharynx clear.  NECK:  Supple, no jugular venous distention. No thyroid enlargement, no tenderness.  LUNGS: Normal breath sounds bilaterally, no wheezing, rales,rhonchi or crepitation. No use of accessory muscles  of respiration.  CARDIOVASCULAR: S1, S2 normal. No murmurs, rubs, or gallops.  ABDOMEN: Soft, non-tender, non-distended. Bowel sounds present. No organomegaly or mass.  EXTREMITIES: No pedal edema, cyanosis, or clubbing.  NEUROLOGIC: Cranial nerves II through XII are intact. Muscle strength 5/5 in all extremities. Sensation intact. Gait not checked.  PSYCHIATRIC: The patient is alert and answers questions today.  SKIN: No obvious rash, lesion, or ulcer.   DATA REVIEW:   CBC Recent Labs  Lab 10/15/17 0525  WBC 12.5*  HGB 12.6*  HCT 36.3*  PLT 139*    Chemistries  Recent Labs  Lab 10/14/17 1119 10/15/17 0525  NA 142 142  K 4.3 3.5  CL 109 111  CO2 26 24  GLUCOSE 116* 128*  BUN 14 9  CREATININE 1.06 1.21  CALCIUM 8.9 8.3*  AST 15  --   ALT 8  --   ALKPHOS 48  --   BILITOT 0.6  --  Cardiac Enzymes Recent Labs  Lab 10/14/17 1119  TROPONINI <0.03    Microbiology Results  Results for orders placed or performed during the hospital encounter of 01/05/13  Rapid strep screen     Status: None   Collection Time: 01/05/13  9:32 PM  Result Value Ref Range Status   Streptococcus, Group A Screen (Direct) NEGATIVE NEGATIVE Final    Comment: (NOTE) A Rapid Antigen test may result negative if the antigen level in the sample is below the detection level of this test. The FDA has not cleared this test as a stand-alone test therefore the rapid antigen negative result has reflexed to a Group A Strep culture.  Culture, Group A Strep     Status: None   Collection Time: 01/05/13  9:32 PM  Result Value Ref Range Status   Specimen Description THROAT  Final   Special Requests CX ADDED AT 2151 ON 482500  Final   Culture   Final    No Beta Hemolytic Streptococci Isolated Performed at [facility]   Report Status 01/07/2013 FINAL  Final       Management plans discussed with the patient, family and they are in agreement.  CODE STATUS:     Code Status Orders   (From admission, onward)        Start     Ordered   10/14/17 1800  Full code  Continuous     10/14/17 1759    Code Status History    Date Active Date Inactive Code Status Order ID Comments User Context   06/07/2017 1601 06/17/2017 1743 Full Code [id number]  Chauncey Mann, MD Inpatient   06/07/2011 1057 06/08/2011 0535 Full Code [id number]  Kathalene Frames, MD ED      TOTAL TIME TAKING CARE OF THIS PATIENT: 40 minutes.    Loletha Grayer M.D on 10/16/2017 at 2:36 PM  Between 7am to 6pm - Pager - [phone]  After 6pm go to [email or web address] - password [email or web address]  [facility] Office  [phone]  CC: Primary care physician; Ellamae Sia, MD

## 2017-10-16 NOTE — Progress Notes (Signed)
Pt discharged home today VSS, pt able to ambulate  no complaints, alert and oriented, ambulated around nurses station with no issues. IV removed.

## 2017-10-17 LAB — HEPATITIS C ANTIBODY: HCV Ab: <0.1 s/co ratio (ref 0.0–0.9)

## 2019-09-21 ENCOUNTER — Encounter: Payer: Self-pay | Admitting: *Deleted

## 2019-12-14 ENCOUNTER — Encounter: Payer: Self-pay | Admitting: Gastroenterology

## 2019-12-14 ENCOUNTER — Other Ambulatory Visit: Payer: Self-pay

## 2019-12-14 ENCOUNTER — Ambulatory Visit (INDEPENDENT_AMBULATORY_CARE_PROVIDER_SITE_OTHER): Payer: Medicare Other | Admitting: Gastroenterology

## 2019-12-14 VITALS — BP 122/78 | HR 87 | Temp 98.0°F | Ht 71.0 in | Wt 134.2 lb

## 2019-12-14 DIAGNOSIS — G8929 Other chronic pain: Secondary | ICD-10-CM

## 2019-12-14 DIAGNOSIS — R1013 Epigastric pain: Secondary | ICD-10-CM

## 2019-12-14 DIAGNOSIS — R634 Abnormal weight loss: Secondary | ICD-10-CM

## 2019-12-14 DIAGNOSIS — R6881 Early satiety: Secondary | ICD-10-CM | POA: Diagnosis not present

## 2019-12-14 MED ORDER — NA SULFATE-K SULFATE-MG SULF 17.5-3.13-1.6 GM/177ML PO SOLN: 354.0000 mL | Freq: Once | ORAL | 0 refills | Status: AC

## 2019-12-14 NOTE — Progress Notes (Signed)
Gastroenterology Consultation  Referring Provider:     Kerri Perches, PA-C Primary Care Physician:  Ellamae Sia, MD Primary Gastroenterologist:  Dr. Allen Norris     Reason for Consultation:     GERD with nausea and vomiting and weight loss        HPI:   Isaiah Peterson. is a 44 y.o. y/o male referred for consultation & management of GERD by Dr. Quay Burow, Ala Dach, MD.  This patient comes in after being seen in May by his primary care provider for nausea vomiting and weight loss.  The patient had reported weight loss and from his past records showed his weight to be 148 back in July 2020 and in May of this year he was 128.  He was treated for H. pylori and it was reported that not much had changed.  He has been noticing a decreased appetite.  His symptoms encompass nausea heartburn and vomiting for 5 days prior to seeing his PCP on May 27.  The patient has a history of schizophrenia and schizoaffective disorder.  The patient reports that he has rectal bleeding sometimes but is more concerned about his early satiety.  The patient does report that his father died in his early 20s from colon cancer and he thinks his father had colon cancer in his 29s.  There is no report of any history of stomach cancer in the family.  The patient does report that when he has pain in the epigastric area it is made better by drinking milk.  He reports that his appetite has gone down because of early satiety but he has noticed the significant weight loss.  The patient does endorse a negative HIV test.  He denies any change in bowel habits.  Past Medical History:  Diagnosis Date  . Bipolar 1 disorder ([place])   . Gallstones   . Pancreatitis    questionable mild biliary pancreatitis  . Schizophrenia, schizo-affective ([place])     Past Surgical History:  Procedure Laterality Date  . APPENDECTOMY    . CHOLECYSTECTOMY  02/28/2012   Procedure: LAPAROSCOPIC CHOLECYSTECTOMY;  Surgeon: Jamesetta So, MD;  Location:  [facility];  Service: General;  Laterality: N/A;  . ESOPHAGOGASTRODUODENOSCOPY  07/11/2006   RMR: Esophageal foreign body (chicken bone), status post removal, as described above.  Complete esophagogastroduodenoscopy not carried out  . MULTIPLE TOOTH EXTRACTIONS     due to decay, nerve damage, edentulous    Prior to Admission medications   Medication Sig Start Date End Date Taking? Authorizing Provider  cloZAPine (CLOZARIL) 200 MG tablet Take 1 tablet (200 mg total) by mouth at bedtime. 06/16/17   Pucilowska, Herma Ard B, MD  docusate sodium (COLACE) 100 MG capsule Take 1 capsule (100 mg total) by mouth daily. 06/17/17   Pucilowska, Jolanta B, MD  glycopyrrolate (ROBINUL) 2 MG tablet Take 2 mg by mouth at bedtime. 09/23/17   [provider]  mirtazapine (REMERON) 7.5 MG tablet Take 7.5 mg by mouth at bedtime. 08/13/17   [provider]  nicotine (NICODERM CQ - DOSED IN MG/24 HOURS) 21 mg/24hr patch Place 1 patch (21 mg total) onto the skin daily. Okay for generic patch 10/17/17   Loletha Grayer, MD  omeprazole (PRILOSEC) 20 MG capsule Take 1 capsule (20 mg total) by mouth daily. 06/16/17   Pucilowska, Jolanta B, MD  paliperidone (INVEGA SUSTENNA) 156 MG/ML SUSP injection Inject 1 mL (156 mg total) into the muscle every 30 (thirty) days. 06/16/17   Pucilowska,  Jolanta B, MD  polyethylene glycol (MIRALAX / GLYCOLAX) packet Take 17 g by mouth daily. 06/17/17   Pucilowska, Herma Ard B, MD  propranolol (INDERAL) 20 MG tablet Take 1 tablet (20 mg total) by mouth 3 (three) times daily. 06/16/17   Pucilowska, Jolanta B, MD  QUEtiapine (SEROQUEL) 400 MG tablet Take 400 mg by mouth at bedtime. 07/16/17   [provider]    Family History  Problem Relation Age of Onset  . Aneurysm Mother        brain  . Liver disease Mother   . Colon cancer Maternal Uncle        great uncle  . Pancreatitis Maternal Uncle        died age 44  . Stomach cancer Maternal Aunt        great aunt     Social  History   Tobacco Use  . Smoking status: Current Every Day Smoker    Packs/day: 1.50    Years: 20.00    Pack years: 30.00    Types: Cigarettes  . Smokeless tobacco: Never Used  Vaping Use  . Vaping Use: Never used  Substance Use Topics  . Alcohol use: No    Comment: former quit about 10 years ago  . Drug use: Yes    Types: Marijuana    Comment: marijuana end of Jan 2014     Allergies as of 12/14/2019 - Review Complete 10/14/2017  Allergen Reaction Noted  . Haldol [haloperidol lactate] Other (See Comments) 02/19/2012    Review of Systems:    All systems reviewed and negative except where noted in HPI.   Physical Exam:  There were no vitals taken for this visit. No LMP for male patient. General:   Alert,  Well-developed, well-nourished, pleasant and cooperative in NAD Head:  Normocephalic and atraumatic. Eyes:  Sclera clear, no icterus.   Conjunctiva pink. Ears:  Normal auditory acuity. Neck:  Supple; no masses or thyromegaly. Lungs:  Respirations even and unlabored.  Clear throughout to auscultation.   No wheezes, crackles, or rhonchi. No acute distress. Heart:  Regular rate and rhythm; no murmurs, clicks, rubs, or gallops. Abdomen:  Normal bowel sounds.  No bruits.  Soft, non-tender and non-distended without masses, hepatosplenomegaly or hernias noted.  No guarding or rebound tenderness.  Negative Carnett sign.   Rectal:  Deferred.  Pulses:  Normal pulses noted. Extremities:  No clubbing or edema.  No cyanosis. Neurologic:  Alert and oriented x3;  grossly normal neurologically. Skin:  Intact without significant lesions or rashes.  No jaundice. Lymph Nodes:  No significant cervical adenopathy. Psych:  Alert and cooperative. Normal mood and affect.  Imaging Studies: No results found.  Assessment and Plan:   Isaiah Peterson. is a 44 y.o. y/o male who comes in today with a history of a 20 pound weight loss in the last year.  The patient also has early satiety and  a father with colon cancer before the age of 43.   The patient reports that the nausea vomiting is intermittent and he has not had much recently. The patient has been told that it is recommended for him to undergo an EGD and colonoscopy.  The patient may have peptic ulcer disease versus a neoplasm as the cause of his symptoms.  The patient has been explained the plan and agrees with it.    Lucilla Lame, MD. Marval Regal    Note: This dictation was prepared with Dragon dictation along with smaller phrase technology. Any  transcriptional errors that result from this process are unintentional.

## 2019-12-14 NOTE — H&P (View-Only) (Signed)
Gastroenterology Consultation  Referring Provider:     Kerri Perches, PA-C Primary Care Physician:  Ellamae Sia, MD Primary Gastroenterologist:  Dr. Allen Norris     Reason for Consultation:     GERD with nausea and vomiting and weight loss        HPI:   Isaiah Peterson. is a 44 y.o. y/o male referred for consultation & management of GERD by Dr. Quay Burow, Ala Dach, MD.  This patient comes in after being seen in May by his primary care provider for nausea vomiting and weight loss.  The patient had reported weight loss and from his past records showed his weight to be 148 back in July 2020 and in May of this year he was 128.  He was treated for H. pylori and it was reported that not much had changed.  He has been noticing a decreased appetite.  His symptoms encompass nausea heartburn and vomiting for 5 days prior to seeing his PCP on May 27.  The patient has a history of schizophrenia and schizoaffective disorder.  The patient reports that he has rectal bleeding sometimes but is more concerned about his early satiety.  The patient does report that his father died in his early 12s from colon cancer and he thinks his father had colon cancer in his 26s.  There is no report of any history of stomach cancer in the family.  The patient does report that when he has pain in the epigastric area it is made better by drinking milk.  He reports that his appetite has gone down because of early satiety but he has noticed the significant weight loss.  The patient does endorse a negative HIV test.  He denies any change in bowel habits.  Past Medical History:  Diagnosis Date  . Bipolar 1 disorder ([place])   . Gallstones   . Pancreatitis    questionable mild biliary pancreatitis  . Schizophrenia, schizo-affective ([place])     Past Surgical History:  Procedure Laterality Date  . APPENDECTOMY    . CHOLECYSTECTOMY  02/28/2012   Procedure: LAPAROSCOPIC CHOLECYSTECTOMY;  Surgeon: Jamesetta So, MD;  Location:  [facility];  Service: General;  Laterality: N/A;  . ESOPHAGOGASTRODUODENOSCOPY  07/11/2006   RMR: Esophageal foreign body (chicken bone), status post removal, as described above.  Complete esophagogastroduodenoscopy not carried out  . MULTIPLE TOOTH EXTRACTIONS     due to decay, nerve damage, edentulous    Prior to Admission medications   Medication Sig Start Date End Date Taking? Authorizing Provider  cloZAPine (CLOZARIL) 200 MG tablet Take 1 tablet (200 mg total) by mouth at bedtime. 06/16/17   Pucilowska, Herma Ard B, MD  docusate sodium (COLACE) 100 MG capsule Take 1 capsule (100 mg total) by mouth daily. 06/17/17   Pucilowska, Jolanta B, MD  glycopyrrolate (ROBINUL) 2 MG tablet Take 2 mg by mouth at bedtime. 09/23/17   [provider]  mirtazapine (REMERON) 7.5 MG tablet Take 7.5 mg by mouth at bedtime. 08/13/17   [provider]  nicotine (NICODERM CQ - DOSED IN MG/24 HOURS) 21 mg/24hr patch Place 1 patch (21 mg total) onto the skin daily. Okay for generic patch 10/17/17   Loletha Grayer, MD  omeprazole (PRILOSEC) 20 MG capsule Take 1 capsule (20 mg total) by mouth daily. 06/16/17   Pucilowska, Jolanta B, MD  paliperidone (INVEGA SUSTENNA) 156 MG/ML SUSP injection Inject 1 mL (156 mg total) into the muscle every 30 (thirty) days. 06/16/17   Pucilowska,  Jolanta B, MD  polyethylene glycol (MIRALAX / GLYCOLAX) packet Take 17 g by mouth daily. 06/17/17   Pucilowska, Herma Ard B, MD  propranolol (INDERAL) 20 MG tablet Take 1 tablet (20 mg total) by mouth 3 (three) times daily. 06/16/17   Pucilowska, Jolanta B, MD  QUEtiapine (SEROQUEL) 400 MG tablet Take 400 mg by mouth at bedtime. 07/16/17   [provider]    Family History  Problem Relation Age of Onset  . Aneurysm Mother        brain  . Liver disease Mother   . Colon cancer Maternal Uncle        great uncle  . Pancreatitis Maternal Uncle        died age 32  . Stomach cancer Maternal Aunt        great aunt     Social  History   Tobacco Use  . Smoking status: Current Every Day Smoker    Packs/day: 1.50    Years: 20.00    Pack years: 30.00    Types: Cigarettes  . Smokeless tobacco: Never Used  Vaping Use  . Vaping Use: Never used  Substance Use Topics  . Alcohol use: No    Comment: former quit about 10 years ago  . Drug use: Yes    Types: Marijuana    Comment: marijuana end of Jan 2014     Allergies as of 12/14/2019 - Review Complete 10/14/2017  Allergen Reaction Noted  . Haldol [haloperidol lactate] Other (See Comments) 02/19/2012    Review of Systems:    All systems reviewed and negative except where noted in HPI.   Physical Exam:  There were no vitals taken for this visit. No LMP for male patient. General:   Alert,  Well-developed, well-nourished, pleasant and cooperative in NAD Head:  Normocephalic and atraumatic. Eyes:  Sclera clear, no icterus.   Conjunctiva pink. Ears:  Normal auditory acuity. Neck:  Supple; no masses or thyromegaly. Lungs:  Respirations even and unlabored.  Clear throughout to auscultation.   No wheezes, crackles, or rhonchi. No acute distress. Heart:  Regular rate and rhythm; no murmurs, clicks, rubs, or gallops. Abdomen:  Normal bowel sounds.  No bruits.  Soft, non-tender and non-distended without masses, hepatosplenomegaly or hernias noted.  No guarding or rebound tenderness.  Negative Carnett sign.   Rectal:  Deferred.  Pulses:  Normal pulses noted. Extremities:  No clubbing or edema.  No cyanosis. Neurologic:  Alert and oriented x3;  grossly normal neurologically. Skin:  Intact without significant lesions or rashes.  No jaundice. Lymph Nodes:  No significant cervical adenopathy. Psych:  Alert and cooperative. Normal mood and affect.  Imaging Studies: No results found.  Assessment and Plan:   Isaiah Peterson. is a 44 y.o. y/o male who comes in today with a history of a 20 pound weight loss in the last year.  The patient also has early satiety and  a father with colon cancer before the age of 103.   The patient reports that the nausea vomiting is intermittent and he has not had much recently. The patient has been told that it is recommended for him to undergo an EGD and colonoscopy.  The patient may have peptic ulcer disease versus a neoplasm as the cause of his symptoms.  The patient has been explained the plan and agrees with it.    Lucilla Lame, MD. Marval Regal    Note: This dictation was prepared with Dragon dictation along with smaller phrase technology. Any  transcriptional errors that result from this process are unintentional.

## 2019-12-17 ENCOUNTER — Other Ambulatory Visit: Admission: RE | Admit: 2019-12-17 | Payer: Medicaid Other | Source: Ambulatory Visit

## 2019-12-20 ENCOUNTER — Other Ambulatory Visit
Admission: RE | Admit: 2019-12-20 | Discharge: 2019-12-20 | Disposition: A | Discharge status: Home / Self Care | Payer: Medicare Other | Source: Ambulatory Visit | Attending: Gastroenterology | Admitting: Gastroenterology

## 2019-12-20 ENCOUNTER — Other Ambulatory Visit: Payer: Self-pay

## 2019-12-20 DIAGNOSIS — Z20822 Contact with and (suspected) exposure to covid-19: Secondary | ICD-10-CM | POA: Insufficient documentation

## 2019-12-20 DIAGNOSIS — Z01812 Encounter for preprocedural laboratory examination: Secondary | ICD-10-CM | POA: Diagnosis present

## 2019-12-21 ENCOUNTER — Encounter: Payer: Self-pay | Admitting: Gastroenterology

## 2019-12-21 ENCOUNTER — Encounter
Admission: RE | Disposition: A | Discharge status: Home / Self Care | Payer: Self-pay | Source: Home / Self Care | Attending: Gastroenterology

## 2019-12-21 ENCOUNTER — Ambulatory Visit: Payer: Medicare Other | Admitting: Anesthesiology

## 2019-12-21 ENCOUNTER — Ambulatory Visit
Admission: RE | Admit: 2019-12-21 | Discharge: 2019-12-21 | Disposition: A | Discharge status: Home / Self Care | Payer: Medicare Other | Attending: Gastroenterology | Admitting: Gastroenterology

## 2019-12-21 DIAGNOSIS — Z800 Family history of malignant neoplasm of digestive organs: Secondary | ICD-10-CM | POA: Diagnosis not present

## 2019-12-21 DIAGNOSIS — K219 Gastro-esophageal reflux disease without esophagitis: Secondary | ICD-10-CM | POA: Insufficient documentation

## 2019-12-21 DIAGNOSIS — D120 Benign neoplasm of cecum: Secondary | ICD-10-CM | POA: Diagnosis not present

## 2019-12-21 DIAGNOSIS — F259 Schizoaffective disorder, unspecified: Secondary | ICD-10-CM | POA: Diagnosis not present

## 2019-12-21 DIAGNOSIS — R634 Abnormal weight loss: Secondary | ICD-10-CM | POA: Insufficient documentation

## 2019-12-21 DIAGNOSIS — R112 Nausea with vomiting, unspecified: Secondary | ICD-10-CM | POA: Diagnosis not present

## 2019-12-21 DIAGNOSIS — F17210 Nicotine dependence, cigarettes, uncomplicated: Secondary | ICD-10-CM | POA: Diagnosis not present

## 2019-12-21 DIAGNOSIS — Z79899 Other long term (current) drug therapy: Secondary | ICD-10-CM | POA: Insufficient documentation

## 2019-12-21 DIAGNOSIS — K2950 Unspecified chronic gastritis without bleeding: Secondary | ICD-10-CM | POA: Diagnosis not present

## 2019-12-21 DIAGNOSIS — R1013 Epigastric pain: Secondary | ICD-10-CM

## 2019-12-21 DIAGNOSIS — J449 Chronic obstructive pulmonary disease, unspecified: Secondary | ICD-10-CM | POA: Diagnosis not present

## 2019-12-21 DIAGNOSIS — D3610 Benign neoplasm of peripheral nerves and autonomic nervous system, unspecified: Secondary | ICD-10-CM | POA: Diagnosis not present

## 2019-12-21 DIAGNOSIS — F319 Bipolar disorder, unspecified: Secondary | ICD-10-CM | POA: Insufficient documentation

## 2019-12-21 HISTORY — PX: COLONOSCOPY WITH PROPOFOL: SHX5780

## 2019-12-21 HISTORY — DX: Chronic obstructive pulmonary disease, unspecified: J44.9

## 2019-12-21 HISTORY — PX: ESOPHAGOGASTRODUODENOSCOPY (EGD) WITH PROPOFOL: SHX5813

## 2019-12-21 LAB — SARS CORONAVIRUS 2 (TAT 6-24 HRS): SARS Coronavirus 2: NEGATIVE

## 2019-12-21 SURGERY — COLONOSCOPY WITH PROPOFOL
Anesthesia: General

## 2019-12-21 MED ORDER — MIDAZOLAM HCL 2 MG/2ML IJ SOLN
INTRAMUSCULAR | Status: AC
  Filled 2019-12-21: qty 2

## 2019-12-21 MED ORDER — FENTANYL CITRATE (PF) 100 MCG/2ML IJ SOLN
INTRAMUSCULAR | Status: AC
  Filled 2019-12-21: qty 2

## 2019-12-21 MED ORDER — PROPOFOL 10 MG/ML IV BOLUS
INTRAVENOUS | Status: DC | PRN
  Administered 2019-12-21: 100 mg via INTRAVENOUS

## 2019-12-21 MED ORDER — FENTANYL CITRATE (PF) 100 MCG/2ML IJ SOLN
INTRAMUSCULAR | Status: DC | PRN
Start: 2019-12-21 — End: 2019-12-21
  Administered 2019-12-21: 50 ug via INTRAVENOUS

## 2019-12-21 MED ORDER — PROPOFOL 500 MG/50ML IV EMUL
INTRAVENOUS | Status: AC
  Filled 2019-12-21: qty 50

## 2019-12-21 MED ORDER — MIDAZOLAM HCL 2 MG/2ML IJ SOLN
INTRAMUSCULAR | Status: DC | PRN
  Administered 2019-12-21: 2 mg via INTRAVENOUS

## 2019-12-21 MED ORDER — LIDOCAINE HCL (CARDIAC) PF 100 MG/5ML IV SOSY
PREFILLED_SYRINGE | INTRAVENOUS | Status: DC | PRN
  Administered 2019-12-21: 50 mg via INTRAVENOUS

## 2019-12-21 MED ORDER — PROPOFOL 500 MG/50ML IV EMUL
INTRAVENOUS | Status: DC | PRN
  Administered 2019-12-21: 130 ug/kg/min via INTRAVENOUS

## 2019-12-21 MED ORDER — SODIUM CHLORIDE 0.9 % IV SOLN: INTRAVENOUS | Status: DC

## 2019-12-21 NOTE — Anesthesia Preprocedure Evaluation (Addendum)
Anesthesia Evaluation  Patient identified by MRN, date of birth, ID band Patient awake    Reviewed: Allergy & Precautions, H&P , NPO status , Patient's Chart, lab work & pertinent test results  History of Anesthesia Complications Negative for: history of anesthetic complications  Airway Mallampati: I  TM Distance: >3 FB     Dental  (+) Edentulous Lower, Edentulous Upper   Pulmonary neg sleep apnea, COPD,  COPD inhaler, Current Smoker,    breath sounds clear to auscultation       Cardiovascular (-) angina(-) Past MI and (-) Cardiac Stents negative cardio ROS  (-) dysrhythmias  Rhythm:regular Rate:Normal     Neuro/Psych PSYCHIATRIC DISORDERS Bipolar Disorder Schizophrenia negative neurological ROS     GI/Hepatic Neg liver ROS, GERD  Controlled,(+)     substance abuse  marijuana use,   Endo/Other  negative endocrine ROS  Renal/GU negative Renal ROS  negative genitourinary   Musculoskeletal   Abdominal   Peds  Hematology negative hematology ROS (+)   Anesthesia Other Findings Past Medical History: No date: Bipolar 1 disorder ([facility]) No date: COPD (chronic obstructive pulmonary disease) ([facility]) No date: Gallstones No date: Pancreatitis     Comment:  questionable mild biliary pancreatitis No date: Schizophrenia, schizo-affective ([place])  Past Surgical History: No date: APPENDECTOMY 02/28/2012: CHOLECYSTECTOMY     Comment:  Procedure: LAPAROSCOPIC CHOLECYSTECTOMY;  Surgeon: Jamesetta So, MD;  Location: [facility];  Service: General;                Laterality: N/A; 07/11/2006: ESOPHAGOGASTRODUODENOSCOPY     Comment:  RMR: Esophageal foreign body (chicken bone), status post              removal, as described above.  Complete               esophagogastroduodenoscopy not carried out No date: MULTIPLE TOOTH EXTRACTIONS     Comment:  due to decay, nerve damage, edentulous      Reproductive/Obstetrics negative OB ROS                            Anesthesia Physical Anesthesia Plan  ASA: II  Anesthesia Plan: General   Post-op Pain Management:    Induction:   PONV Risk Score and Plan: Propofol infusion and TIVA  Airway Management Planned: Nasal Cannula  Additional Equipment:   Intra-op Plan:   Post-operative Plan:   Informed Consent: I have reviewed the patients History and Physical, chart, labs and discussed the procedure including the risks, benefits and alternatives for the proposed anesthesia with the patient or authorized representative who has indicated his/her understanding and acceptance.     Dental Advisory Given  Plan Discussed with: Anesthesiologist, CRNA and Surgeon  Anesthesia Plan Comments:         Anesthesia Quick Evaluation

## 2019-12-21 NOTE — Transfer of Care (Signed)
Immediate Anesthesia Transfer of Care Note  Patient: Isaiah Peterson.  Procedure(s) Performed: COLONOSCOPY WITH PROPOFOL (N/A ) ESOPHAGOGASTRODUODENOSCOPY (EGD) WITH PROPOFOL (N/A )  Patient Location: Endoscopy Unit  Anesthesia Type:General  Level of Consciousness: drowsy and patient cooperative  Airway & Oxygen Therapy: Patient Spontanous Breathing  Post-op Assessment: Report given to RN and Post -op Vital signs reviewed and stable  Post vital signs: Reviewed and stable  Last Vitals:  Vitals Value Taken Time  BP 112/61 12/21/19 1223  Temp 36.7 C 12/21/19 1223  Pulse 61 12/21/19 1224  Resp 20 12/21/19 1224  SpO2 98 % 12/21/19 1224  Vitals shown include unvalidated device data.  Last Pain:  Vitals:   12/21/19 1223  TempSrc: Temporal  PainSc: 0-No pain         Complications: No complications documented.

## 2019-12-21 NOTE — Op Note (Signed)
[facility] Patient Name: Isaiah Peterson Procedure Date: 12/21/2019 11:27 AM MRN: [id number] Account #: [id number] Date of Birth: 04/26/1975 Admit Type: Outpatient Age: 44 Room: [facility] ENDO ROOM 1 Gender: Male Note Status: Finalized Procedure:             Upper GI endoscopy Indications:           Nausea with vomiting, Weight loss Providers:             Lucilla Lame MD, MD Referring MD:          Remus Blake MD, MD (Referring MD) Medicines:             Propofol per Anesthesia Complications:         No immediate complications. Procedure:             Pre-Anesthesia Assessment:                        - Prior to the procedure, a History and Physical was                         performed, and patient medications and allergies were                         reviewed. The patient's tolerance of previous                         anesthesia was also reviewed. The risks and benefits                         of the procedure and the sedation options and risks                         were discussed with the patient. All questions were                         answered, and informed consent was obtained. Prior                         Anticoagulants: The patient has taken no previous                         anticoagulant or antiplatelet agents. ASA Grade                         Assessment: II - A patient with mild systemic disease.                         After reviewing the risks and benefits, the patient                         was deemed in satisfactory condition to undergo the                         procedure.                        After obtaining informed consent, the endoscope was  passed under direct vision. Throughout the procedure,                         the patient's blood pressure, pulse, and oxygen                         saturations were monitored continuously. The Endoscope                         was introduced through the  mouth, and advanced to the                         second part of duodenum. The upper GI endoscopy was                         accomplished without difficulty. The patient tolerated                         the procedure well. Findings:      The examined esophagus was normal.      Segmental moderate inflammation characterized by erythema was found in       the gastric body. Biopsies were taken with a cold forceps for histology.      The examined duodenum was normal. Biopsies for histology were taken with       a cold forceps for evaluation of celiac disease. Impression:            - Normal esophagus.                        - Gastritis. Biopsied.                        - Normal examined duodenum. Biopsied. Recommendation:        - Discharge patient to home.                        - Resume previous diet.                        - Continue present medications.                        - Await pathology results. Procedure Code(s):     --- Professional ---                        [phone], Esophagogastroduodenoscopy, flexible,                         transoral; with biopsy, single or multiple Diagnosis Code(s):     --- Professional ---                        R11.2, Nausea with vomiting, unspecified                        R63.4, Abnormal weight loss                        K29.70, Gastritis, unspecified, without bleeding CPT copyright 2019 American Medical Association. All rights reserved. The codes documented in this report  are preliminary and upon coder review may  be revised to meet current compliance requirements. Lucilla Lame MD, MD 12/21/2019 12:02:28 PM This report has been signed electronically. Number of Addenda: 0 Note Initiated On: 12/21/2019 11:27 AM Estimated Blood Loss:  Estimated blood loss: none.      [facility]

## 2019-12-21 NOTE — Interval H&P Note (Signed)
History and Physical Interval Note:  12/21/2019 11:25 AM  Isaiah Peterson.  has presented today for surgery, with the diagnosis of EGD  Epigastric pain R10.13 G89.29 Colonoscopy Weight loss R63.4.  The various methods of treatment have been discussed with the patient and family. After consideration of risks, benefits and other options for treatment, the patient has consented to  Procedure(s): COLONOSCOPY WITH PROPOFOL (N/A) ESOPHAGOGASTRODUODENOSCOPY (EGD) WITH PROPOFOL (N/A) as a surgical intervention.  The patient's history has been reviewed, patient examined, no change in status, stable for surgery.  I have reviewed the patient's chart and labs.  Questions were answered to the patient's satisfaction.     Isaiah Peterson

## 2019-12-21 NOTE — Op Note (Signed)
[facility] Patient Name: Isaiah Peterson Procedure Date: 12/21/2019 11:26 AM MRN: [id number] Account #: [id number] Date of Birth: February 21, 1976 Admit Type: Outpatient Age: 44 Room: [facility] ENDO ROOM 1 Gender: Male Note Status: Finalized Procedure:             Colonoscopy Indications:           Weight loss Providers:             Lucilla Lame MD, MD Referring MD:          Remus Blake MD, MD (Referring MD) Medicines:             Propofol per Anesthesia Complications:         No immediate complications. Procedure:             Pre-Anesthesia Assessment:                        - Prior to the procedure, a History and Physical was                         performed, and patient medications and allergies were                         reviewed. The patient's tolerance of previous                         anesthesia was also reviewed. The risks and benefits                         of the procedure and the sedation options and risks                         were discussed with the patient. All questions were                         answered, and informed consent was obtained. Prior                         Anticoagulants: The patient has taken no previous                         anticoagulant or antiplatelet agents. ASA Grade                         Assessment: II - A patient with mild systemic disease.                         After reviewing the risks and benefits, the patient                         was deemed in satisfactory condition to undergo the                         procedure.                        After obtaining informed consent, the colonoscope was  passed under direct vision. Throughout the procedure,                         the patient's blood pressure, pulse, and oxygen                         saturations were monitored continuously. The                         Colonoscope was introduced through the anus and                          advanced to the the cecum, identified by appendiceal                         orifice and ileocecal valve. The colonoscopy was                         performed without difficulty. The patient tolerated                         the procedure well. The quality of the bowel                         preparation was excellent. Findings:      The perianal and digital rectal examinations were normal.      A 4 mm polyp was found in the cecum. The polyp was sessile. The polyp       was removed with a cold snare. Resection and retrieval were complete.      A 3 mm polyp was found in the sigmoid colon. The polyp was sessile. The       polyp was removed with a cold snare. Resection and retrieval were       complete. Impression:            - One 4 mm polyp in the cecum, removed with a cold                         snare. Resected and retrieved.                        - One 3 mm polyp in the sigmoid colon, removed with a                         cold snare. Resected and retrieved. Recommendation:        - Discharge patient to home.                        - Resume previous diet.                        - Continue present medications.                        - Await pathology results. Procedure Code(s):     --- Professional ---                        [phone], Colonoscopy, flexible; with removal of  tumor(s), polyp(s), or other lesion(s) by snare                         technique Diagnosis Code(s):     --- Professional ---                        R63.4, Abnormal weight loss                        K63.5, Polyp of colon CPT copyright 2019 American Medical Association. All rights reserved. The codes documented in this report are preliminary and upon coder review may  be revised to meet current compliance requirements. Lucilla Lame MD, MD 12/21/2019 12:16:39 PM This report has been signed electronically. Number of Addenda: 0 Note Initiated On: 12/21/2019 11:26 AM Scope Withdrawal Time: 0  hours 8 minutes 52 seconds  Total Procedure Duration: 0 hours 11 minutes 16 seconds  Estimated Blood Loss:  Estimated blood loss: none.      [facility]

## 2019-12-22 NOTE — Anesthesia Postprocedure Evaluation (Signed)
Anesthesia Post Note  Patient: Sue Mcalexander.  Procedure(s) Performed: COLONOSCOPY WITH PROPOFOL (N/A ) ESOPHAGOGASTRODUODENOSCOPY (EGD) WITH PROPOFOL (N/A )  Patient location during evaluation: PACU Anesthesia Type: General Level of consciousness: awake and alert Pain management: pain level controlled Vital Signs Assessment: post-procedure vital signs reviewed and stable Respiratory status: spontaneous breathing, nonlabored ventilation and respiratory function stable Cardiovascular status: blood pressure returned to baseline and stable Postop Assessment: no apparent nausea or vomiting Anesthetic complications: no   No complications documented.   Last Vitals:  Vitals:   12/21/19 1243 12/21/19 1253  BP: 113/84 96/62  Pulse: (!) 59 60  Resp: 17 (!) 21  Temp:    SpO2: 100% 100%    Last Pain:  Vitals:   12/21/19 1253  TempSrc:   PainSc: 0-No pain                 Brett Canales Abdirahim Flavell

## 2019-12-23 LAB — SURGICAL PATHOLOGY

## 2019-12-26 ENCOUNTER — Encounter: Payer: Self-pay | Admitting: Gastroenterology

## 2021-06-17 ENCOUNTER — Other Ambulatory Visit: Payer: Self-pay

## 2021-06-17 ENCOUNTER — Emergency Department
Admission: EM | Admit: 2021-06-17 | Discharge: 2021-06-17 | Disposition: A | Discharge status: Home / Self Care | Payer: Medicare Other | Attending: Emergency Medicine | Admitting: Emergency Medicine

## 2021-06-17 ENCOUNTER — Encounter: Payer: Self-pay | Admitting: Emergency Medicine

## 2021-06-17 DIAGNOSIS — R197 Diarrhea, unspecified: Secondary | ICD-10-CM | POA: Insufficient documentation

## 2021-06-17 DIAGNOSIS — R112 Nausea with vomiting, unspecified: Secondary | ICD-10-CM | POA: Diagnosis not present

## 2021-06-17 DIAGNOSIS — R61 Generalized hyperhidrosis: Secondary | ICD-10-CM | POA: Insufficient documentation

## 2021-06-17 DIAGNOSIS — Z20822 Contact with and (suspected) exposure to covid-19: Secondary | ICD-10-CM | POA: Insufficient documentation

## 2021-06-17 DIAGNOSIS — J449 Chronic obstructive pulmonary disease, unspecified: Secondary | ICD-10-CM | POA: Insufficient documentation

## 2021-06-17 DIAGNOSIS — R1031 Right lower quadrant pain: Secondary | ICD-10-CM | POA: Insufficient documentation

## 2021-06-17 LAB — RESP PANEL BY RT-PCR (FLU A&B, COVID) ARPGX2
Influenza A by PCR: NEGATIVE
Influenza B by PCR: NEGATIVE
SARS Coronavirus 2 by RT PCR: NEGATIVE

## 2021-06-17 LAB — CBG MONITORING, ED: Glucose-Capillary: 142 mg/dL — ABNORMAL HIGH (ref 70–99)

## 2021-06-17 MED ORDER — ONDANSETRON 4 MG PO TBDP
4.0000 mg | ORAL_TABLET | Freq: Once | ORAL | Status: AC
  Administered 2021-06-17: 4 mg via ORAL
  Filled 2021-06-17: qty 1

## 2021-06-17 MED ORDER — ACETAMINOPHEN 500 MG PO TABS
1000.0000 mg | ORAL_TABLET | Freq: Once | ORAL | Status: AC
  Administered 2021-06-17: 1000 mg via ORAL
  Filled 2021-06-17: qty 2

## 2021-06-17 MED ORDER — ONDANSETRON HCL 4 MG PO TABS: 4.0000 mg | ORAL_TABLET | Freq: Every day | ORAL | 0 refills | Status: AC | PRN

## 2021-06-17 NOTE — ED Triage Notes (Signed)
Pt reports is out of his invega injections and needs a refill. Pt reports usually gets it from [facility] but his insurance changed and now he needs a refill. Denies acute symptoms or the needs for an evaluation. Pt reports just needs it to prevent needing an assessment.  ?

## 2021-06-17 NOTE — ED Provider Notes (Signed)
? ?[facility] ?Provider Note ? ? ? Event Date/Time  ? First MD Initiated Contact with Patient 06/17/21 1237   ?  (approximate) ? ? ?History  ? ?Medication Refill ? ? ?HPI ? ?Isaiah Marcello Moores Waylan Busta. is a 47 y.o. male with past medical history of bipolar disorder, schizophrenia who presents concerned about withdrawal from [place].  Patient's insurance changed and he has not been able to get his Invega injections for the last 2 months, normally gets this monthly.  Over the last week he says that he started having symptoms that he is concerned about withdrawal.  Has had some nausea and vomiting and last night had cold sweats and diarrhea.  Denies any change in his schizophrenia, he chronically hears voices but says that he knows what is real and what is not and denies suicidal ideation.  Has not had fevers.  He denies any nausea or abdominal pain currently.  No nasal congestion cough shortness of breath.  Does have sick contact with someone who had a cold. ?  ? ?Past Medical History:  ?Diagnosis Date  ?? Bipolar 1 disorder ([place])   ?? COPD (chronic obstructive pulmonary disease) ([place])   ?? Gallstones   ?? Pancreatitis   ? questionable mild biliary pancreatitis  ?? Schizophrenia, schizo-affective ([place])   ? ? ?Patient Active Problem List  ? Diagnosis Date Noted  ?? Altered mental state 10/14/2017  ?? Tobacco use disorder 06/08/2017  ?? GERD (gastroesophageal reflux disease) 06/08/2017  ?? Cannabis abuse   ?? Schizoaffective disorder, bipolar type without good prognostic features ([place]) 06/07/2017  ?? Dysphagia 05/28/2012  ?? Abdominal pain, chronic, epigastric 05/28/2012  ?? Gallstones 08/06/2011  ?? Pancreatitis 08/06/2011  ?? Cannabis use disorder, moderate, dependence ([place]) 06/10/2011  ?? Schizophrenia, paranoid, chronic ([place]) 06/08/2011  ?  Class: Acute  ? ? ? ?Physical Exam  ?Triage Vital Signs: ?ED Triage Vitals  ?Enc Vitals Group  ?   BP 06/17/21 1234 121/82  ?   Pulse Rate 06/17/21 1234 69  ?    Resp 06/17/21 1234 16  ?   Temp 06/17/21 1234 97.7 ?F (36.5 ?C)  ?   Temp Source 06/17/21 1234 Oral  ?   SpO2 06/17/21 1234 96 %  ?   Weight 06/17/21 1229 135 lb (61.2 kg)  ?   Height 06/17/21 1229 '5\' 11"'$  (1.803 m)  ?   Head Circumference --   ?   Peak Flow --   ?   Pain Score 06/17/21 1228 0  ?   Pain Loc --   ?   Pain Edu? --   ?   Excl. in [place]? --   ? ? ?Most recent vital signs: ?Vitals:  ? 06/17/21 1234  ?BP: 121/82  ?Pulse: 69  ?Resp: 16  ?Temp: 97.7 ?F (36.5 ?C)  ?SpO2: 96%  ? ? ? ?General: Awake, no distress.  ?CV:  Good peripheral perfusion.  ?Resp:  Normal effort.  Lungs are clear ?Abd:  No distention.  Soft, minimal tenderness in the right lower quadrant he is not in any pain without palpation ?Neuro:             Awake, Alert, Oriented x 3  ?Other:  Somewhat dry mucous membranes ? ? ?ED Results / Procedures / Treatments  ?Labs ?(all labs ordered are listed, but only abnormal results are displayed) ?Labs Reviewed  ?RESP PANEL BY RT-PCR (FLU A&B, COVID) [id number]  ?CBG MONITORING, ED  ? ? ? ?EKG ? ? ? ? ?  RADIOLOGY ? ? ? ?PROCEDURES: ? ?Critical Care performed: No ? ?Procedures ? ? ?MEDICATIONS ORDERED IN ED: ?Medications  ?ondansetron (ZOFRAN-ODT) disintegrating tablet 4 mg (has no administration in time range)  ?acetaminophen (TYLENOL) tablet 1,000 mg (has no administration in time range)  ? ? ? ?IMPRESSION / MDM / ASSESSMENT AND PLAN / ED COURSE  ?I reviewed the triage vital signs and the nursing notes. ?             ?               ? ?Differential diagnosis includes, but is not limited to, viral gastroenteritis, bacterial enteritis, less likely medication withdrawal,  ? ?Patient is a 46 year old male with a history of schizophrenia who presents today concerned about withdrawal from his Invega injections.  Normally he gets this once monthly but due to his insurance changing and the papers not being sent to [place] he has not had his medication in 2 months.  Last night had several episodes of vomiting and had  diarrhea and cold sweats.  He is concerned that this could be withdrawal from the [place].  From a schizophrenia standpoint he does not appear to be decompensated denies any new symptoms he has hallucinations chronically but no change with this.  He is mentating normally.  On exam somewhat dry mucous membranes but clear lungs and abdomen is overall benign with some slight tenderness in the right lower quadrant however he has a history of an appendectomy my suspicion for acute surgical abdomen is quite low.  Suspect a viral illness.  Will give Zofran check a blood sugar and make sure he is tolerating p.o.  I explained to the patient that is not appropriate for me to prescribe the Invega from the ED and he will really need to wait for psychiatry to do this.  He does not have any acute psychiatric needs at this point. ? ?Clinical Course as of 06/17/21 1324  ?Nancy Fetter Jun 17, 2021  ?1324 Glucose-Capillary(!): 142 [KM]  ?  ?Clinical Course User Index ?[KM] Rada Hay, MD  ? ? ? ?FINAL CLINICAL IMPRESSION(S) / ED DIAGNOSES  ? ?Final diagnoses:  ?Nausea and vomiting, unspecified vomiting type  ? ? ? ?Rx / DC Orders  ? ?ED Discharge Orders   ? ? None  ? ?  ? ? ? ?Note:  This document was prepared using Dragon voice recognition software and may include unintentional dictation errors. ?  ?Rada Hay, MD ?06/17/21 1316 ? ?

## 2021-11-08 ENCOUNTER — Encounter: Payer: Self-pay | Admitting: Physician Assistant

## 2021-11-12 ENCOUNTER — Inpatient Hospital Stay: Payer: Medicare Other

## 2021-11-12 ENCOUNTER — Inpatient Hospital Stay: Payer: Medicare Other | Attending: Oncology | Admitting: Oncology

## 2021-11-12 VITALS — BP 112/76 | HR 50 | Temp 97.1°F | Resp 18 | Wt 134.0 lb

## 2021-11-12 DIAGNOSIS — Z8379 Family history of other diseases of the digestive system: Secondary | ICD-10-CM | POA: Diagnosis not present

## 2021-11-12 DIAGNOSIS — Z79899 Other long term (current) drug therapy: Secondary | ICD-10-CM | POA: Diagnosis not present

## 2021-11-12 DIAGNOSIS — R61 Generalized hyperhidrosis: Secondary | ICD-10-CM | POA: Insufficient documentation

## 2021-11-12 DIAGNOSIS — R634 Abnormal weight loss: Secondary | ICD-10-CM | POA: Diagnosis not present

## 2021-11-12 DIAGNOSIS — Z888 Allergy status to other drugs, medicaments and biological substances status: Secondary | ICD-10-CM | POA: Diagnosis not present

## 2021-11-12 DIAGNOSIS — D696 Thrombocytopenia, unspecified: Secondary | ICD-10-CM

## 2021-11-12 DIAGNOSIS — Z800 Family history of malignant neoplasm of digestive organs: Secondary | ICD-10-CM | POA: Insufficient documentation

## 2021-11-12 DIAGNOSIS — F1290 Cannabis use, unspecified, uncomplicated: Secondary | ICD-10-CM | POA: Insufficient documentation

## 2021-11-12 DIAGNOSIS — F17210 Nicotine dependence, cigarettes, uncomplicated: Secondary | ICD-10-CM | POA: Insufficient documentation

## 2021-11-12 DIAGNOSIS — F200 Paranoid schizophrenia: Secondary | ICD-10-CM | POA: Diagnosis not present

## 2021-11-12 DIAGNOSIS — F209 Schizophrenia, unspecified: Secondary | ICD-10-CM | POA: Insufficient documentation

## 2021-11-12 DIAGNOSIS — Z8249 Family history of ischemic heart disease and other diseases of the circulatory system: Secondary | ICD-10-CM | POA: Insufficient documentation

## 2021-11-12 DIAGNOSIS — R630 Anorexia: Secondary | ICD-10-CM | POA: Insufficient documentation

## 2021-11-12 DIAGNOSIS — Z9049 Acquired absence of other specified parts of digestive tract: Secondary | ICD-10-CM | POA: Diagnosis not present

## 2021-11-12 LAB — TSH: TSH: 1.547 u[IU]/mL (ref 0.350–4.500)

## 2021-11-12 LAB — CBC WITH DIFFERENTIAL/PLATELET
Abs Immature Granulocytes: 0.02 10*3/uL (ref 0.00–0.07)
Basophils Absolute: 0.1 10*3/uL (ref 0.0–0.1)
Basophils Relative: 1 %
Eosinophils Absolute: 0.2 10*3/uL (ref 0.0–0.5)
Eosinophils Relative: 3 %
HCT: 41.2 % (ref 39.0–52.0)
Hemoglobin: 14 g/dL (ref 13.0–17.0)
Immature Granulocytes: 0 %
Lymphocytes Relative: 21 %
Lymphs Abs: 1.5 10*3/uL (ref 0.7–4.0)
MCH: 32.3 pg (ref 26.0–34.0)
MCHC: 34 g/dL (ref 30.0–36.0)
MCV: 95.2 fL (ref 80.0–100.0)
Monocytes Absolute: 0.6 10*3/uL (ref 0.1–1.0)
Monocytes Relative: 8 %
Neutro Abs: 4.7 10*3/uL (ref 1.7–7.7)
Neutrophils Relative %: 67 %
Platelets: 134 10*3/uL — ABNORMAL LOW (ref 150–400)
RBC: 4.33 MIL/uL (ref 4.22–5.81)
RDW: 12 % (ref 11.5–15.5)
WBC: 7 10*3/uL (ref 4.0–10.5)
nRBC: 0 % (ref 0.0–0.2)

## 2021-11-12 LAB — COMPREHENSIVE METABOLIC PANEL
ALT: 10 U/L (ref 0–44)
AST: 16 U/L (ref 15–41)
Albumin: 4.1 g/dL (ref 3.5–5.0)
Alkaline Phosphatase: 55 U/L (ref 38–126)
Anion gap: 5 (ref 5–15)
BUN: 9 mg/dL (ref 6–20)
CO2: 30 mmol/L (ref 22–32)
Calcium: 9.1 mg/dL (ref 8.9–10.3)
Chloride: 104 mmol/L (ref 98–111)
Creatinine, Ser: 1.11 mg/dL (ref 0.61–1.24)
GFR, Estimated: >60 mL/min (ref >60)
Glucose, Bld: 100 mg/dL — ABNORMAL HIGH (ref 70–99)
Potassium: 4.5 mmol/L (ref 3.5–5.1)
Sodium: 139 mmol/L (ref 135–145)
Total Bilirubin: 0.3 mg/dL (ref 0.3–1.2)
Total Protein: 6.8 g/dL (ref 6.5–8.1)

## 2021-11-12 LAB — FERRITIN: Ferritin: 135 ng/mL (ref 24–336)

## 2021-11-12 LAB — IRON AND TIBC
Iron: 54 ug/dL (ref 45–182)
Saturation Ratios: 17 % — ABNORMAL LOW (ref 17.9–39.5)
TIBC: 316 ug/dL (ref 250–450)
UIBC: 262 ug/dL

## 2021-11-12 LAB — HIV ANTIBODY (ROUTINE TESTING W REFLEX): HIV Screen 4th Generation wRfx: NONREACTIVE

## 2021-11-12 LAB — TECHNOLOGIST SMEAR REVIEW: Plt Morphology: DECREASED

## 2021-11-12 LAB — IMMATURE PLATELET FRACTION: Immature Platelet Fraction: 7.8 % (ref 1.2–8.6)

## 2021-11-12 LAB — LACTATE DEHYDROGENASE: LDH: 132 U/L (ref 98–192)

## 2021-11-12 LAB — FOLATE: Folate: 26 ng/mL (ref >5.9)

## 2021-11-12 LAB — VITAMIN B12: Vitamin B-12: 627 pg/mL (ref 180–914)

## 2021-11-12 NOTE — Progress Notes (Signed)
Hematology/Oncology Consult Note [phone] [phone]    Patient Care Team: Pcp, No as PCP - General Rourk, Cristopher Estimable, MD (Gastroenterology)  REFERRING PROVIDER: Kerri Perches, PA-C  CHIEF COMPLAINTS/REASON FOR VISIT:  Evaluation of thrombocytopenia  HISTORY OF PRESENTING ILLNESS:  Isaiah Azell Bill. is a 46 y.o. male who was seen in consultation at the request of Kerri Perches, PA-C for evaluation of thrombocytopenia   Patient had abnormal Labs which showed decreased platelet counts at 117.  Reviewed patient's previous labs. Thrombocytopenia is chronic chronic onset , since at least 2009.  Denies fever, chills, fatigue, patient reports weight loss, he only eats 1 meal per day due to lack of appetite. + Night sweats.  Patient lives by himself.  He has schizophrenia, he  has quetiapine, Congentin and Invega, Lamotrigen on his medication list.  Patient reports that medication is controlling his symptoms very well.  Occasionally he does have audio hallucination.  Sometimes he sees shadows. no thoughts of hurting himself or hurting others. Denies hematochezia, hematuria, hematemesis, epistaxis, black tarry stool.  Patient has no easy bruising.    Denies history hepatitis or HIV infection Denies history of chronic liver disease Denies routine alcohol consumption.  Patient used to drink alcohol and quit 10 years ago. Denies dietary restrictions.  Denies herbal medication    MEDICAL HISTORY:  Past Medical History:  Diagnosis Date   Bipolar 1 disorder ([place])    COPD (chronic obstructive pulmonary disease) ([facility])    Gallstones    Pancreatitis    questionable mild biliary pancreatitis   Schizophrenia, schizo-affective ([place])     SURGICAL HISTORY: Past Surgical History:  Procedure Laterality Date   APPENDECTOMY     CHOLECYSTECTOMY  02/28/2012   Procedure: LAPAROSCOPIC CHOLECYSTECTOMY;  Surgeon: Jamesetta So, MD;  Location: [facility];  Service: General;   Laterality: N/A;   COLONOSCOPY WITH PROPOFOL N/A 12/21/2019   Procedure: COLONOSCOPY WITH PROPOFOL;  Surgeon: Lucilla Lame, MD;  Location: [facility] ENDOSCOPY;  Service: Endoscopy;  Laterality: N/A;   ESOPHAGOGASTRODUODENOSCOPY  07/11/2006   RMR: Esophageal foreign body (chicken bone), status post removal, as described above.  Complete esophagogastroduodenoscopy not carried out   ESOPHAGOGASTRODUODENOSCOPY (EGD) WITH PROPOFOL N/A 12/21/2019   Procedure: ESOPHAGOGASTRODUODENOSCOPY (EGD) WITH PROPOFOL;  Surgeon: Lucilla Lame, MD;  Location: [facility] ENDOSCOPY;  Service: Endoscopy;  Laterality: N/A;   MULTIPLE TOOTH EXTRACTIONS     due to decay, nerve damage, edentulous    SOCIAL HISTORY: Social History   Socioeconomic History   Marital status: Single    Spouse name: Not on file   Number of children: Not on file   Years of education: Not on file   Highest education level: Not on file  Occupational History   Not on file  Tobacco Use   Smoking status: Every Day    Packs/day: 1.50    Years: 20.00    Total pack years: 30.00    Types: Cigarettes   Smokeless tobacco: Never  Vaping Use   Vaping Use: Never used  Substance and Sexual Activity   Alcohol use: No    Comment: former quit about 10 years ago   Drug use: Yes    Types: Marijuana    Comment: marijuana end of Jan 2014    Sexual activity: Yes  Other Topics Concern   Not on file  Social History Narrative   Not on file   Social Determinants of Health   Financial Resource Strain: Not on file  Food Insecurity: Not on file  Transportation Needs: Not on file  Physical Activity: Not on file  Stress: Not on file  Social Connections: Not on file  Intimate Partner Violence: Not on file    FAMILY HISTORY: Family History  Problem Relation Age of Onset   Aneurysm Mother        brain   Liver disease Mother    Colon cancer Maternal Uncle        great uncle   Pancreatitis Maternal Uncle        died age 46   Stomach cancer Maternal  Aunt        great aunt    ALLERGIES:  is allergic to haldol [haloperidol lactate].  MEDICATIONS:  Current Outpatient Medications  Medication Sig Dispense Refill   benztropine (COGENTIN) 1 MG tablet      INVEGA SUSTENNA 234 MG/1.5ML injection Inject into the muscle.     lamoTRIgine (LAMICTAL) 100 MG tablet Take by mouth.     mirtazapine (REMERON) 7.5 MG tablet Take 7.5 mg by mouth at bedtime.  0   QUEtiapine (SEROQUEL) 100 MG tablet SMARTSIG:1 Tablet(s) By Mouth Every Evening     No current facility-administered medications for this visit.    Review of Systems  Constitutional:  Positive for appetite change and unexpected weight change. Negative for chills and fever.  HENT:   Negative for hearing loss and voice change.   Eyes:  Negative for eye problems and icterus.  Respiratory:  Negative for chest tightness, cough and shortness of breath.   Cardiovascular:  Negative for chest pain and leg swelling.  Gastrointestinal:  Negative for abdominal distention and abdominal pain.  Endocrine: Negative for hot flashes.       Night sweats  Genitourinary:  Negative for difficulty urinating, dysuria and frequency.   Musculoskeletal:  Negative for arthralgias.  Skin:  Negative for itching and rash.  Neurological:  Negative for light-headedness and numbness.  Hematological:  Negative for adenopathy. Does not bruise/bleed easily.  Psychiatric/Behavioral:  Negative for confusion.     PHYSICAL EXAMINATION: ECOG PERFORMANCE STATUS: 1 - Symptomatic but completely ambulatory Vitals:   11/12/21 1129  BP: 112/76  Pulse: (!) 50  Resp: 18  Temp: (!) 97.1 F (36.2 C)  SpO2: 100%   Filed Weights   11/12/21 1129  Weight: 134 lb (60.8 kg)    Physical Exam Constitutional:      General: He is not in acute distress. HENT:     Head: Normocephalic and atraumatic.     Mouth/Throat:     Comments: edentulous Eyes:     General: No scleral icterus. Cardiovascular:     Rate and Rhythm: Normal  rate and regular rhythm.     Heart sounds: Normal heart sounds.  Pulmonary:     Effort: Pulmonary effort is normal. No respiratory distress.     Breath sounds: No wheezing.  Abdominal:     General: Bowel sounds are normal. There is no distension.     Palpations: Abdomen is soft.  Musculoskeletal:        General: No deformity. Normal range of motion.     Cervical back: Normal range of motion and neck supple.  Skin:    General: Skin is warm and dry.     Findings: No erythema or rash.  Neurological:     Mental Status: He is alert and oriented to person, place, and time. Mental status is at baseline.     Cranial Nerves: No cranial nerve deficit.     Coordination: Coordination normal.  Psychiatric:        Mood and Affect: Mood normal.      LABORATORY DATA:  I have reviewed the data as listed    Latest Ref Rng & Units 11/12/2021   12:17 PM 10/15/2017    5:25 AM 10/14/2017   11:19 AM  CBC  WBC 4.0 - 10.5 K/uL 7.0  12.5  8.2    8.2   Hemoglobin 13.0 - 17.0 g/dL 14.0  12.6  14.3    14.1   Hematocrit 39.0 - 52.0 % 41.2  36.3  40.5    40.7   Platelets 150 - 400 K/uL 134  139  134    139       Latest Ref Rng & Units 11/12/2021   12:17 PM 10/15/2017    5:25 AM 10/14/2017   11:19 AM  CMP  Glucose 70 - 99 mg/dL 100  128  116   BUN 6 - 20 mg/dL 9  9  14    Creatinine 0.61 - 1.24 mg/dL 1.11  1.21  1.06   Sodium 135 - 145 mmol/L 139  142  142   Potassium 3.5 - 5.1 mmol/L 4.5  3.5  4.3   Chloride 98 - 111 mmol/L 104  111  109   CO2 22 - 32 mmol/L 30  24  26    Calcium 8.9 - 10.3 mg/dL 9.1  8.3  8.9   Total Protein 6.5 - 8.1 g/dL 6.8   6.7   Total Bilirubin 0.3 - 1.2 mg/dL 0.3   0.6   Alkaline Phos 38 - 126 U/L 55   48   AST 15 - 41 U/L 16   15   ALT 0 - 44 U/L 10   8      RADIOGRAPHIC STUDIES: I have personally reviewed the radiological images as listed and agreed with the findings in the report.  No results found.   ASSESSMENT & PLAN:   Schizophrenia, paranoid, chronic Check  CBC, CMP, immature platelet fraction, B12, folate, multiple myeloma panel, light chain ratio, HIV, low cytometry hepatitis panel, smear.  TSH. Check ultrasound abdomen for further evaluation.    # Patient follow-up with me in approximately 3-4 weeks to review the above results.   Orders Placed This Encounter  Procedures   US Abdomen Complete    Standing Status:   Future    Standing Expiration Date:   11/12/2022    Order Specific Question:   Reason for exam:    Answer:   Thromboctopenia, weightloss    Order Specific Question:   Preferred imaging location?    Answer:   [facility]   Comprehensive metabolic panel    Standing Status:   Future    Number of Occurrences:   1    Standing Expiration Date:   11/13/2022   CBC with Differential/Platelet    Standing Status:   Future    Number of Occurrences:   1    Standing Expiration Date:   11/13/2022   Vitamin B12    Standing Status:   Future    Number of Occurrences:   1    Standing Expiration Date:   11/13/2022   Folate    Standing Status:   Future    Number of Occurrences:   1    Standing Expiration Date:   11/13/2022   Ferritin    Standing Status:   Future    Number of Occurrences:   1    Standing Expiration Date:  05/15/2022   Iron and TIBC    Standing Status:   Future    Number of Occurrences:   1    Standing Expiration Date:   11/13/2022   Kappa/lambda light chains    Standing Status:   Future    Number of Occurrences:   1    Standing Expiration Date:   11/13/2022   Multiple Myeloma Panel (SPEP&IFE w/QIG)    Standing Status:   Future    Number of Occurrences:   1    Standing Expiration Date:   11/13/2022   Immature Platelet Fraction    Standing Status:   Future    Number of Occurrences:   1    Standing Expiration Date:   11/13/2022   Flow cytometry panel-leukemia/lymphoma work-up    Standing Status:   Future    Number of Occurrences:   1    Standing Expiration Date:   11/13/2022   Lactate dehydrogenase    Standing Status:    Future    Number of Occurrences:   1    Standing Expiration Date:   11/13/2022   HIV Antibody (routine testing w rflx)    Standing Status:   Future    Number of Occurrences:   1    Standing Expiration Date:   11/13/2022   Technologist smear review    Order Specific Question:   Clinical information:    Answer:   thrombocytopenia   TSH    Standing Status:   Future    Number of Occurrences:   1    Standing Expiration Date:   11/13/2022    All questions were answered. The patient knows to call the clinic with any problems questions or concerns.  Cc Kerri Perches, PA-C  Thank you for this kind referral and the opportunity to participate in the care of this patient. A copy of today's note is routed to referring provider    Earlie Server, MD, PhD 11/12/2021

## 2021-11-12 NOTE — Assessment & Plan Note (Signed)
Check CBC, CMP, immature platelet fraction, B12, folate, multiple myeloma panel, light chain ratio, HIV, low cytometry hepatitis panel, smear.  TSH. Check ultrasound abdomen for further evaluation.

## 2021-11-13 LAB — KAPPA/LAMBDA LIGHT CHAINS
Kappa free light chain: 21.6 mg/L — ABNORMAL HIGH (ref 3.3–19.4)
Kappa, lambda light chain ratio: 1.57 (ref 0.26–1.65)
Lambda free light chains: 13.8 mg/L (ref 5.7–26.3)

## 2021-11-14 LAB — COMP PANEL: LEUKEMIA/LYMPHOMA

## 2021-11-16 LAB — MULTIPLE MYELOMA PANEL, SERUM
Albumin SerPl Elph-Mcnc: 3.6 g/dL (ref 2.9–4.4)
Albumin/Glob SerPl: 1.5 (ref 0.7–1.7)
Alpha 1: 0.3 g/dL (ref 0.0–0.4)
Alpha2 Glob SerPl Elph-Mcnc: 0.6 g/dL (ref 0.4–1.0)
B-Globulin SerPl Elph-Mcnc: 0.8 g/dL (ref 0.7–1.3)
Gamma Glob SerPl Elph-Mcnc: 0.8 g/dL (ref 0.4–1.8)
Globulin, Total: 2.5 g/dL (ref 2.2–3.9)
IgA: 89 mg/dL — ABNORMAL LOW (ref 90–386)
IgG (Immunoglobin G), Serum: 816 mg/dL (ref 603–1613)
IgM (Immunoglobulin M), Srm: 57 mg/dL (ref 20–172)
Total Protein ELP: 6.1 g/dL (ref 6.0–8.5)

## 2021-11-19 ENCOUNTER — Ambulatory Visit
Admission: RE | Admit: 2021-11-19 | Discharge: 2021-11-19 | Disposition: A | Discharge status: Home / Self Care | Payer: Medicare Other | Source: Ambulatory Visit | Attending: Oncology | Admitting: Oncology

## 2021-11-19 DIAGNOSIS — D696 Thrombocytopenia, unspecified: Secondary | ICD-10-CM | POA: Diagnosis present

## 2021-11-19 DIAGNOSIS — R634 Abnormal weight loss: Secondary | ICD-10-CM | POA: Insufficient documentation

## 2021-12-11 ENCOUNTER — Encounter: Payer: Self-pay | Admitting: Oncology

## 2021-12-11 ENCOUNTER — Inpatient Hospital Stay: Payer: Medicare Other | Attending: Oncology | Admitting: Oncology

## 2021-12-11 VITALS — BP 121/76 | HR 69 | Temp 96.5°F | Resp 18 | Wt 124.8 lb

## 2021-12-11 DIAGNOSIS — Z888 Allergy status to other drugs, medicaments and biological substances status: Secondary | ICD-10-CM | POA: Insufficient documentation

## 2021-12-11 DIAGNOSIS — F1290 Cannabis use, unspecified, uncomplicated: Secondary | ICD-10-CM | POA: Diagnosis not present

## 2021-12-11 DIAGNOSIS — F319 Bipolar disorder, unspecified: Secondary | ICD-10-CM | POA: Diagnosis not present

## 2021-12-11 DIAGNOSIS — Z79899 Other long term (current) drug therapy: Secondary | ICD-10-CM | POA: Diagnosis not present

## 2021-12-11 DIAGNOSIS — Z9049 Acquired absence of other specified parts of digestive tract: Secondary | ICD-10-CM | POA: Diagnosis not present

## 2021-12-11 DIAGNOSIS — Z800 Family history of malignant neoplasm of digestive organs: Secondary | ICD-10-CM | POA: Diagnosis not present

## 2021-12-11 DIAGNOSIS — Z8379 Family history of other diseases of the digestive system: Secondary | ICD-10-CM | POA: Insufficient documentation

## 2021-12-11 DIAGNOSIS — R634 Abnormal weight loss: Secondary | ICD-10-CM | POA: Insufficient documentation

## 2021-12-11 DIAGNOSIS — D696 Thrombocytopenia, unspecified: Secondary | ICD-10-CM | POA: Insufficient documentation

## 2021-12-11 DIAGNOSIS — F17210 Nicotine dependence, cigarettes, uncomplicated: Secondary | ICD-10-CM | POA: Insufficient documentation

## 2021-12-11 DIAGNOSIS — Z8249 Family history of ischemic heart disease and other diseases of the circulatory system: Secondary | ICD-10-CM | POA: Diagnosis not present

## 2021-12-11 DIAGNOSIS — F209 Schizophrenia, unspecified: Secondary | ICD-10-CM | POA: Diagnosis not present

## 2021-12-11 NOTE — Assessment & Plan Note (Signed)
Recommend CT chest abdomen pelvis for further evaluation.

## 2021-12-11 NOTE — Assessment & Plan Note (Signed)
Labs reviewed and discussed with patient. Chronic thrombocytopenia, counts are stable at his baseline. Negative peripheral blood flow cytometry, negative multiple myeloma panel, normal thyroid function.  Normal LDH, adequate B12 and folate level.  Ultrasound showed no hepatosplenomegaly.  Recommend observation.

## 2021-12-11 NOTE — Progress Notes (Signed)
Hematology/Oncology Consult Note [phone] Fax:(336) [phone]    Patient Care Team: Center, [facility] as PCP - General ([place]) Gala Romney, Cristopher Estimable, MD (Gastroenterology)  REFERRING PROVIDER: Kerri Perches, PA-C  CHIEF COMPLAINTS/REASON FOR VISIT:  Evaluation of thrombocytopenia  HISTORY OF PRESENTING ILLNESS:  Isaiah Peterson. is a 46 y.o. male who was seen in consultation at the request of Kerri Perches, PA-C for evaluation of thrombocytopenia   Patient had abnormal Labs which showed decreased platelet counts at 117.  Reviewed patient's previous labs. Thrombocytopenia is chronic chronic onset , since at least 2009.  Denies fever, chills, fatigue, patient reports weight loss, he only eats 1 meal per day due to lack of appetite. + Night sweats.  Patient lives by himself.  He has schizophrenia, he  has quetiapine, Congentin and Invega, Lamotrigen on his medication list.  Patient reports that medication is controlling his symptoms very well.  Occasionally he does have audio hallucination.  Sometimes he sees shadows. no thoughts of hurting himself or hurting others. Denies hematochezia, hematuria, hematemesis, epistaxis, black tarry stool.  Patient has no easy bruising.    Denies history hepatitis or HIV infection Denies history of chronic liver disease Denies routine alcohol consumption.  Patient used to drink alcohol and quit 10 years ago. Denies dietary restrictions.  Denies herbal medication  INTERVAL HISTORY Isaiah Peterson. is a 46 y.o. male who has above history reviewed by me today presents for follow up visit for thrombocytopenia, unintentional weight loss.  He presents to review lab results.  To have weight loss.  Lost 10 pounds since his appointment 3 weeks ago.    MEDICAL HISTORY:  Past Medical History:  Diagnosis Date   Bipolar 1 disorder ([place])    COPD (chronic obstructive pulmonary disease) ([place])     Gallstones    Pancreatitis    questionable mild biliary pancreatitis   Schizophrenia, schizo-affective ([place])     SURGICAL HISTORY: Past Surgical History:  Procedure Laterality Date   APPENDECTOMY     CHOLECYSTECTOMY  02/28/2012   Procedure: LAPAROSCOPIC CHOLECYSTECTOMY;  Surgeon: Jamesetta So, MD;  Location: [facility];  Service: General;  Laterality: N/A;   COLONOSCOPY WITH PROPOFOL N/A 12/21/2019   Procedure: COLONOSCOPY WITH PROPOFOL;  Surgeon: Lucilla Lame, MD;  Location: [facility] ENDOSCOPY;  Service: Endoscopy;  Laterality: N/A;   ESOPHAGOGASTRODUODENOSCOPY  07/11/2006   RMR: Esophageal foreign body (chicken bone), status post removal, as described above.  Complete esophagogastroduodenoscopy not carried out   ESOPHAGOGASTRODUODENOSCOPY (EGD) WITH PROPOFOL N/A 12/21/2019   Procedure: ESOPHAGOGASTRODUODENOSCOPY (EGD) WITH PROPOFOL;  Surgeon: Lucilla Lame, MD;  Location: [facility] ENDOSCOPY;  Service: Endoscopy;  Laterality: N/A;   MULTIPLE TOOTH EXTRACTIONS     due to decay, nerve damage, edentulous    SOCIAL HISTORY: Social History   Socioeconomic History   Marital status: Single    Spouse name: Not on file   Number of children: Not on file   Years of education: Not on file   Highest education level: Not on file  Occupational History   Not on file  Tobacco Use   Smoking status: Every Day    Packs/day: 1.50    Years: 20.00    Total pack years: 30.00    Types: Cigarettes   Smokeless tobacco: Never  Vaping Use   Vaping Use: Never used  Substance and Sexual Activity   Alcohol use: No    Comment: former quit about 10 years ago   Drug use: Yes  Types: Marijuana    Comment: marijuana end of Jan 2014    Sexual activity: Yes  Other Topics Concern   Not on file  Social History Narrative   Not on file   Social Determinants of Health   Financial Resource Strain: Not on file  Food Insecurity: Not on file  Transportation Needs: Not on file  Physical Activity: Not on file   Stress: Not on file  Social Connections: Not on file  Intimate Partner Violence: Not on file    FAMILY HISTORY: Family History  Problem Relation Age of Onset   Aneurysm Mother        brain   Liver disease Mother    Colon cancer Maternal Uncle        great uncle   Pancreatitis Maternal Uncle        died age 14   Stomach cancer Maternal Aunt        great aunt    ALLERGIES:  is allergic to haldol [haloperidol lactate].  MEDICATIONS:  Current Outpatient Medications  Medication Sig Dispense Refill   benztropine (COGENTIN) 1 MG tablet      INVEGA SUSTENNA 234 MG/1.5ML injection Inject into the muscle.     lamoTRIgine (LAMICTAL) 100 MG tablet Take 200 mg by mouth at bedtime.     mirtazapine (REMERON) 30 MG tablet Take 30 mg by mouth at bedtime.     No current facility-administered medications for this visit.    Review of Systems  Constitutional:  Positive for appetite change and unexpected weight change. Negative for chills and fever.  HENT:   Negative for hearing loss and voice change.   Eyes:  Negative for eye problems and icterus.  Respiratory:  Negative for chest tightness, cough and shortness of breath.   Cardiovascular:  Negative for chest pain and leg swelling.  Gastrointestinal:  Negative for abdominal distention and abdominal pain.  Endocrine: Negative for hot flashes.       Night sweats  Genitourinary:  Negative for difficulty urinating, dysuria and frequency.   Musculoskeletal:  Negative for arthralgias.  Skin:  Negative for itching and rash.  Neurological:  Negative for light-headedness and numbness.  Hematological:  Negative for adenopathy. Does not bruise/bleed easily.  Psychiatric/Behavioral:  Negative for confusion.     PHYSICAL EXAMINATION: ECOG PERFORMANCE STATUS: 1 - Symptomatic but completely ambulatory Vitals:   12/11/21 1415  BP: 121/76  Pulse: 69  Resp: 18  Temp: (!) 96.5 F (35.8 C)   Filed Weights   12/11/21 1415  Weight: 124 lb 12.8 oz  (56.6 kg)    Physical Exam Constitutional:      General: He is not in acute distress. HENT:     Head: Normocephalic and atraumatic.     Mouth/Throat:     Comments: edentulous Eyes:     General: No scleral icterus. Cardiovascular:     Rate and Rhythm: Normal rate and regular rhythm.     Heart sounds: Normal heart sounds.  Pulmonary:     Effort: Pulmonary effort is normal. No respiratory distress.     Breath sounds: No wheezing.  Abdominal:     General: Bowel sounds are normal. There is no distension.     Palpations: Abdomen is soft.  Musculoskeletal:        General: No deformity. Normal range of motion.     Cervical back: Normal range of motion and neck supple.  Skin:    General: Skin is warm and dry.     Findings: No  erythema or rash.  Neurological:     Mental Status: He is alert and oriented to person, place, and time. Mental status is at baseline.     Cranial Nerves: No cranial nerve deficit.     Coordination: Coordination normal.  Psychiatric:        Mood and Affect: Mood normal.      LABORATORY DATA:  I have reviewed the data as listed    Latest Ref Rng & Units 11/12/2021   12:17 PM 10/15/2017    5:25 AM 10/14/2017   11:19 AM  CBC  WBC 4.0 - 10.5 K/uL 7.0  12.5  8.2    8.2   Hemoglobin 13.0 - 17.0 g/dL 14.0  12.6  14.3    14.1   Hematocrit 39.0 - 52.0 % 41.2  36.3  40.5    40.7   Platelets 150 - 400 K/uL 134  139  134    139       Latest Ref Rng & Units 11/12/2021   12:17 PM 10/15/2017    5:25 AM 10/14/2017   11:19 AM  CMP  Glucose 70 - 99 mg/dL 100  128  116   BUN 6 - 20 mg/dL 9  9  14    Creatinine 0.61 - 1.24 mg/dL 1.11  1.21  1.06   Sodium 135 - 145 mmol/L 139  142  142   Potassium 3.5 - 5.1 mmol/L 4.5  3.5  4.3   Chloride 98 - 111 mmol/L 104  111  109   CO2 22 - 32 mmol/L 30  24  26    Calcium 8.9 - 10.3 mg/dL 9.1  8.3  8.9   Total Protein 6.5 - 8.1 g/dL 6.8   6.7   Total Bilirubin 0.3 - 1.2 mg/dL 0.3   0.6   Alkaline Phos 38 - 126 U/L 55   48   AST  15 - 41 U/L 16   15   ALT 0 - 44 U/L 10   8      RADIOGRAPHIC STUDIES: I have personally reviewed the radiological images as listed and agreed with the findings in the report.  US Abdomen Complete  Result Date: 11/19/2021 CLINICAL DATA:  Weight loss and thrombocytopenia EXAM: ABDOMEN ULTRASOUND COMPLETE COMPARISON:  08/01/2011 FINDINGS: Gallbladder: Surgically removed Common bile duct: Diameter: 2.8 mm Liver: No focal lesion identified. Within normal limits in parenchymal echogenicity. Portal vein is patent on color Doppler imaging with normal direction of blood flow towards the liver. IVC: No abnormality visualized. Pancreas: Visualized portion unremarkable. Spleen: Size and appearance within normal limits. Right Kidney: Length: 9.7 cm. Echogenicity within normal limits. No mass or hydronephrosis visualized. Left Kidney: Length: 9.5 cm. Echogenicity within normal limits. No mass or hydronephrosis visualized. Abdominal aorta: No aneurysm visualized. Other findings: None. IMPRESSION: Status post cholecystectomy.  No acute abnormality is noted. Electronically Signed   By: Inez Catalina M.D.   On: 11/19/2021 20:01     ASSESSMENT & PLAN:   Thrombocytopenia ([place]) Labs reviewed and discussed with patient. Chronic thrombocytopenia, counts are stable at his baseline. Negative peripheral blood flow cytometry, negative multiple myeloma panel, normal thyroid function.  Normal LDH, adequate B12 and folate level.  Ultrasound showed no hepatosplenomegaly.  Recommend observation.  Unintentional weight loss Recommend CT chest abdomen pelvis for further evaluation.    # Patient follow-up -to be determined.  Orders Placed This Encounter  Procedures   CT CHEST ABDOMEN PELVIS W CONTRAST    Standing Status:   Future  Standing Expiration Date:   12/12/2022    Order Specific Question:   Preferred imaging location?    Answer:   [facility]    Order Specific Question:   Is Oral Contrast requested for  this exam?    Answer:   Yes, Per Radiology protocol    All questions were answered. The patient knows to call the clinic with any problems questions or concerns.  Cc Kerri Perches, PA-C  Thank you for this kind referral and the opportunity to participate in the care of this patient. A copy of today's note is routed to referring provider    Earlie Server, MD, PhD 12/11/2021

## 2021-12-20 ENCOUNTER — Ambulatory Visit
Admission: RE | Admit: 2021-12-20 | Discharge: 2021-12-20 | Disposition: A | Discharge status: Home / Self Care | Payer: Medicare Other | Source: Ambulatory Visit | Attending: Oncology | Admitting: Oncology

## 2021-12-20 DIAGNOSIS — J439 Emphysema, unspecified: Secondary | ICD-10-CM | POA: Insufficient documentation

## 2021-12-20 DIAGNOSIS — N400 Enlarged prostate without lower urinary tract symptoms: Secondary | ICD-10-CM | POA: Diagnosis not present

## 2021-12-20 DIAGNOSIS — R634 Abnormal weight loss: Secondary | ICD-10-CM | POA: Diagnosis not present

## 2021-12-20 DIAGNOSIS — I700 Atherosclerosis of aorta: Secondary | ICD-10-CM | POA: Diagnosis not present

## 2021-12-20 MED ORDER — IOHEXOL 300 MG/ML  SOLN
100.0000 mL | Freq: Once | INTRAMUSCULAR | Status: AC | PRN
Start: 2021-12-20 — End: 2021-12-20
  Administered 2021-12-20: 100 mL via INTRAVENOUS

## 2021-12-24 ENCOUNTER — Telehealth: Payer: Self-pay

## 2021-12-24 DIAGNOSIS — R9389 Abnormal findings on diagnostic imaging of other specified body structures: Secondary | ICD-10-CM

## 2021-12-24 NOTE — Telephone Encounter (Signed)
-----   Message from Earlie Server, MD sent at 12/24/2021  9:02 AM EDT ----- Please let him know that CT showed soft tissue nodularity in the anterior mediastinum, recommend thoracic MRI w wo contrast for further evaluation. Thanks.

## 2021-12-24 NOTE — Telephone Encounter (Signed)
Detailed VM left on ph and mychart message sent to pt.   Please schedule Mri thoracic and inform pt of appt.

## 2022-01-03 ENCOUNTER — Ambulatory Visit
Admission: RE | Admit: 2022-01-03 | Discharge: 2022-01-03 | Disposition: A | Discharge status: Home / Self Care | Payer: Medicare Other | Source: Ambulatory Visit | Attending: Oncology | Admitting: Oncology

## 2022-01-03 ENCOUNTER — Other Ambulatory Visit: Payer: Self-pay | Admitting: Oncology

## 2022-01-03 DIAGNOSIS — R9389 Abnormal findings on diagnostic imaging of other specified body structures: Secondary | ICD-10-CM

## 2022-01-03 MED ORDER — GADOPICLENOL 0.5 MMOL/ML IV SOLN
6.0000 mL | Freq: Once | INTRAVENOUS | Status: AC | PRN
  Administered 2022-01-03: 6 mL via INTRAVENOUS

## 2022-01-07 ENCOUNTER — Telehealth: Payer: Self-pay

## 2022-01-07 DIAGNOSIS — R9389 Abnormal findings on diagnostic imaging of other specified body structures: Secondary | ICD-10-CM

## 2022-01-07 NOTE — Telephone Encounter (Signed)
Pt informed and verbalized understanding.   Please schedule pt for CT chest in 6 months  Labs/ MD a few days after labs ... Ejs

## 2022-01-07 NOTE — Telephone Encounter (Signed)
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 I recommend to repeat CT chest wo contrast in 6 months to monitor this area.  His CT scan did not reveal any etiology to explain his weight loss.  Follow up, 6 months, CT chest wo contrast  prior to labs CBC CMP LDH.

## 2022-07-09 ENCOUNTER — Ambulatory Visit
Admission: RE | Admit: 2022-07-09 | Discharge: 2022-07-09 | Disposition: A | Discharge status: Home / Self Care | Payer: 59 | Source: Ambulatory Visit | Attending: Oncology | Admitting: Oncology

## 2022-07-09 DIAGNOSIS — R9389 Abnormal findings on diagnostic imaging of other specified body structures: Secondary | ICD-10-CM

## 2022-07-15 ENCOUNTER — Encounter: Payer: Self-pay | Admitting: Oncology

## 2022-07-15 ENCOUNTER — Inpatient Hospital Stay (HOSPITAL_BASED_OUTPATIENT_CLINIC_OR_DEPARTMENT_OTHER): Payer: 59 | Admitting: Oncology

## 2022-07-15 ENCOUNTER — Inpatient Hospital Stay: Payer: 59 | Attending: Oncology

## 2022-07-15 VITALS — BP 119/74 | HR 51 | Temp 96.4°F | Resp 18 | Wt 130.5 lb

## 2022-07-15 DIAGNOSIS — Z8379 Family history of other diseases of the digestive system: Secondary | ICD-10-CM | POA: Insufficient documentation

## 2022-07-15 DIAGNOSIS — R630 Anorexia: Secondary | ICD-10-CM | POA: Diagnosis not present

## 2022-07-15 DIAGNOSIS — R634 Abnormal weight loss: Secondary | ICD-10-CM

## 2022-07-15 DIAGNOSIS — Z800 Family history of malignant neoplasm of digestive organs: Secondary | ICD-10-CM | POA: Insufficient documentation

## 2022-07-15 DIAGNOSIS — E320 Persistent hyperplasia of thymus: Secondary | ICD-10-CM | POA: Insufficient documentation

## 2022-07-15 DIAGNOSIS — Z888 Allergy status to other drugs, medicaments and biological substances status: Secondary | ICD-10-CM | POA: Insufficient documentation

## 2022-07-15 DIAGNOSIS — Z9049 Acquired absence of other specified parts of digestive tract: Secondary | ICD-10-CM | POA: Diagnosis not present

## 2022-07-15 DIAGNOSIS — Z79899 Other long term (current) drug therapy: Secondary | ICD-10-CM | POA: Diagnosis not present

## 2022-07-15 DIAGNOSIS — Z8249 Family history of ischemic heart disease and other diseases of the circulatory system: Secondary | ICD-10-CM | POA: Insufficient documentation

## 2022-07-15 DIAGNOSIS — R531 Weakness: Secondary | ICD-10-CM | POA: Diagnosis not present

## 2022-07-15 DIAGNOSIS — F17210 Nicotine dependence, cigarettes, uncomplicated: Secondary | ICD-10-CM | POA: Insufficient documentation

## 2022-07-15 DIAGNOSIS — D696 Thrombocytopenia, unspecified: Secondary | ICD-10-CM | POA: Insufficient documentation

## 2022-07-15 DIAGNOSIS — R61 Generalized hyperhidrosis: Secondary | ICD-10-CM | POA: Insufficient documentation

## 2022-07-15 DIAGNOSIS — F209 Schizophrenia, unspecified: Secondary | ICD-10-CM | POA: Insufficient documentation

## 2022-07-15 DIAGNOSIS — R9389 Abnormal findings on diagnostic imaging of other specified body structures: Secondary | ICD-10-CM

## 2022-07-15 LAB — CBC WITH DIFFERENTIAL/PLATELET
Abs Immature Granulocytes: 0.02 10*3/uL (ref 0.00–0.07)
Basophils Absolute: 0.1 10*3/uL (ref 0.0–0.1)
Basophils Relative: 1 %
Eosinophils Absolute: 0.2 10*3/uL (ref 0.0–0.5)
Eosinophils Relative: 3 %
HCT: 38.5 % — ABNORMAL LOW (ref 39.0–52.0)
Hemoglobin: 13.4 g/dL (ref 13.0–17.0)
Immature Granulocytes: 0 %
Lymphocytes Relative: 26 %
Lymphs Abs: 1.5 10*3/uL (ref 0.7–4.0)
MCH: 32.4 pg (ref 26.0–34.0)
MCHC: 34.8 g/dL (ref 30.0–36.0)
MCV: 93.2 fL (ref 80.0–100.0)
Monocytes Absolute: 0.6 10*3/uL (ref 0.1–1.0)
Monocytes Relative: 10 %
Neutro Abs: 3.6 10*3/uL (ref 1.7–7.7)
Neutrophils Relative %: 60 %
Platelets: 134 10*3/uL — ABNORMAL LOW (ref 150–400)
RBC: 4.13 MIL/uL — ABNORMAL LOW (ref 4.22–5.81)
RDW: 11.8 % (ref 11.5–15.5)
WBC: 5.9 10*3/uL (ref 4.0–10.5)
nRBC: 0 % (ref 0.0–0.2)

## 2022-07-15 LAB — COMPREHENSIVE METABOLIC PANEL
ALT: 8 U/L (ref 0–44)
AST: 15 U/L (ref 15–41)
Albumin: 4 g/dL (ref 3.5–5.0)
Alkaline Phosphatase: 47 U/L (ref 38–126)
Anion gap: 6 (ref 5–15)
BUN: 7 mg/dL (ref 6–20)
CO2: 25 mmol/L (ref 22–32)
Calcium: 8.8 mg/dL — ABNORMAL LOW (ref 8.9–10.3)
Chloride: 106 mmol/L (ref 98–111)
Creatinine, Ser: 1.15 mg/dL (ref 0.61–1.24)
GFR, Estimated: >60 mL/min (ref >60)
Glucose, Bld: 91 mg/dL (ref 70–99)
Potassium: 3.9 mmol/L (ref 3.5–5.1)
Sodium: 137 mmol/L (ref 135–145)
Total Bilirubin: 0.4 mg/dL (ref 0.3–1.2)
Total Protein: 6.6 g/dL (ref 6.5–8.1)

## 2022-07-15 LAB — LACTATE DEHYDROGENASE: LDH: 110 U/L (ref 98–192)

## 2022-07-15 NOTE — Assessment & Plan Note (Signed)
Labs reviewed and discussed with patient. Chronic thrombocytopenia, counts are stable at his baseline. Negative peripheral blood flow cytometry, negative multiple myeloma panel, normal thyroid function.  Normal LDH, adequate B12 and folate level.  Ultrasound showed no hepatosplenomegaly.  Labs reviewed and discussed with patient. Lab Results  Component Value Date   PLT 134 (L) 07/15/2022   PLT 134 (L) 11/12/2021   PLT 139 (L) 10/15/2017  Counts are stable.  I recommend observation.

## 2022-07-15 NOTE — Assessment & Plan Note (Addendum)
Patient has gained weight

## 2022-07-15 NOTE — Progress Notes (Signed)
Hematology/Oncology Progress note Telephone:(336) [phone] Fax:(336) [phone]     CHIEF COMPLAINTS/REASON FOR VISIT:  Thrombocytopenia, weight loss, mediastinal mass  ASSESSMENT & PLAN:   Thrombocytopenia ([facility]) Labs reviewed and discussed with patient. Chronic thrombocytopenia, counts are stable at his baseline. Negative peripheral blood flow cytometry, negative multiple myeloma panel, normal thyroid function.  Normal LDH, adequate B12 and folate level.  Ultrasound showed no hepatosplenomegaly.  Labs reviewed and discussed with patient. Lab Results  Component Value Date   PLT 134 (L) 07/15/2022   PLT 134 (L) 11/12/2021   PLT 139 (L) 10/15/2017  Counts are stable.  I recommend observation.  Unintentional weight loss Patient has gained weight.    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 With further questioning, he reports upper extremity weakness.  I will refer him to neurologist for further evaluation.  Rule out MG   Orders Placed This Encounter  Procedures   CBC with Differential (Cancer Center Only)    Standing Status:   Future    Standing Expiration Date:   07/15/2023   CMP (Cancer Center only)    Standing Status:   Future    Standing Expiration Date:   07/15/2023   Immature Platelet Fraction    Standing Status:   Future    Standing Expiration Date:   07/15/2023   Ambulatory referral to Neurology    Referral Priority:   Routine    Referral Type:   Consultation    Referral Reason:   Specialty Services Required    Requested Specialty:   Neurology    Number of Visits Requested:   1   Follow-up in 1 year. All questions were answered. The patient knows to call the clinic with any problems, questions or concerns.  Rickard Patience, MD, PhD [facility] Hematology Oncology 07/15/2022    HISTORY OF PRESENTING ILLNESS:  Isaiah Tribe. is a 47 y.o. male who was seen in  consultation at the request of [facility] for evaluation of thrombocytopenia   Patient had abnormal Labs which showed decreased platelet counts at 117.  Reviewed patient's previous labs. Thrombocytopenia is chronic chronic onset , since at least 2009.  Denies fever, chills, fatigue, patient reports weight loss, he only eats 1 meal per day due to lack of appetite. + Night sweats.  Patient lives by himself.  He has schizophrenia, he  has quetiapine, Congentin and Invega, Lamotrigen on his medication list.  Patient reports that medication is controlling his symptoms very well.  Occasionally he does have audio hallucination.  Sometimes he sees shadows. no thoughts of hurting himself or hurting others. Denies hematochezia, hematuria, hematemesis, epistaxis, black tarry stool.  Patient has no easy bruising.    Denies history hepatitis or HIV infection Denies history of chronic liver disease Denies routine alcohol consumption.  Patient used to drink alcohol and quit 10 years ago. Denies dietary restrictions.  Denies herbal medication  INTERVAL HISTORY Isaiah Peterson. is a 47 y.o. male who has above history reviewed by me today presents for follow up visit for thrombocytopenia, unintentional weight loss.  Patient has gained weight, 6 pounds since 6 months ago.  He reports appetite is fair. + Hand weakness, sometimes he has difficulty opening a jar    MEDICAL HISTORY:  Past Medical History:  Diagnosis Date   Bipolar 1 disorder    COPD (chronic obstructive pulmonary disease)    Gallstones  Pancreatitis    questionable mild biliary pancreatitis   Schizophrenia, schizo-affective     SURGICAL HISTORY: Past Surgical History:  Procedure Laterality Date   APPENDECTOMY     CHOLECYSTECTOMY  02/28/2012   Procedure: LAPAROSCOPIC CHOLECYSTECTOMY;  Surgeon: Dalia HeadingMark A Jenkins, MD;  Location: [facility];  Service: General;  Laterality: N/A;   COLONOSCOPY WITH PROPOFOL N/A 12/21/2019    Procedure: COLONOSCOPY WITH PROPOFOL;  Surgeon: Midge MiniumWohl, Darren, MD;  Location: [facility] ENDOSCOPY;  Service: Endoscopy;  Laterality: N/A;   ESOPHAGOGASTRODUODENOSCOPY  07/11/2006   RMR: Esophageal foreign body (chicken bone), status post removal, as described above.  Complete esophagogastroduodenoscopy not carried out   ESOPHAGOGASTRODUODENOSCOPY (EGD) WITH PROPOFOL N/A 12/21/2019   Procedure: ESOPHAGOGASTRODUODENOSCOPY (EGD) WITH PROPOFOL;  Surgeon: Midge MiniumWohl, Darren, MD;  [facility];  Service: Endoscopy;  Laterality: N/A;   MULTIPLE TOOTH EXTRACTIONS     due to decay, nerve damage, edentulous    SOCIAL HISTORY: Social History   Socioeconomic History   Marital status: Single    Spouse name: Not on file   Number of children: Not on file   Years of education: Not on file   Highest education level: Not on file  Occupational History   Not on file  Tobacco Use   Smoking status: Every Day    Packs/day: 1.50    Years: 20.00    Additional pack years: 0.00    Total pack years: 30.00    Types: Cigarettes   Smokeless tobacco: Never  Vaping Use   Vaping Use: Never used  Substance and Sexual Activity   Alcohol use: No    Comment: former quit about 10 years ago   Drug use: Yes    Types: Marijuana    Comment: marijuana end of Jan 2014    Sexual activity: Yes  Other Topics Concern   Not on file  Social History Narrative   Not on file   Social Determinants of Health   Financial Resource Strain: Not on file  Food Insecurity: Not on file  Transportation Needs: Not on file  Physical Activity: Not on file  Stress: Not on file  Social Connections: Not on file  Intimate Partner Violence: Not on file    FAMILY HISTORY: Family History  Problem Relation Age of Onset   Aneurysm Mother        brain   Liver disease Mother    Colon cancer Maternal Uncle        great uncle   Pancreatitis Maternal Uncle        died age 45   Stomach cancer Maternal Aunt        great aunt     ALLERGIES:  is allergic to haldol [haloperidol lactate].  MEDICATIONS:  Current Outpatient Medications  Medication Sig Dispense Refill   benztropine (COGENTIN) 1 MG tablet      INVEGA SUSTENNA 234 MG/1.5ML injection Inject into the muscle.     lamoTRIgine (LAMICTAL) 100 MG tablet Take 200 mg by mouth at bedtime.     mirtazapine (REMERON) 30 MG tablet Take 30 mg by mouth at bedtime.     No current facility-administered medications for this visit.    Review of Systems  Constitutional:  Positive for fatigue. Negative for appetite change, chills, fever and unexpected weight change.  HENT:   Negative for hearing loss and voice change.   Eyes:  Negative for eye problems and icterus.  Respiratory:  Negative for chest tightness, cough and shortness of breath.   Cardiovascular:  Negative for  chest pain and leg swelling.  Gastrointestinal:  Negative for abdominal distention and abdominal pain.  Endocrine: Negative for hot flashes.  Genitourinary:  Negative for difficulty urinating, dysuria and frequency.   Musculoskeletal:  Negative for arthralgias.  Skin:  Negative for itching and rash.  Neurological:  Positive for extremity weakness. Negative for light-headedness and numbness.  Hematological:  Negative for adenopathy. Does not bruise/bleed easily.  Psychiatric/Behavioral:  Negative for confusion.     PHYSICAL EXAMINATION: ECOG PERFORMANCE STATUS: 1 - Symptomatic but completely ambulatory Vitals:   07/15/22 1320  BP: 119/74  Pulse: (!) 51  Resp: 18  Temp: (!) 96.4 F (35.8 C)   Filed Weights   07/15/22 1320  Weight: 130 lb 8 oz (59.2 kg)    Physical Exam Constitutional:      General: He is not in acute distress. HENT:     Head: Normocephalic and atraumatic.     Mouth/Throat:     Comments: edentulous Eyes:     General: No scleral icterus. Cardiovascular:     Rate and Rhythm: Normal rate and regular rhythm.     Heart sounds: Normal heart sounds.  Pulmonary:      Effort: Pulmonary effort is normal. No respiratory distress.     Breath sounds: No wheezing.  Abdominal:     General: Bowel sounds are normal. There is no distension.     Palpations: Abdomen is soft.  Musculoskeletal:        General: No deformity. Normal range of motion.     Cervical back: Normal range of motion and neck supple.  Skin:    General: Skin is warm and dry.     Findings: No erythema or rash.  Neurological:     Mental Status: He is alert and oriented to person, place, and time. Mental status is at baseline.     Cranial Nerves: No cranial nerve deficit.     Coordination: Coordination normal.  Psychiatric:        Mood and Affect: Mood normal.      LABORATORY DATA:  I have reviewed the data as listed    Latest Ref Rng & Units 07/15/2022   12:53 PM 11/12/2021   12:17 PM 10/15/2017    5:25 AM  CBC  WBC 4.0 - 10.5 K/uL 5.9  7.0  12.5   Hemoglobin 13.0 - 17.0 g/dL 16.1  09.6  04.5   Hematocrit 39.0 - 52.0 % 38.5  41.2  36.3   Platelets 150 - 400 K/uL 134  134  139       Latest Ref Rng & Units 07/15/2022   12:53 PM 11/12/2021   12:17 PM 10/15/2017    5:25 AM  CMP  Glucose 70 - 99 mg/dL 91  409  811   BUN 6 - 20 mg/dL 7  9  9    Creatinine 0.61 - 1.24 mg/dL 9.14  7.82  9.56   Sodium 135 - 145 mmol/L 137  139  142   Potassium 3.5 - 5.1 mmol/L 3.9  4.5  3.5   Chloride 98 - 111 mmol/L 106  104  111   CO2 22 - 32 mmol/L 25  30  24    Calcium 8.9 - 10.3 mg/dL 8.8  9.1  8.3   Total Protein 6.5 - 8.1 g/dL 6.6  6.8    Total Bilirubin 0.3 - 1.2 mg/dL 0.4  0.3    Alkaline Phos 38 - 126 U/L 47  55    AST 15 - 41 U/L  15  16    ALT 0 - 44 U/L 8  10       RADIOGRAPHIC STUDIES: I have personally reviewed the radiological images as listed and agreed with the findings in the report.  CT Chest Wo Contrast  Result Date: 07/09/2022 CLINICAL DATA:  Thymic remnant or thymic hyperplasia EXAM: CT CHEST WITHOUT CONTRAST TECHNIQUE: Multidetector CT imaging of the chest was performed following  the standard protocol without IV contrast. RADIATION DOSE REDUCTION: This exam was performed according to the departmental dose-optimization program which includes automated exposure control, adjustment of the mA and/or kV according to patient size and/or use of iterative reconstruction technique. COMPARISON:  CT chest, abdomen and pelvis dated December 20, 2021; chest MRI dated January 03, 2022 FINDINGS: Cardiovascular: Normal heart size. No pericardial effusion. Normal caliber thoracic aorta with mild calcified plaque. Mediastinum/Nodes: Small hiatal hernia. Prior right thyroidectomy. No enlarged lymph nodes seen chest. Soft tissue of the anterior mediastinum with interspersed fat measuring 2.6 x 1.1 cm, unchanged when compared with prior exam. Lungs/Pleura: Central airways are patent. Mild centrilobular and paraseptal emphysema. No consolidation, pleural effusion or pneumothorax. Upper Abdomen: Cholecystectomy clips.  No acute abnormality. Musculoskeletal: No chest wall mass or suspicious bone lesions identified. IMPRESSION: 1. Unchanged soft tissue of the anterior mediastinum, consistent with thymic remnant or thymic hyperplasia. 2. Aortic Atherosclerosis (ICD10-I70.0) and Emphysema (ICD10-J43.9). Electronically Signed   By: Allegra Lai M.D.   On: 07/09/2022 17:45

## 2022-07-15 NOTE — Assessment & Plan Note (Addendum)
MRI and CT scans were reviewed and discussed with patient.  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 With further questioning, he reports upper extremity weakness.  I will refer him to neurologist for further evaluation.  Rule out MG

## 2023-02-08 LAB — LAB REPORT - SCANNED: EGFR: 86

## 2023-04-30 NOTE — Progress Notes (Signed)
Celso Amy, PA-C [address]  [address]  [place], [place] [address]  Main: [phone]  Fax: [phone]   Gastroenterology Consultation  Referring Provider:     Center, [facility]* Primary Care Physician:  Center, [facility] Primary Gastroenterologist:  Celso Amy, PA-C  Reason for Consultation:     Epigastric Pain        HPI:   Isaiah Peterson. is a 48 y.o. y/o male referred for consultation & management  by Center, [facility].    Current symptoms: Patient states he is having chronic worsening epigastric pain which radiates to his mid back.  He has had epigastric pain on and off for 10 years.  Getting worse.  Occurs 2-3 times a day and last several hours.  Worse at nighttime and worse after eating.  Also worse with movement.  He has been taking pantoprazole 40 Mg twice daily for 2 months with little benefit.  He takes a lot of NSAIDs including Advil, Aleve, and ibuprofen.  Takes 4-6 ibuprofen at a time.  Also takes Tylenol and Pepto-Bismol as needed.  Has noticed dark black stools on Pepto.  Has nausea but no vomiting.  Denies heartburn or chest pain.  Denies constipation.  Has occasional episode of diarrhea, depending on diet.  Patient saw hematologist Dr. Cathie Hoops to evaluate chronic thrombocytopenia, weight loss, mediastinal mass.  Last follow-up 07/2022 with Dr. Cathie Hoops.  Chronic thrombocytopenia, counts are stable at his baseline. Negative peripheral blood flow cytometry, negative multiple myeloma panel, normal thyroid function.  Normal LDH, adequate B12 and folate level.  Ultrasound showed no hepatosplenomegaly.  Weight improved and he was gaining weight.  MRI and Chest CT scan (07/2022) showed thymic hyperplasia or thymus remnant.  He was referred to neurology to rule out myasthenia gravis as a source for upper extremity weakness.  Cholecystectomy in 2013.  12/2019 colonoscopy by Dr. Servando Snare: 2 small 3 mm tubular adenoma, 4 mm  hyperplastic polyps removed.  Excellent prep.  7 year repeat.  12/2019 EGD by Dr. Servando Snare: Moderate gastritis, otherwise normal.  Biopsies negative for H. pylori, celiac, and dysplasia.  Biopsy showed moderate chronic gastritis.  12/2021: CT Chest / Abd / Pelvis w/ Contrast: thymic hyperplasia / remnant.  Enlarged prostate.  No other abnormality to explain weight loss.  02/2023: H. Pylori Stool Test Negative.  Past Medical History:  Diagnosis Date   Bipolar 1 disorder (HCC)    COPD (chronic obstructive pulmonary disease) (HCC)    Gallstones    Pancreatitis    questionable mild biliary pancreatitis   Schizophrenia, schizo-affective (HCC)     Past Surgical History:  Procedure Laterality Date   APPENDECTOMY     CHOLECYSTECTOMY  02/28/2012   Procedure: LAPAROSCOPIC CHOLECYSTECTOMY;  Surgeon: Dalia Heading, MD;  Location: [facility];  Service: General;  Laterality: N/A;   COLONOSCOPY WITH PROPOFOL N/A 12/21/2019   Procedure: COLONOSCOPY WITH PROPOFOL;  Surgeon: Midge Minium, MD;  Location: [facility] ENDOSCOPY;  Service: Endoscopy;  Laterality: N/A;   ESOPHAGOGASTRODUODENOSCOPY  07/11/2006   RMR: Esophageal foreign body (chicken bone), status post removal, as described above.  Complete esophagogastroduodenoscopy not carried out   ESOPHAGOGASTRODUODENOSCOPY (EGD) WITH PROPOFOL N/A 12/21/2019   Procedure: ESOPHAGOGASTRODUODENOSCOPY (EGD) WITH PROPOFOL;  Surgeon: Midge Minium, MD;  Location: [facility] ENDOSCOPY;  Service: Endoscopy;  Laterality: N/A;   MULTIPLE TOOTH EXTRACTIONS     due to decay, nerve damage, edentulous    Prior to Admission medications   Medication Sig  Start Date End Date Taking? Authorizing Provider  benztropine (COGENTIN) 1 MG tablet  11/10/19   [provider]  INVEGA SUSTENNA 234 MG/1.5ML injection Inject into the muscle. 10/23/21   [provider]  lamoTRIgine (LAMICTAL) 100 MG tablet Take 200 mg by mouth at bedtime. 11/02/21   [provider]  mirtazapine  (REMERON) 30 MG tablet Take 30 mg by mouth at bedtime. 12/03/21   [provider]    Family History  Problem Relation Age of Onset   Aneurysm Mother        brain   Liver disease Mother    Colon cancer Maternal Uncle        great uncle   Pancreatitis Maternal Uncle        died age 17   Stomach cancer Maternal Aunt        great aunt     Social History   Tobacco Use   Smoking status: Every Day    Current packs/day: 1.50    Average packs/day: 1.5 packs/day for 20.0 years (30.0 ttl pk-yrs)    Types: Cigarettes   Smokeless tobacco: Never  Vaping Use   Vaping status: Never Used  Substance Use Topics   Alcohol use: No    Comment: former quit about 10 years ago   Drug use: Yes    Types: Marijuana    Comment: marijuana end of Jan 2014     Allergies as of 05/01/2023 - Review Complete 05/01/2023  Allergen Reaction Noted   Haldol [haloperidol lactate] Other (See Comments) 02/19/2012    Review of Systems:    All systems reviewed and negative except where noted in HPI.   Physical Exam:  BP 133/80   Pulse 77   Temp 97.7 F (36.5 C)   Ht 5\' 11"  (1.803 m)   Wt 129 lb 6.4 oz (58.7 kg)   BMI 18.05 kg/m  No LMP for male patient.  General:   Alert,  Well-developed, thin, pleasant and cooperative in NAD Lungs:  Respirations even and unlabored.  Clear throughout to auscultation.   No wheezes, crackles, or rhonchi. No acute distress. Heart:  Regular rate and rhythm; no murmurs, clicks, rubs, or gallops. Abdomen:  Normal bowel sounds.  No bruits.  Soft, and non-distended without masses, hepatosplenomegaly or hernias noted.  Moderate Epigastric Tenderness.  No lower abdominal tenderness.  No guarding or rebound tenderness.    Neurologic:  Alert and oriented x3;  grossly normal neurologically. Psych:  Alert and cooperative. Normal mood and affect.  Imaging Studies: No results found.  Assessment and Plan:   Roger Kettles. is a 48 y.o. y/o male has been referred  for   1.  GERD  Continue pantoprazole 40 Mg twice daily.  2.  Chronic gastritis  Start Rx Carafate 1 g 3 times daily, #90, 5 refills.  3.  Chronic worsening epigastric pain and nausea; moderate daily NSAID use.  Evaluate for peptic ulcer.  Stop NSAIDs.  Stopped Pepto-Bismol which is causing dark stools.  Labs: CBC, CMP, lipase.  Scheduling EGD I discussed risks of EGD with patient to include risk of bleeding, perforation, and risk of sedation.  Patient expressed understanding and agrees to proceed with EGD.    Follow up 4 weeks after EGD procedure with TG.  Celso Amy, PA-C

## 2023-05-01 ENCOUNTER — Ambulatory Visit: Payer: 59 | Admitting: Physician Assistant

## 2023-05-01 ENCOUNTER — Encounter: Payer: Self-pay | Admitting: Physician Assistant

## 2023-05-01 VITALS — BP 133/80 | HR 77 | Temp 97.7°F | Ht 71.0 in | Wt 129.4 lb

## 2023-05-01 DIAGNOSIS — K2950 Unspecified chronic gastritis without bleeding: Secondary | ICD-10-CM | POA: Diagnosis not present

## 2023-05-01 DIAGNOSIS — K219 Gastro-esophageal reflux disease without esophagitis: Secondary | ICD-10-CM

## 2023-05-01 DIAGNOSIS — R110 Nausea: Secondary | ICD-10-CM

## 2023-05-01 DIAGNOSIS — R1013 Epigastric pain: Secondary | ICD-10-CM

## 2023-05-01 MED ORDER — SUCRALFATE 1 G PO TABS: 1.0000 g | ORAL_TABLET | Freq: Three times a day (TID) | ORAL | 5 refills | Status: DC

## 2023-05-01 NOTE — Patient Instructions (Addendum)
Continue pantoprazole 40 Mg 1 tablet twice daily.  Start prescription Carafate 1 g, take 1 tablet 3 times daily before meals.  Please avoid all NSAIDs such as ibuprofen, Advil, and Aleve.  These can cause stomach ulcers.  It is okay to take Tylenol, no more than 2000 mg (4 extra strength Tylenol) daily.  Stop Pepto-Bismol which can cause black stools.

## 2023-05-06 ENCOUNTER — Encounter: Payer: Self-pay | Admitting: Physician Assistant

## 2023-05-06 LAB — COMPREHENSIVE METABOLIC PANEL
ALT: 7 [IU]/L (ref 0–44)
AST: 12 [IU]/L (ref 0–40)
Albumin: 4.6 g/dL (ref 4.1–5.1)
Alkaline Phosphatase: 56 [IU]/L (ref 44–121)
BUN/Creatinine Ratio: 12 (ref 9–20)
BUN: 13 mg/dL (ref 6–24)
Bilirubin Total: 0.3 mg/dL (ref 0.0–1.2)
CO2: 20 mmol/L (ref 20–29)
Calcium: 9.6 mg/dL (ref 8.7–10.2)
Chloride: 103 mmol/L (ref 96–106)
Creatinine, Ser: 1.12 mg/dL (ref 0.76–1.27)
Globulin, Total: 2 g/dL (ref 1.5–4.5)
Glucose: 108 mg/dL — ABNORMAL HIGH (ref 70–99)
Potassium: 4.5 mmol/L (ref 3.5–5.2)
Sodium: 141 mmol/L (ref 134–144)
Total Protein: 6.6 g/dL (ref 6.0–8.5)
eGFR: 83 mL/min/{1.73_m2} (ref >59)

## 2023-05-06 LAB — LIPASE: Lipase: 36 U/L (ref 13–78)

## 2023-05-12 ENCOUNTER — Telehealth: Payer: Self-pay

## 2023-05-12 NOTE — Telephone Encounter (Signed)
Left detailed message-- Hi Mr. Demore,  Your labs showed normal CMP and lipase results.  Glucose, kidney test, liver test, pancreas test, and electrolytes are all normal.  Continue with plan for upper endoscopy procedure as scheduled. Celso Amy, PA-C

## 2023-05-22 ENCOUNTER — Encounter
Admission: RE | Disposition: A | Discharge status: Home / Self Care | Payer: Self-pay | Source: Home / Self Care | Attending: Gastroenterology

## 2023-05-22 ENCOUNTER — Ambulatory Visit: Payer: 59 | Admitting: Anesthesiology

## 2023-05-22 ENCOUNTER — Encounter: Payer: Self-pay | Admitting: Gastroenterology

## 2023-05-22 ENCOUNTER — Ambulatory Visit
Admission: RE | Admit: 2023-05-22 | Discharge: 2023-05-22 | Disposition: A | Discharge status: Home / Self Care | Payer: 59 | Attending: Gastroenterology | Admitting: Gastroenterology

## 2023-05-22 DIAGNOSIS — R109 Unspecified abdominal pain: Secondary | ICD-10-CM | POA: Diagnosis present

## 2023-05-22 DIAGNOSIS — F319 Bipolar disorder, unspecified: Secondary | ICD-10-CM | POA: Diagnosis not present

## 2023-05-22 DIAGNOSIS — Z800 Family history of malignant neoplasm of digestive organs: Secondary | ICD-10-CM | POA: Insufficient documentation

## 2023-05-22 DIAGNOSIS — J449 Chronic obstructive pulmonary disease, unspecified: Secondary | ICD-10-CM | POA: Diagnosis not present

## 2023-05-22 DIAGNOSIS — R1013 Epigastric pain: Secondary | ICD-10-CM | POA: Diagnosis present

## 2023-05-22 DIAGNOSIS — Z8379 Family history of other diseases of the digestive system: Secondary | ICD-10-CM | POA: Insufficient documentation

## 2023-05-22 DIAGNOSIS — K2950 Unspecified chronic gastritis without bleeding: Secondary | ICD-10-CM

## 2023-05-22 DIAGNOSIS — F17210 Nicotine dependence, cigarettes, uncomplicated: Secondary | ICD-10-CM | POA: Diagnosis not present

## 2023-05-22 HISTORY — PX: ESOPHAGOGASTRODUODENOSCOPY (EGD) WITH PROPOFOL: SHX5813

## 2023-05-22 HISTORY — PX: BIOPSY: SHX5522

## 2023-05-22 SURGERY — ESOPHAGOGASTRODUODENOSCOPY (EGD) WITH PROPOFOL
Anesthesia: General

## 2023-05-22 MED ORDER — LIDOCAINE HCL (CARDIAC) PF 100 MG/5ML IV SOSY
PREFILLED_SYRINGE | INTRAVENOUS | Status: DC | PRN
  Administered 2023-05-22: 100 mg via INTRAVENOUS

## 2023-05-22 MED ORDER — PROPOFOL 10 MG/ML IV BOLUS
INTRAVENOUS | Status: AC
  Filled 2023-05-22: qty 40

## 2023-05-22 MED ORDER — LIDOCAINE HCL (PF) 2 % IJ SOLN
INTRAMUSCULAR | Status: AC
  Filled 2023-05-22: qty 5

## 2023-05-22 MED ORDER — DEXMEDETOMIDINE HCL IN NACL 80 MCG/20ML IV SOLN
INTRAVENOUS | Status: DC | PRN
  Administered 2023-05-22: 4 ug via INTRAVENOUS

## 2023-05-22 MED ORDER — PROPOFOL 10 MG/ML IV BOLUS
INTRAVENOUS | Status: DC | PRN
  Administered 2023-05-22: 100 mg via INTRAVENOUS
  Administered 2023-05-22: 50 mg via INTRAVENOUS

## 2023-05-22 MED ORDER — SODIUM CHLORIDE 0.9 % IV SOLN: INTRAVENOUS | Status: DC

## 2023-05-22 MED ORDER — GLYCOPYRROLATE 0.2 MG/ML IJ SOLN
INTRAMUSCULAR | Status: DC | PRN
  Administered 2023-05-22: .2 mg via INTRAVENOUS

## 2023-05-22 NOTE — Transfer of Care (Signed)
Immediate Anesthesia Transfer of Care Note  Patient: Isaiah Peterson.  Procedure(s) Performed: ESOPHAGOGASTRODUODENOSCOPY (EGD) WITH PROPOFOL BIOPSY  Patient Location: PACU and Endoscopy Unit  Anesthesia Type:General  Level of Consciousness: sedated  Airway & Oxygen Therapy: Patient Spontanous Breathing  Post-op Assessment: Report given to RN and Post -op Vital signs reviewed and stable  Post vital signs: Reviewed and stable  Last Vitals:  Vitals Value Taken Time  BP 103/56 05/22/23 0826  Temp 35.7 C 05/22/23 0826  Pulse 49 05/22/23 0828  Resp 17 05/22/23 0828  SpO2 97 % 05/22/23 0828  Vitals shown include unfiled device data.  Last Pain:  Vitals:   05/22/23 0826  TempSrc: Temporal  PainSc: Asleep         Complications: No notable events documented.

## 2023-05-22 NOTE — Anesthesia Preprocedure Evaluation (Addendum)
Anesthesia Evaluation  Patient identified by MRN, date of birth, ID band Patient awake    Reviewed: Allergy & Precautions, H&P , NPO status , Patient's Chart, lab work & pertinent test results  Airway Mallampati: II  TM Distance: >3 FB Neck ROM: full    Dental  (+) Edentulous Upper, Edentulous Lower   Pulmonary COPD, Current SmokerPatient did not abstain from smoking.   Pulmonary exam normal        Cardiovascular negative cardio ROS Normal cardiovascular exam     Neuro/Psych  PSYCHIATRIC DISORDERS   Bipolar Disorder Schizophrenia  negative neurological ROS     GI/Hepatic negative GI ROS,,,(+)     substance abuse  marijuana use  Endo/Other  negative endocrine ROS    Renal/GU negative Renal ROS  negative genitourinary   Musculoskeletal   Abdominal Normal abdominal exam  (+)   Peds  Hematology negative hematology ROS (+)   Anesthesia Other Findings Past Medical History: No date: Bipolar 1 disorder (HCC) No date: COPD (chronic obstructive pulmonary disease) (HCC) No date: Gallstones No date: Pancreatitis     Comment:  questionable mild biliary pancreatitis No date: Schizophrenia, schizo-affective (HCC)  Past Surgical History: No date: APPENDECTOMY 02/28/2012: CHOLECYSTECTOMY     Comment:  Procedure: LAPAROSCOPIC CHOLECYSTECTOMY;  Surgeon: Dalia Heading, MD;  Location: [facility];  Service: General;                Laterality: N/A; 12/21/2019: COLONOSCOPY WITH PROPOFOL; N/A     Comment:  Procedure: COLONOSCOPY WITH PROPOFOL;  Surgeon: Midge Minium, MD;  Location: [facility] ENDOSCOPY;  Service:               Endoscopy;  Laterality: N/A; 07/11/2006: ESOPHAGOGASTRODUODENOSCOPY     Comment:  RMR: Esophageal foreign body (chicken bone), status post              removal, as described above.  Complete               esophagogastroduodenoscopy not carried out 12/21/2019: ESOPHAGOGASTRODUODENOSCOPY  (EGD) WITH PROPOFOL; N/A     Comment:  Procedure: ESOPHAGOGASTRODUODENOSCOPY (EGD) WITH               PROPOFOL;  Surgeon: Midge Minium, MD;  Location: [facility]               ENDOSCOPY;  Service: Endoscopy;  Laterality: N/A; No date: MULTIPLE TOOTH EXTRACTIONS     Comment:  due to decay, nerve damage, edentulous     Reproductive/Obstetrics negative OB ROS                             Anesthesia Physical Anesthesia Plan  ASA: 3  Anesthesia Plan: General   Post-op Pain Management:    Induction: Intravenous  PONV Risk Score and Plan: Propofol infusion and TIVA  Airway Management Planned:   Additional Equipment:   Intra-op Plan:   Post-operative Plan:   Informed Consent: I have reviewed the patients History and Physical, chart, labs and discussed the procedure including the risks, benefits and alternatives for the proposed anesthesia with the patient or authorized representative who has indicated his/her understanding and acceptance.     Dental Advisory Given  Plan Discussed with: CRNA and Surgeon  Anesthesia Plan Comments:  Anesthesia Quick Evaluation

## 2023-05-22 NOTE — Anesthesia Postprocedure Evaluation (Signed)
Anesthesia Post Note  Patient: Isaiah Peterson.  Procedure(s) Performed: ESOPHAGOGASTRODUODENOSCOPY (EGD) WITH PROPOFOL BIOPSY  Patient location during evaluation: Endoscopy Anesthesia Type: General Level of consciousness: awake and alert Pain management: pain level controlled Vital Signs Assessment: post-procedure vital signs reviewed and stable Respiratory status: spontaneous breathing, nonlabored ventilation and respiratory function stable Cardiovascular status: blood pressure returned to baseline and stable Postop Assessment: no apparent nausea or vomiting Anesthetic complications: no   No notable events documented.   Last Vitals:  Vitals:   05/22/23 0836 05/22/23 0846  BP: 103/75 110/70  Pulse: (!) 48 (!) 49  Resp: (!) 22 13  Temp:    SpO2: 100% 100%    Last Pain:  Vitals:   05/22/23 0846  TempSrc:   PainSc: 0-No pain                 Foye Deer

## 2023-05-22 NOTE — Op Note (Signed)
[facility] [place] [facility] Gastroenterology Patient Name: Isaiah Peterson Procedure Date: 05/22/2023 7:31 AM MRN: [id number] Account #: [id number] Date of Birth: [birth year] Admit Type: Outpatient Age: 48 Room: [facility] ENDO ROOM 2 Gender: Male Note Status: Finalized Instrument Name: Upper Endoscope [id number] Procedure:             Upper GI endoscopy Indications:           Abdominal pain Providers:             [name] MD, MD [facility] Medicines:             Monitored Anesthesia Care Complications:         No immediate complications. Procedure:             Pre-Anesthesia Assessment:                        - Prior to the procedure, a History and Physical was                         performed, and patient medications, allergies and                         sensitivities were reviewed. The patient's tolerance                         of previous anesthesia was reviewed.                        - The risks and benefits of the procedure and the                         sedation options and risks were discussed with the                         patient. All questions were answered and informed                         consent was obtained.                        - ASA Grade Assessment: II - A patient with mild                         systemic disease.                        After obtaining informed consent, the endoscope was                         passed under direct vision. Throughout the procedure,                         the patient's blood pressure, pulse, and oxygen                         saturations were monitored continuously. The Endoscope                         was introduced through the mouth, and advanced to the  third part of duodenum. The upper GI endoscopy was                         accomplished with ease. The patient tolerated the                         procedure well. Findings:      The esophagus was normal.      The examined duodenum was  normal.      The cardia and gastric fundus were normal on retroflexion.      The entire examined stomach was normal. Biopsies were taken with a cold       forceps for histology. Impression:            - Normal esophagus.                        - Normal examined duodenum.                        - Normal stomach. Biopsied. Recommendation:        - Discharge patient to home (with escort).                        - Resume previous diet.                        - Continue present medications.                        - Await pathology results.                        - Return to GI office as previously scheduled. Procedure Code(s):     --- Professional ---                        [phone], Esophagogastroduodenoscopy, flexible,                         transoral; with biopsy, single or multiple Diagnosis Code(s):     --- Professional ---                        R10.9, Unspecified abdominal pain CPT copyright 2022 American Medical Association. All rights reserved. The codes documented in this report are preliminary and upon coder review may  be revised to meet current compliance requirements. [name], MD [name] MD, MD 05/22/2023 8:20:11 AM This report has been signed electronically. Number of Addenda: 0 Note Initiated On: 05/22/2023 7:31 AM Estimated Blood Loss:  Estimated blood loss: none.      [facility]

## 2023-05-22 NOTE — H&P (Signed)
[name], MD [address], [place], [place], [address] [address], [address], [place], [place], [address] Phone: [phone]  Fax: [phone]  Primary Care Physician:  [facility], [facility]   Pre-Procedure History & Physical: HPI:  Isaiah Peterson. is a 48 y.o. male is here for an endoscopy    Past Medical History:  Diagnosis Date   Bipolar 1 disorder (HCC)    COPD (chronic obstructive pulmonary disease) (HCC)    Gallstones    Pancreatitis    questionable mild biliary pancreatitis   Schizophrenia, schizo-affective (HCC)     Past Surgical History:  Procedure Laterality Date   APPENDECTOMY     CHOLECYSTECTOMY  02/28/2012   Procedure: LAPAROSCOPIC CHOLECYSTECTOMY;  Surgeon: Dalia Heading, MD;  Location: [facility];  Service: General;  Laterality: N/A;   COLONOSCOPY WITH PROPOFOL N/A 12/21/2019   Procedure: COLONOSCOPY WITH PROPOFOL;  Surgeon: Midge Minium, MD;  Location: [facility] ENDOSCOPY;  Service: Endoscopy;  Laterality: N/A;   ESOPHAGOGASTRODUODENOSCOPY  07/11/2006   RMR: Esophageal foreign body (chicken bone), status post removal, as described above.  Complete esophagogastroduodenoscopy not carried out   ESOPHAGOGASTRODUODENOSCOPY (EGD) WITH PROPOFOL N/A 12/21/2019   Procedure: ESOPHAGOGASTRODUODENOSCOPY (EGD) WITH PROPOFOL;  Surgeon: Midge Minium, MD;  Location: [facility] ENDOSCOPY;  Service: Endoscopy;  Laterality: N/A;   MULTIPLE TOOTH EXTRACTIONS     due to decay, nerve damage, edentulous    Prior to Admission medications   Medication Sig Start Date End Date Taking? Authorizing Provider  benztropine (COGENTIN) 1 MG tablet  11/10/19   [provider]  lamoTRIgine (LAMICTAL) 100 MG tablet Take 200 mg by mouth at bedtime. 11/02/21   [provider]  LORazepam (ATIVAN) 1 MG tablet Take 1 mg by mouth every 8 (eight) hours.    [provider]  lumateperone tosylate (CAPLYTA) 42 MG capsule Take 42 mg by mouth daily.     [provider]  mirtazapine (REMERON) 30 MG tablet Take 30 mg by mouth at bedtime. 12/03/21   [provider]  pantoprazole (PROTONIX) 40 MG tablet Take 40 mg by mouth daily.    [provider]  sucralfate (CARAFATE) 1 g tablet Take 1 tablet (1 g total) by mouth 3 (three) times daily before meals. 05/01/23 10/28/23  Celso Amy, PA-C    Allergies as of 05/01/2023 - Review Complete 05/01/2023  Allergen Reaction Noted   Haldol [haloperidol lactate] Other (See Comments) 02/19/2012    Family History  Problem Relation Age of Onset   Aneurysm Mother        brain   Liver disease Mother    Colon cancer Maternal Uncle        great uncle   Pancreatitis Maternal Uncle        died age 87   Stomach cancer Maternal Aunt        great aunt    Social History   Socioeconomic History   Marital status: Single    Spouse name: Not on file   Number of children: Not on file   Years of education: Not on file   Highest education level: Not on file  Occupational History   Not on file  Tobacco Use   Smoking status: Every Day    Current packs/day: 1.50    Average packs/day: 1.5 packs/day for 20.0 years (30.0 ttl pk-yrs)    Types: Cigarettes   Smokeless tobacco: Never  Vaping Use   Vaping status: Never Used  Substance and  Sexual Activity   Alcohol use: No    Comment: former quit about 10 years ago   Drug use: Yes    Types: Marijuana    Comment: marijuana end of Jan 2014    Sexual activity: Yes  Other Topics Concern   Not on file  Social History Narrative   Not on file   Social Drivers of Health   Financial Resource Strain: Not on file  Food Insecurity: Not on file  Transportation Needs: Not on file  Physical Activity: Not on file  Stress: Not on file  Social Connections: Not on file  Intimate Partner Violence: Not on file    Review of Systems: See HPI, otherwise negative ROS  Physical Exam: BP (!) 106/58   Pulse 64   Temp (!) 97.3 F (36.3 C)  (Temporal)   Resp 17   Wt 60.1 kg   SpO2 100%   BMI 18.47 kg/m  General:   Alert,  pleasant and cooperative in NAD Head:  Normocephalic and atraumatic. Neck:  Supple; no masses or thyromegaly. Lungs:  Clear throughout to auscultation, normal respiratory effort.    Heart:  +S1, +S2, Regular rate and rhythm, No edema. Abdomen:  Soft, nontender and nondistended. Normal bowel sounds, without guarding, and without rebound.   Neurologic:  Alert and  oriented x4;  grossly normal neurologically.  Impression/Plan: Isaiah Peterson. is here for an endoscopy  to be performed for  evaluation of abdominal pain     Risks, benefits, limitations, and alternatives regarding endoscopy have been reviewed with the patient.  Questions have been answered.  All parties agreeable.   [name], MD  05/22/2023, 8:12 AM

## 2023-05-23 ENCOUNTER — Encounter: Payer: Self-pay | Admitting: Gastroenterology

## 2023-05-23 LAB — SURGICAL PATHOLOGY

## 2023-05-26 ENCOUNTER — Encounter: Payer: Self-pay | Admitting: Gastroenterology

## 2023-06-16 ENCOUNTER — Other Ambulatory Visit: Payer: Self-pay

## 2023-06-19 ENCOUNTER — Ambulatory Visit: Payer: 59 | Admitting: Physician Assistant

## 2023-06-19 ENCOUNTER — Encounter: Payer: Self-pay | Admitting: Physician Assistant

## 2023-06-19 VITALS — BP 117/70 | HR 78 | Temp 97.7°F | Ht 71.0 in | Wt 131.2 lb

## 2023-06-19 DIAGNOSIS — K219 Gastro-esophageal reflux disease without esophagitis: Secondary | ICD-10-CM

## 2023-06-19 DIAGNOSIS — Z860100 Personal history of colon polyps, unspecified: Secondary | ICD-10-CM | POA: Diagnosis not present

## 2023-06-19 DIAGNOSIS — K2950 Unspecified chronic gastritis without bleeding: Secondary | ICD-10-CM | POA: Diagnosis not present

## 2023-06-19 MED ORDER — PANTOPRAZOLE SODIUM 40 MG PO TBEC: 40.0000 mg | DELAYED_RELEASE_TABLET | Freq: Every day | ORAL | 3 refills | Status: AC

## 2023-06-19 MED ORDER — SUCRALFATE 1 G PO TABS: 1.0000 g | ORAL_TABLET | Freq: Three times a day (TID) | ORAL | 5 refills | Status: AC

## 2023-06-19 NOTE — Progress Notes (Signed)
 Celso Amy, PA-C [address]  [address]  [place], [place] [address]  Main: [phone]  Fax: [phone]   Primary Care Physician: [facility]  Primary Gastroenterologist:  Celso Amy, PA-C   CC: Follow-up Chronic Epigastric pain.  HPI: Divonte Senger. is a 48 y.o. male returns for 6-week follow-up of epigastric pain.  Prior cholecystectomy in 2013.  Currently taking pantoprazole 40 Mg twice daily and Carafate 1 g 3 times daily.  He was advised to avoid NSAIDs.  Stopped Pepto-Bismol which was causing dark stools.  No upper abdominal pain has improved.  He also takes Tylenol which helps.  No GI concerns today.  05/05/2023 labs: CMP and lipase were normal.   05/22/2023 EGD by Dr. Tobi Bastos: Normal.  Gastric biopsies negative for H. pylori.  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    12/2021 abdominal pelvic CT with contrast  thymic hyperplasia / remnant.  Enlarged prostate.  No other abnormality to explain weight loss.   Medical history significant for bipolar disorder, COPD, schizophrenia. Patient saw hematologist Dr. Cathie Hoops to evaluate chronic thrombocytopenia, weight loss, mediastinal mass. Last follow-up 07/2022 with Dr. Cathie Hoops. Chronic thrombocytopenia, counts are stable at his baseline. Negative peripheral blood flow cytometry, negative multiple myeloma panel, normal thyroid function.   12/2019 colonoscopy by Dr. Servando Snare: 2 small 3 mm tubular adenoma, 4 mm hyperplastic polyps removed.  Excellent prep.  7 year repeat.   12/2019 EGD by Dr. Servando Snare: Moderate gastritis, otherwise normal.  Biopsies negative for H. pylori, celiac, and dysplasia.  Biopsy showed moderate chronic gastritis.  Current Outpatient Medications  Medication Sig Dispense Refill   benztropine (COGENTIN) 1 MG tablet      INVEGA TRINZA 819 MG/2.63ML injection SMARTSIG:2.625 Milliliter(s) IM Every 3 Months     lamoTRIgine (LAMICTAL) 100 MG tablet Take 200 mg by mouth at  bedtime.     LORazepam (ATIVAN) 1 MG tablet Take 1 mg by mouth every 8 (eight) hours.     lumateperone tosylate (CAPLYTA) 42 MG capsule Take 42 mg by mouth daily.     mirtazapine (REMERON) 30 MG tablet Take 30 mg by mouth at bedtime.     UZEDY 200 MG/0.56ML SUSY SMARTSIG:200 SUB-Q Once a Month     UZEDY 250 MG/0.7ML SUSY SMARTSIG:2 SUB-Q Every 3 Months     pantoprazole (PROTONIX) 40 MG tablet Take 1 tablet (40 mg total) by mouth daily. 90 tablet 3   sucralfate (CARAFATE) 1 g tablet Take 1 tablet (1 g total) by mouth 3 (three) times daily before meals. 90 tablet 5   No current facility-administered medications for this visit.    Allergies as of 06/19/2023 - Review Complete 06/19/2023  Allergen Reaction Noted   Haldol [haloperidol lactate] Other (See Comments) 02/19/2012    Past Medical History:  Diagnosis Date   Bipolar 1 disorder (HCC)    COPD (chronic obstructive pulmonary disease) (HCC)    Gallstones    Pancreatitis    questionable mild biliary pancreatitis   Schizophrenia, schizo-affective (HCC)     Past Surgical History:  Procedure Laterality Date   APPENDECTOMY     BIOPSY  05/22/2023   Procedure: BIOPSY;  Surgeon: [name], MD;  Location: [facility] ENDOSCOPY;  Service: Gastroenterology;;   Warren Callas  02/28/2012   Procedure: LAPAROSCOPIC CHOLECYSTECTOMY;  Surgeon: Dalia Heading, MD;  Location: [facility];  Service: General;  Laterality: N/A;   COLONOSCOPY WITH PROPOFOL N/A 12/21/2019   Procedure: COLONOSCOPY WITH PROPOFOL;  Surgeon: Midge Minium, MD;  Location: [facility] ENDOSCOPY;  Service: Endoscopy;  Laterality: N/A;   ESOPHAGOGASTRODUODENOSCOPY  07/11/2006   RMR: Esophageal foreign body (chicken bone), status post removal, as described above.  Complete esophagogastroduodenoscopy not carried out   ESOPHAGOGASTRODUODENOSCOPY (EGD) WITH PROPOFOL N/A 12/21/2019   Procedure: ESOPHAGOGASTRODUODENOSCOPY (EGD) WITH PROPOFOL;  Surgeon: Midge Minium, MD;  Location: [facility] ENDOSCOPY;   Service: Endoscopy;  Laterality: N/A;   ESOPHAGOGASTRODUODENOSCOPY (EGD) WITH PROPOFOL N/A 05/22/2023   Procedure: ESOPHAGOGASTRODUODENOSCOPY (EGD) WITH PROPOFOL;  Surgeon: [name], MD;  Location: [facility] ENDOSCOPY;  Service: Gastroenterology;  Laterality: N/A;   MULTIPLE TOOTH EXTRACTIONS     due to decay, nerve damage, edentulous    Review of Systems:    All systems reviewed and negative except where noted in HPI.   Physical Examination:   BP 117/70   Pulse 78   Temp 97.7 F (36.5 C)   Ht 5\' 11"  (1.803 m)   Wt 131 lb 3.2 oz (59.5 kg)   BMI 18.30 kg/m   General: Well-nourished, well-developed in no acute distress.  Neuro: Alert and oriented x 3.  Grossly intact.  Psych: Alert and cooperative, normal mood and affect.   Imaging Studies: No results found.  Assessment and Plan:   Bartosz Luginbill. is a 48 y.o. y/o male returns for 6-week follow-up of chronic epigastric pain.  Currently improved on pantoprazole and Carafate.  Recent EGD was normal.  Reassurance.  1.  GERD  Avoid NSAIDs.  Recommend Lifestyle Modifications to prevent Acid Reflux.  Rec. Avoid coffee, sodas, peppermint, garlic, onions, alcohol, citrus fruits, chocolate, tomatoes, fatty and spicey foods.  Avoid eating 2-3 hours before bedtime.    2.  Chronic gastritis (H. pylori negative)  Continue pantoprazole 40 Mg daily.  Continue sucralfate 1 g 3 times daily as needed.  3.  History of colon polyps  7-year repeat colonoscopy will be due 12/2026.  Celso Amy, PA-C  Follow up as needed.

## 2023-07-15 ENCOUNTER — Encounter: Payer: Self-pay | Admitting: Oncology

## 2023-07-15 ENCOUNTER — Inpatient Hospital Stay: Payer: 59 | Attending: Oncology

## 2023-07-15 ENCOUNTER — Inpatient Hospital Stay (HOSPITAL_BASED_OUTPATIENT_CLINIC_OR_DEPARTMENT_OTHER): Payer: 59 | Admitting: Oncology

## 2023-07-15 VITALS — BP 104/71 | HR 55 | Temp 96.0°F | Resp 18 | Wt 133.9 lb

## 2023-07-15 DIAGNOSIS — Z800 Family history of malignant neoplasm of digestive organs: Secondary | ICD-10-CM | POA: Diagnosis not present

## 2023-07-15 DIAGNOSIS — D696 Thrombocytopenia, unspecified: Secondary | ICD-10-CM | POA: Diagnosis present

## 2023-07-15 DIAGNOSIS — F319 Bipolar disorder, unspecified: Secondary | ICD-10-CM | POA: Diagnosis not present

## 2023-07-15 DIAGNOSIS — Z8379 Family history of other diseases of the digestive system: Secondary | ICD-10-CM | POA: Diagnosis not present

## 2023-07-15 DIAGNOSIS — Z888 Allergy status to other drugs, medicaments and biological substances status: Secondary | ICD-10-CM | POA: Insufficient documentation

## 2023-07-15 DIAGNOSIS — Z8249 Family history of ischemic heart disease and other diseases of the circulatory system: Secondary | ICD-10-CM | POA: Diagnosis not present

## 2023-07-15 DIAGNOSIS — F17210 Nicotine dependence, cigarettes, uncomplicated: Secondary | ICD-10-CM | POA: Diagnosis not present

## 2023-07-15 DIAGNOSIS — Z79899 Other long term (current) drug therapy: Secondary | ICD-10-CM | POA: Insufficient documentation

## 2023-07-15 DIAGNOSIS — F209 Schizophrenia, unspecified: Secondary | ICD-10-CM | POA: Diagnosis not present

## 2023-07-15 DIAGNOSIS — E320 Persistent hyperplasia of thymus: Secondary | ICD-10-CM | POA: Diagnosis not present

## 2023-07-15 DIAGNOSIS — R61 Generalized hyperhidrosis: Secondary | ICD-10-CM | POA: Diagnosis not present

## 2023-07-15 DIAGNOSIS — Z9049 Acquired absence of other specified parts of digestive tract: Secondary | ICD-10-CM | POA: Insufficient documentation

## 2023-07-15 LAB — CMP (CANCER CENTER ONLY)
ALT: 9 U/L (ref 0–44)
AST: 18 U/L (ref 15–41)
Albumin: 4 g/dL (ref 3.5–5.0)
Alkaline Phosphatase: 48 U/L (ref 38–126)
Anion gap: 8 (ref 5–15)
BUN: 6 mg/dL (ref 6–20)
CO2: 24 mmol/L (ref 22–32)
Calcium: 8.6 mg/dL — ABNORMAL LOW (ref 8.9–10.3)
Chloride: 105 mmol/L (ref 98–111)
Creatinine: 1.05 mg/dL (ref 0.61–1.24)
GFR, Estimated: >60 mL/min (ref >60)
Glucose, Bld: 83 mg/dL (ref 70–99)
Potassium: 4.1 mmol/L (ref 3.5–5.1)
Sodium: 137 mmol/L (ref 135–145)
Total Bilirubin: 0.3 mg/dL (ref 0.0–1.2)
Total Protein: 6.4 g/dL — ABNORMAL LOW (ref 6.5–8.1)

## 2023-07-15 LAB — CBC WITH DIFFERENTIAL (CANCER CENTER ONLY)
Abs Immature Granulocytes: 0.03 10*3/uL (ref 0.00–0.07)
Basophils Absolute: 0 10*3/uL (ref 0.0–0.1)
Basophils Relative: 1 %
Eosinophils Absolute: 0.2 10*3/uL (ref 0.0–0.5)
Eosinophils Relative: 2 %
HCT: 38.9 % — ABNORMAL LOW (ref 39.0–52.0)
Hemoglobin: 13.7 g/dL (ref 13.0–17.0)
Immature Granulocytes: 0 %
Lymphocytes Relative: 17 %
Lymphs Abs: 1.4 10*3/uL (ref 0.7–4.0)
MCH: 33.3 pg (ref 26.0–34.0)
MCHC: 35.2 g/dL (ref 30.0–36.0)
MCV: 94.6 fL (ref 80.0–100.0)
Monocytes Absolute: 0.6 10*3/uL (ref 0.1–1.0)
Monocytes Relative: 7 %
Neutro Abs: 6 10*3/uL (ref 1.7–7.7)
Neutrophils Relative %: 73 %
Platelet Count: 128 10*3/uL — ABNORMAL LOW (ref 150–400)
RBC: 4.11 MIL/uL — ABNORMAL LOW (ref 4.22–5.81)
RDW: 12.3 % (ref 11.5–15.5)
WBC Count: 8.2 10*3/uL (ref 4.0–10.5)
nRBC: 0 % (ref 0.0–0.2)

## 2023-07-15 LAB — IMMATURE PLATELET FRACTION: Immature Platelet Fraction: 5.9 % (ref 1.2–8.6)

## 2023-07-15 NOTE — Assessment & Plan Note (Signed)
 Labs reviewed and discussed with patient. Chronic thrombocytopenia, counts are stable at his baseline. Negative peripheral blood flow cytometry, negative multiple myeloma panel, normal thyroid function.  Normal LDH, adequate B12 and folate level.  Ultrasound showed no hepatosplenomegaly.  Labs reviewed and discussed with patient. Lab Results  Component Value Date   PLT 128 (L) 07/15/2023   PLT 134 (L) 07/15/2022   PLT 134 (L) 11/12/2021  Counts are stable.  I recommend observation.

## 2023-07-15 NOTE — Assessment & Plan Note (Addendum)
 MRI and CT scans were reviewed and discussed with patient.  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 He was referred to see neurologist for evaluation of MG and he did not establish care.  Today he denies muscle weakness.

## 2023-07-15 NOTE — Progress Notes (Signed)
 Hematology/Oncology Progress note [phone] [phone]     CHIEF COMPLAINTS/REASON FOR VISIT:  Thrombocytopenia, weight loss, mediastinal mass  ASSESSMENT & PLAN:   Thrombocytopenia (HCC) Labs reviewed and discussed with patient. Chronic thrombocytopenia, counts are stable at his baseline. Negative peripheral blood flow cytometry, negative multiple myeloma panel, normal thyroid function.  Normal LDH, adequate B12 and folate level.  Ultrasound showed no hepatosplenomegaly.  Labs reviewed and discussed with patient. Lab Results  Component Value Date   PLT 128 (L) 07/15/2023   PLT 134 (L) 07/15/2022   PLT 134 (L) 11/12/2021  Counts are stable.  I recommend observation.  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 He was referred to see neurologist for evaluation of MG and he did not establish care.  Today he denies muscle weakness.   Hypocalcemia Recommend calcium carbonate 600mg  daily. Otc supply   Orders Placed This Encounter  Procedures   CMP (Cancer Center only)    Standing Status:   Future    Expected Date:   07/14/2024    Expiration Date:   07/14/2024   CBC with Differential (Cancer Center Only)    Standing Status:   Future    Expected Date:   07/14/2024    Expiration Date:   07/14/2024   Follow-up in 1 year. All questions were answered. The patient knows to call the clinic with any problems, questions or concerns.  Rickard Patience, MD, PhD [facility] Hematology Oncology 07/15/2023    HISTORY OF PRESENTING ILLNESS:  Isaiah Peterson. is a 48 y.o. male who was seen in consultation at the request of [facility] for evaluation of thrombocytopenia   Patient had abnormal Labs which showed decreased platelet counts at 117.  Reviewed patient's previous labs. Thrombocytopenia is chronic chronic onset , since at least 2009.  Denies fever,  chills, fatigue, patient reports weight loss, he only eats 1 meal per day due to lack of appetite. + Night sweats.  Patient lives by himself.  He has schizophrenia, he  has quetiapine, Congentin and Invega, Lamotrigen on his medication list.  Patient reports that medication is controlling his symptoms very well.  Occasionally he does have audio hallucination.  Sometimes he sees shadows. no thoughts of hurting himself or hurting others. Denies hematochezia, hematuria, hematemesis, epistaxis, black tarry stool.  Patient has no easy bruising.    Denies history hepatitis or HIV infection Denies history of chronic liver disease Denies routine alcohol consumption.  Patient used to drink alcohol and quit 10 years ago. Denies dietary restrictions.  Denies herbal medication  INTERVAL HISTORY Isaiah Peterson. is a 48 y.o. male who has above history reviewed by me today presents for follow up visit for thrombocytopenia Weight is stable.  He reports appetite is fair. He denies extremity weakness.     MEDICAL HISTORY:  Past Medical History:  Diagnosis Date   Bipolar 1 disorder (HCC)    COPD (chronic obstructive pulmonary disease) (HCC)    Gallstones    Pancreatitis    questionable mild biliary pancreatitis   Schizophrenia, schizo-affective (HCC)     SURGICAL HISTORY: Past Surgical History:  Procedure Laterality Date   APPENDECTOMY     BIOPSY  05/22/2023   Procedure: BIOPSY;  Surgeon: [name], MD;  Location: [facility] ENDOSCOPY;  Service: Gastroenterology;;   Bend Callas  02/28/2012   Procedure: LAPAROSCOPIC CHOLECYSTECTOMY;  Surgeon: Dalia Heading, MD;  [facility];  Service: General;  Laterality: N/A;   COLONOSCOPY WITH PROPOFOL N/A 12/21/2019   Procedure: COLONOSCOPY WITH PROPOFOL;  Surgeon: Midge Minium, MD;  Location: [facility] ENDOSCOPY;  Service: Endoscopy;  Laterality: N/A;   ESOPHAGOGASTRODUODENOSCOPY  07/11/2006   RMR: Esophageal foreign body (chicken bone), status post  removal, as described above.  Complete esophagogastroduodenoscopy not carried out   ESOPHAGOGASTRODUODENOSCOPY (EGD) WITH PROPOFOL N/A 12/21/2019   Procedure: ESOPHAGOGASTRODUODENOSCOPY (EGD) WITH PROPOFOL;  Surgeon: Midge Minium, MD;  Location: [facility] ENDOSCOPY;  Service: Endoscopy;  Laterality: N/A;   ESOPHAGOGASTRODUODENOSCOPY (EGD) WITH PROPOFOL N/A 05/22/2023   Procedure: ESOPHAGOGASTRODUODENOSCOPY (EGD) WITH PROPOFOL;  Surgeon: [name], MD;  Location: [facility] ENDOSCOPY;  Service: Gastroenterology;  Laterality: N/A;   MULTIPLE TOOTH EXTRACTIONS     due to decay, nerve damage, edentulous    SOCIAL HISTORY: Social History   Socioeconomic History   Marital status: Single    Spouse name: Not on file   Number of children: Not on file   Years of education: Not on file   Highest education level: Not on file  Occupational History   Not on file  Tobacco Use   Smoking status: Every Day    Current packs/day: 1.50    Average packs/day: 1.5 packs/day for 20.0 years (30.0 ttl pk-yrs)    Types: Cigarettes   Smokeless tobacco: Never  Vaping Use   Vaping status: Never Used  Substance and Sexual Activity   Alcohol use: No    Comment: former quit about 10 years ago   Drug use: Yes    Types: Marijuana    Comment: marijuana end of Jan 2014    Sexual activity: Yes  Other Topics Concern   Not on file  Social History Narrative   Not on file   Social Drivers of Health   Financial Resource Strain: Not on file  Food Insecurity: Not on file  Transportation Needs: Not on file  Physical Activity: Not on file  Stress: Not on file  Social Connections: Not on file  Intimate Partner Violence: Not on file    FAMILY HISTORY: Family History  Problem Relation Age of Onset   Aneurysm Mother        brain   Liver disease Mother    Colon cancer Maternal Uncle        great uncle   Pancreatitis Maternal Uncle        died age 61   Stomach cancer Maternal Aunt        great aunt    ALLERGIES:  is  allergic to haldol [haloperidol lactate].  MEDICATIONS:  Current Outpatient Medications  Medication Sig Dispense Refill   benztropine (COGENTIN) 1 MG tablet      INVEGA TRINZA 819 MG/2.63ML injection SMARTSIG:2.625 Milliliter(s) IM Every 3 Months     lamoTRIgine (LAMICTAL) 100 MG tablet Take 200 mg by mouth at bedtime.     LORazepam (ATIVAN) 1 MG tablet Take 1 mg by mouth every 8 (eight) hours.     lumateperone tosylate (CAPLYTA) 42 MG capsule Take 42 mg by mouth daily.     mirtazapine (REMERON) 30 MG tablet Take 30 mg by mouth at bedtime.     pantoprazole (PROTONIX) 40 MG tablet Take 1 tablet (40 mg total) by mouth daily. 90 tablet 3   sucralfate (CARAFATE) 1 g tablet Take 1 tablet (1 g total) by mouth 3 (three) times daily before meals. 90 tablet 5   UZEDY 200 MG/0.56ML SUSY SMARTSIG:200 SUB-Q Once a Month  UZEDY 250 MG/0.7ML SUSY SMARTSIG:2 SUB-Q Every 3 Months     No current facility-administered medications for this visit.    Review of Systems  Constitutional:  Positive for fatigue. Negative for appetite change, chills, fever and unexpected weight change.  HENT:   Negative for hearing loss and voice change.   Eyes:  Negative for eye problems and icterus.  Respiratory:  Negative for chest tightness, cough and shortness of breath.   Cardiovascular:  Negative for chest pain and leg swelling.  Gastrointestinal:  Negative for abdominal distention and abdominal pain.  Endocrine: Negative for hot flashes.  Genitourinary:  Negative for difficulty urinating, dysuria and frequency.   Musculoskeletal:  Negative for arthralgias.  Skin:  Negative for itching and rash.  Neurological:  Negative for extremity weakness, light-headedness and numbness.  Hematological:  Negative for adenopathy. Does not bruise/bleed easily.  Psychiatric/Behavioral:  Negative for confusion.     PHYSICAL EXAMINATION: ECOG PERFORMANCE STATUS: 1 - Symptomatic but completely ambulatory Vitals:   07/15/23 1320   BP: 104/71  Pulse: (!) 55  Resp: 18  Temp: (!) 96 F (35.6 C)  SpO2: 98%   Filed Weights   07/15/23 1320  Weight: 133 lb 14.4 oz (60.7 kg)    Physical Exam Constitutional:      General: He is not in acute distress. HENT:     Head: Normocephalic and atraumatic.     Mouth/Throat:     Comments: edentulous Eyes:     General: No scleral icterus. Cardiovascular:     Rate and Rhythm: Normal rate and regular rhythm.     Heart sounds: Normal heart sounds.  Pulmonary:     Effort: Pulmonary effort is normal. No respiratory distress.     Breath sounds: No wheezing.  Abdominal:     General: Bowel sounds are normal. There is no distension.     Palpations: Abdomen is soft.  Musculoskeletal:        General: No deformity. Normal range of motion.     Cervical back: Normal range of motion and neck supple.  Skin:    General: Skin is warm and dry.     Findings: No erythema or rash.  Neurological:     Mental Status: He is alert and oriented to person, place, and time. Mental status is at baseline.     Cranial Nerves: No cranial nerve deficit.     Coordination: Coordination normal.  Psychiatric:        Mood and Affect: Mood normal.      LABORATORY DATA:  I have reviewed the data as listed    Latest Ref Rng & Units 07/15/2023   12:31 PM 07/15/2022   12:53 PM 11/12/2021   12:17 PM  CBC  WBC 4.0 - 10.5 K/uL 8.2  5.9  7.0   Hemoglobin 13.0 - 17.0 g/dL 16.1  09.6  04.5   Hematocrit 39.0 - 52.0 % 38.9  38.5  41.2   Platelets 150 - 400 K/uL 128  134  134       Latest Ref Rng & Units 07/15/2023   12:31 PM 05/05/2023    4:03 PM 07/15/2022   12:53 PM  CMP  Glucose 70 - 99 mg/dL 83  409  91   BUN 6 - 20 mg/dL 6  13  7    Creatinine 0.61 - 1.24 mg/dL 8.11  9.14  7.82   Sodium 135 - 145 mmol/L 137  141  137   Potassium 3.5 - 5.1 mmol/L 4.1  4.5  3.9   Chloride 98 - 111 mmol/L 105  103  106   CO2 22 - 32 mmol/L 24  20  25    Calcium 8.9 - 10.3 mg/dL 8.6  9.6  8.8   Total Protein 6.5 - 8.1  g/dL 6.4  6.6  6.6   Total Bilirubin 0.0 - 1.2 mg/dL 0.3  0.3  0.4   Alkaline Phos 38 - 126 U/L 48  56  47   AST 15 - 41 U/L 18  12  15    ALT 0 - 44 U/L 9  7  8       RADIOGRAPHIC STUDIES: I have personally reviewed the radiological images as listed and agreed with the findings in the report.  No results found.

## 2023-07-15 NOTE — Assessment & Plan Note (Signed)
 Recommend calcium carbonate 600mg  daily. [facility] supply

## 2024-07-14 ENCOUNTER — Ambulatory Visit: Admitting: Oncology

## 2024-07-14 ENCOUNTER — Other Ambulatory Visit
